# Patient Record
Sex: Female | Born: 1995 | Race: Black or African American | Hispanic: No | Smoking: Current some day smoker
Health system: Southern US, Community
[De-identification: ages and names within clinical notes are randomized; demographics above are authoritative.]

## PROBLEM LIST (undated history)

## (undated) DIAGNOSIS — R569 Unspecified convulsions: Secondary | ICD-10-CM

## (undated) DIAGNOSIS — H547 Unspecified visual loss: Secondary | ICD-10-CM

## (undated) DIAGNOSIS — H409 Unspecified glaucoma: Secondary | ICD-10-CM

## (undated) DIAGNOSIS — Q150 Congenital glaucoma: Secondary | ICD-10-CM

## (undated) DIAGNOSIS — E876 Hypokalemia: Secondary | ICD-10-CM

## (undated) HISTORY — DX: Unspecified glaucoma: H40.9

## (undated) HISTORY — PX: EYE SURGERY: SHX253

---

## 2019-09-17 ENCOUNTER — Emergency Department (HOSPITAL_COMMUNITY)
Admission: EM | Admit: 2019-09-17 | Discharge: 2019-09-17 | Disposition: A | Discharge status: Home / Self Care | Payer: Medicaid - Out of State | Attending: Emergency Medicine | Admitting: Emergency Medicine

## 2019-09-17 ENCOUNTER — Encounter (HOSPITAL_COMMUNITY): Payer: Self-pay

## 2019-09-17 ENCOUNTER — Other Ambulatory Visit: Payer: Self-pay

## 2019-09-17 DIAGNOSIS — Z0279 Encounter for issue of other medical certificate: Secondary | ICD-10-CM | POA: Insufficient documentation

## 2019-09-17 DIAGNOSIS — R4182 Altered mental status, unspecified: Secondary | ICD-10-CM | POA: Diagnosis present

## 2019-09-17 DIAGNOSIS — F10920 Alcohol use, unspecified with intoxication, uncomplicated: Secondary | ICD-10-CM | POA: Insufficient documentation

## 2019-09-17 HISTORY — DX: Unspecified visual loss: H54.7

## 2019-09-17 LAB — RAPID URINE DRUG SCREEN, HOSP PERFORMED
Amphetamines: NOT DETECTED
Barbiturates: NOT DETECTED
Benzodiazepines: POSITIVE — AB
Cocaine: NOT DETECTED
Opiates: NOT DETECTED
Tetrahydrocannabinol: NOT DETECTED

## 2019-09-17 LAB — CBC
HCT: 32.1 % — ABNORMAL LOW (ref 36.0–46.0)
Hemoglobin: 9.4 g/dL — ABNORMAL LOW (ref 12.0–15.0)
MCH: 21.4 pg — ABNORMAL LOW (ref 26.0–34.0)
MCHC: 29.3 g/dL — ABNORMAL LOW (ref 30.0–36.0)
MCV: 73.1 fL — ABNORMAL LOW (ref 80.0–100.0)
Platelets: 441 10*3/uL — ABNORMAL HIGH (ref 150–400)
RBC: 4.39 MIL/uL (ref 3.87–5.11)
RDW: 19.3 % — ABNORMAL HIGH (ref 11.5–15.5)
WBC: 6.5 10*3/uL (ref 4.0–10.5)
nRBC: 0 % (ref 0.0–0.2)

## 2019-09-17 LAB — COMPREHENSIVE METABOLIC PANEL
ALT: 14 U/L (ref 0–44)
AST: 22 U/L (ref 15–41)
Albumin: 4.1 g/dL (ref 3.5–5.0)
Alkaline Phosphatase: 69 U/L (ref 38–126)
Anion gap: 10 (ref 5–15)
BUN: 11 mg/dL (ref 6–20)
CO2: 24 mmol/L (ref 22–32)
Calcium: 8.9 mg/dL (ref 8.9–10.3)
Chloride: 107 mmol/L (ref 98–111)
Creatinine, Ser: 0.52 mg/dL (ref 0.44–1.00)
GFR calc Af Amer: >60 mL/min (ref >60)
GFR calc non Af Amer: >60 mL/min (ref >60)
Glucose, Bld: 99 mg/dL (ref 70–99)
Potassium: 3.1 mmol/L — ABNORMAL LOW (ref 3.5–5.1)
Sodium: 141 mmol/L (ref 135–145)
Total Bilirubin: 0.2 mg/dL — ABNORMAL LOW (ref 0.3–1.2)
Total Protein: 7.7 g/dL (ref 6.5–8.1)

## 2019-09-17 LAB — I-STAT BETA HCG BLOOD, ED (MC, WL, AP ONLY): I-stat hCG, quantitative: <5 m[IU]/mL (ref <5)

## 2019-09-17 LAB — CBG MONITORING, ED: Glucose-Capillary: 104 mg/dL — ABNORMAL HIGH (ref 70–99)

## 2019-09-17 LAB — ACETAMINOPHEN LEVEL: Acetaminophen (Tylenol), Serum: <10 ug/mL — ABNORMAL LOW (ref 10–30)

## 2019-09-17 LAB — ETHANOL: Alcohol, Ethyl (B): 204 mg/dL — ABNORMAL HIGH (ref <10)

## 2019-09-17 LAB — SALICYLATE LEVEL: Salicylate Lvl: <7 mg/dL — ABNORMAL LOW (ref 7.0–30.0)

## 2019-09-17 MED ORDER — SODIUM CHLORIDE 0.9 % IV BOLUS (SEPSIS)
1000.0000 mL | Freq: Once | INTRAVENOUS | Status: AC
  Administered 2019-09-17: 1000 mL via INTRAVENOUS

## 2019-09-17 MED ORDER — SODIUM CHLORIDE 0.9 % IV SOLN
1000.0000 mL | INTRAVENOUS | Status: DC
  Administered 2019-09-17: 1000 mL via INTRAVENOUS

## 2019-09-17 NOTE — ED Provider Notes (Signed)
7:30 AM-checkout from Dr. Eudelia Bunch, to evaluate patient for sobriety following a period of observation.  Patient reported drinking alcohol, EMS summoned, she was sedated for abnormal behavior which was apparently violent.  Clinical Course as of Sep 16 1057  Sun Sep 17, 2019  1057 Normal  I-Stat beta hCG blood, ED [EW]  1057 Normal except potassium low  Comprehensive metabolic panel(!) [EW]  1057 Normal  Salicylate level(!) [EW]  1057 Normal except hemoglobin low, MCV low, RBC indices abnormal, and platelets mildly elevated  cbc(!) [EW]  1057 Normal  Acetaminophen level(!) [EW]  1057 Abnormal, elevated  Ethanol(!) [EW]  1058 Normal except presence of benzodiazepines.  Rapid urine drug screen (hospital performed)(!) [EW]    Clinical Course User Index [EW] Mancel Bale, MD   11:20 AM-at this time she is sitting up, alert and conversant with normal vital signs.  She states that her "girlfriend," is here and wants to take her home.  The girlfriend is here, and appears sober and states that she will take care of the patient.  Patient will be discharged at this time.   Mancel Bale, MD 09/17/19 1121
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
## 2019-09-17 NOTE — Discharge Instructions (Addendum)
Avoid drinking large amounts of alcohol

## 2019-09-17 NOTE — ED Notes (Signed)
Patient has been calm and cooperative since being here. MD notified of patient's behavior. MD stated that the restraints placed on patient by EMS may be removed.

## 2019-12-23 ENCOUNTER — Other Ambulatory Visit: Payer: Self-pay

## 2019-12-23 ENCOUNTER — Emergency Department (HOSPITAL_COMMUNITY)
Admission: EM | Admit: 2019-12-23 | Discharge: 2019-12-23 | Disposition: A | Discharge status: Home / Self Care | Payer: Medicaid - Out of State | Attending: Emergency Medicine | Admitting: Emergency Medicine

## 2019-12-23 ENCOUNTER — Encounter (HOSPITAL_COMMUNITY): Payer: Self-pay

## 2019-12-23 DIAGNOSIS — Z20822 Contact with and (suspected) exposure to covid-19: Secondary | ICD-10-CM | POA: Diagnosis not present

## 2019-12-23 DIAGNOSIS — F10929 Alcohol use, unspecified with intoxication, unspecified: Secondary | ICD-10-CM | POA: Diagnosis not present

## 2019-12-23 DIAGNOSIS — R4689 Other symptoms and signs involving appearance and behavior: Secondary | ICD-10-CM

## 2019-12-23 DIAGNOSIS — F1020 Alcohol dependence, uncomplicated: Secondary | ICD-10-CM

## 2019-12-23 DIAGNOSIS — F13929 Sedative, hypnotic or anxiolytic use, unspecified with intoxication, unspecified: Secondary | ICD-10-CM | POA: Diagnosis not present

## 2019-12-23 DIAGNOSIS — E876 Hypokalemia: Secondary | ICD-10-CM

## 2019-12-23 DIAGNOSIS — F10920 Alcohol use, unspecified with intoxication, uncomplicated: Secondary | ICD-10-CM

## 2019-12-23 LAB — COMPREHENSIVE METABOLIC PANEL
ALT: 13 U/L (ref 0–44)
AST: 19 U/L (ref 15–41)
Albumin: 4 g/dL (ref 3.5–5.0)
Alkaline Phosphatase: 65 U/L (ref 38–126)
Anion gap: 15 (ref 5–15)
BUN: 8 mg/dL (ref 6–20)
CO2: 21 mmol/L — ABNORMAL LOW (ref 22–32)
Calcium: 9 mg/dL (ref 8.9–10.3)
Chloride: 106 mmol/L (ref 98–111)
Creatinine, Ser: 0.87 mg/dL (ref 0.44–1.00)
GFR, Estimated: >60 mL/min (ref >60)
Glucose, Bld: 129 mg/dL — ABNORMAL HIGH (ref 70–99)
Potassium: 2.6 mmol/L — CL (ref 3.5–5.1)
Sodium: 142 mmol/L (ref 135–145)
Total Bilirubin: 0.5 mg/dL (ref 0.3–1.2)
Total Protein: 7.2 g/dL (ref 6.5–8.1)

## 2019-12-23 LAB — RESPIRATORY PANEL BY RT PCR (FLU A&B, COVID)
Influenza A by PCR: NEGATIVE
Influenza B by PCR: NEGATIVE
SARS Coronavirus 2 by RT PCR: NEGATIVE

## 2019-12-23 LAB — CBC
HCT: 36.8 % (ref 36.0–46.0)
Hemoglobin: 11.6 g/dL — ABNORMAL LOW (ref 12.0–15.0)
MCH: 26 pg (ref 26.0–34.0)
MCHC: 31.5 g/dL (ref 30.0–36.0)
MCV: 82.3 fL (ref 80.0–100.0)
Platelets: 264 10*3/uL (ref 150–400)
RBC: 4.47 MIL/uL (ref 3.87–5.11)
RDW: 19.9 % — ABNORMAL HIGH (ref 11.5–15.5)
WBC: 6.6 10*3/uL (ref 4.0–10.5)
nRBC: 0 % (ref 0.0–0.2)

## 2019-12-23 LAB — RAPID URINE DRUG SCREEN, HOSP PERFORMED
Amphetamines: NOT DETECTED
Barbiturates: NOT DETECTED
Benzodiazepines: POSITIVE — AB
Cocaine: NOT DETECTED
Opiates: NOT DETECTED
Tetrahydrocannabinol: POSITIVE — AB

## 2019-12-23 LAB — I-STAT BETA HCG BLOOD, ED (MC, WL, AP ONLY): I-stat hCG, quantitative: <5 m[IU]/mL (ref <5)

## 2019-12-23 LAB — ETHANOL: Alcohol, Ethyl (B): 327 mg/dL (ref <10)

## 2019-12-23 LAB — SALICYLATE LEVEL: Salicylate Lvl: <7 mg/dL — ABNORMAL LOW (ref 7.0–30.0)

## 2019-12-23 LAB — ACETAMINOPHEN LEVEL: Acetaminophen (Tylenol), Serum: <10 ug/mL — ABNORMAL LOW (ref 10–30)

## 2019-12-23 MED ORDER — POTASSIUM CHLORIDE CRYS ER 20 MEQ PO TBCR: 20.0000 meq | EXTENDED_RELEASE_TABLET | Freq: Two times a day (BID) | ORAL | 0 refills | Status: DC

## 2019-12-23 MED ORDER — POTASSIUM CHLORIDE 10 MEQ/100ML IV SOLN
10.0000 meq | Freq: Once | INTRAVENOUS | Status: AC
  Administered 2019-12-23: 10 meq via INTRAVENOUS
  Filled 2019-12-23: qty 100

## 2019-12-23 MED ORDER — POTASSIUM CHLORIDE CRYS ER 20 MEQ PO TBCR
40.0000 meq | EXTENDED_RELEASE_TABLET | ORAL | Status: DC
  Administered 2019-12-23: 40 meq via ORAL
  Filled 2019-12-23: qty 2

## 2019-12-23 NOTE — ED Notes (Signed)
Pt awake in bed. Asking to go home. Dr made aware. Respirations even and unlabored. Denies any needs at this time. Will continue to monitor.

## 2019-12-23 NOTE — ED Notes (Signed)
Date and time results received: 12/23/19 2:38 AM  Test: ETOH Critical Value: 327  Test: Potassium  Critical Value: 2.6  Name of Provider Notified: Delo, EDP

## 2019-12-23 NOTE — ED Triage Notes (Signed)
Patient BIB [facility]. History of psych issues.  EMS states whenever she drinks she starts talking out of her head and does not make sense. Patient is blind in both eyes. She got combative with EMS, biting, grabbing and they gave her 5mg  Haldol and 5mg  Versed and put her in 4 point restraints. Patient says she takes Xanax and Clonopin.   EMS Vitals 128/68 HR 105 98% Room Air

## 2019-12-23 NOTE — ED Provider Notes (Signed)
10:30 AM-patient asked to be reevaluated.  At this time she is sitting up, alert and conversant.  She stated she did not understand why she was so intoxicated and we discussed how the intake is the likely reason.  She agrees that she drinks too much.  She is willing to take potassium if I give her a prescription.  She states a friend is coming to pick her up.  She otherwise has no concerns and is cooperative and interactive at this time.   Mancel Bale, MD 12/23/19 1036

## 2019-12-23 NOTE — ED Notes (Signed)
Pt was released from ankle restraints at this time. Wrist restraints remain in place. Toileting offered. Pt back asleep, calm and cooperative at this time.

## 2019-12-23 NOTE — ED Provider Notes (Signed)
[facility]-EMERGENCY DEPT Provider Note   CSN: [id number] Arrival date & time: 12/23/19  0109     History No chief complaint on file.   Beth Barnes is a 24 y.o. female.  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 She is brought by EMS from home for evaluation of aggressive behavior. She was apparently drinking alcohol and taking benzodiazepines this evening when EMS was contacted. She was combative with EMS and required chemical sedation with Haldol and Versed followed by four-point restraints. Patient arrives here somnolent and apparently intoxicated. No additional history from the patient secondary to intoxication/mental status.  The history is provided by the patient.       Past Medical History:  Diagnosis Date  . Blind   . Glaucoma     There are no problems to display for this patient.   History reviewed. No pertinent surgical history.   OB History   No obstetric history on file.     History reviewed. No pertinent family history.  Social History   Tobacco Use  . Smoking status: Never Smoker  . Smokeless tobacco: Never Used  Vaping Use  . Vaping Use: Never used  Substance Use Topics  . Alcohol use: Yes    Alcohol/week: 3.0 standard drinks    Types: 3 Shots of liquor per week    Comment: Every other day  . Drug use: Not Currently    Home Medications Prior to Admission medications   Medication Sig Start Date End Date Taking? Authorizing Provider  clonazePAM (KLONOPIN) 0.5 MG tablet Take 0.5 mg by mouth daily as needed. 11/22/19   [provider]  propranolol (INDERAL) 40 MG tablet Take by mouth at bedtime. 11/16/19   [provider]    Allergies    Cetirizine & related  Review of Systems   Review of Systems  All other systems reviewed and are negative.   Physical Exam Updated Vital Signs BP 116/67   Pulse (!) 103   Temp (!) 97 F (36.1 C) (Axillary)   Resp 18    Ht 5\' 2"  (1.575 m)   Wt 68 kg   SpO2 98%   BMI 27.42 kg/m   Physical Exam Vitals and nursing note reviewed.  Constitutional:      General: She is not in acute distress.    Appearance: She is well-developed. She is not diaphoretic.     Comments: Patient is somnolent. She does not arouse to voice, but will localize to noxious stimuli.  HENT:     Head: Normocephalic and atraumatic.  Cardiovascular:     Rate and Rhythm: Normal rate and regular rhythm.     Heart sounds: No murmur heard.  No friction rub. No gallop.   Pulmonary:     Effort: Pulmonary effort is normal. No respiratory distress.     Breath sounds: Normal breath sounds. No wheezing.  Abdominal:     General: Bowel sounds are normal. There is no distension.     Palpations: Abdomen is soft.     Tenderness: There is no abdominal tenderness.  Musculoskeletal:        General: Normal range of motion.     Cervical back: Normal range of motion and neck supple.  Skin:    General: Skin is warm and dry.  Neurological:     Mental Status: She is alert.     ED Results / Procedures / Treatments   Labs (all labs ordered are listed, but only  abnormal results are displayed) Labs Reviewed  COMPREHENSIVE METABOLIC PANEL - Abnormal; Notable for the following components:      Result Value   Potassium 2.6 (*)    CO2 21 (*)    Glucose, Bld 129 (*)    All other components within normal limits  ETHANOL - Abnormal; Notable for the following components:   Alcohol, Ethyl (B) 327 (*)    All other components within normal limits  SALICYLATE LEVEL - Abnormal; Notable for the following components:   Salicylate Lvl <7.0 (*)    All other components within normal limits  ACETAMINOPHEN LEVEL - Abnormal; Notable for the following components:   Acetaminophen (Tylenol), Serum <10 (*)    All other components within normal limits  CBC - Abnormal; Notable for the following components:   Hemoglobin 11.6 (*)    RDW 19.9 (*)    All other components  within normal limits  RESPIRATORY PANEL BY RT PCR (FLU A&B, COVID)  RAPID URINE DRUG SCREEN, HOSP PERFORMED  I-STAT BETA HCG BLOOD, ED ([facility], [facility], [facility] ONLY)    EKG None  Radiology No results found.  Procedures Procedures (including critical care time)  Medications Ordered in ED Medications - No data to display  ED Course  I have reviewed the triage vital signs and the nursing notes.  Pertinent labs & imaging results that were available during my care of the patient were reviewed by me and considered in my medical decision making (see chart for details).    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  She was apparently aggressive toward family members.  She was combative with EMS and required physical and chemical restraint.  Patient has been somnolent throughout her emergency department course.  She is heavily intoxicated with blood alcohol of 327.  Urinalysis is also positive for benzodiazepines and marijuana.  Laboratory studies are otherwise unremarkable.  Patient has remained hemodynamically stable throughout her emergency department course.  I have reassessed her on several occasions, however remained somnolent.  I feel as though she will require more time to metabolize, but anticipate discharge.  She did have a low potassium and a potassium infusion was ordered.  Final Clinical Impression(s) / ED Diagnoses Final diagnoses:  None    Rx / DC Orders ED Discharge Orders    None       Geoffery Lyons, MD 12/23/19 [phone]

## 2019-12-23 NOTE — Discharge Instructions (Signed)
It is important to avoid drinking heavy amounts of alcohol because you are an alcoholic, and were very intoxicated tonight.  Our testing indicated that you have low potassium and this will need to be treated with a prescription.  Take this medicine as directed.  We sent one to your pharmacy.  You can also try to eat foods which contain more potassium, and we have supplied some information about that.  Follow-up with the doctor of your choice for a recheck of your blood potassium in 2 or 3 weeks.

## 2019-12-23 NOTE — ED Notes (Signed)
Pt refusing IV for potassium

## 2019-12-23 NOTE — ED Notes (Signed)
Pt refused IV. Primary RN notified

## 2020-02-01 ENCOUNTER — Emergency Department (HOSPITAL_COMMUNITY): Payer: Medicaid - Out of State

## 2020-02-01 ENCOUNTER — Emergency Department (HOSPITAL_COMMUNITY)
Admission: EM | Admit: 2020-02-01 | Discharge: 2020-02-01 | Disposition: A | Discharge status: Home / Self Care | Payer: Medicaid - Out of State | Attending: Emergency Medicine | Admitting: Emergency Medicine

## 2020-02-01 ENCOUNTER — Other Ambulatory Visit: Payer: Self-pay

## 2020-02-01 ENCOUNTER — Encounter (HOSPITAL_COMMUNITY): Payer: Self-pay

## 2020-02-01 DIAGNOSIS — R109 Unspecified abdominal pain: Secondary | ICD-10-CM

## 2020-02-01 DIAGNOSIS — R112 Nausea with vomiting, unspecified: Secondary | ICD-10-CM | POA: Diagnosis not present

## 2020-02-01 DIAGNOSIS — R1032 Left lower quadrant pain: Secondary | ICD-10-CM | POA: Insufficient documentation

## 2020-02-01 HISTORY — DX: Hypokalemia: E87.6

## 2020-02-01 LAB — COMPREHENSIVE METABOLIC PANEL
ALT: 19 U/L (ref 0–44)
AST: 33 U/L (ref 15–41)
Albumin: 4.3 g/dL (ref 3.5–5.0)
Alkaline Phosphatase: 64 U/L (ref 38–126)
Anion gap: 15 (ref 5–15)
BUN: 13 mg/dL (ref 6–20)
CO2: 21 mmol/L — ABNORMAL LOW (ref 22–32)
Calcium: 8.8 mg/dL — ABNORMAL LOW (ref 8.9–10.3)
Chloride: 105 mmol/L (ref 98–111)
Creatinine, Ser: 0.55 mg/dL (ref 0.44–1.00)
GFR, Estimated: >60 mL/min (ref >60)
Glucose, Bld: 72 mg/dL (ref 70–99)
Potassium: 3.8 mmol/L (ref 3.5–5.1)
Sodium: 141 mmol/L (ref 135–145)
Total Bilirubin: 0.6 mg/dL (ref 0.3–1.2)
Total Protein: 8.1 g/dL (ref 6.5–8.1)

## 2020-02-01 LAB — CBC
HCT: 43.1 % (ref 36.0–46.0)
Hemoglobin: 13.6 g/dL (ref 12.0–15.0)
MCH: 26.9 pg (ref 26.0–34.0)
MCHC: 31.6 g/dL (ref 30.0–36.0)
MCV: 85.3 fL (ref 80.0–100.0)
Platelets: 570 10*3/uL — ABNORMAL HIGH (ref 150–400)
RBC: 5.05 MIL/uL (ref 3.87–5.11)
RDW: 19.7 % — ABNORMAL HIGH (ref 11.5–15.5)
WBC: 7.7 10*3/uL (ref 4.0–10.5)
nRBC: 0 % (ref 0.0–0.2)

## 2020-02-01 LAB — URINALYSIS, ROUTINE W REFLEX MICROSCOPIC
Bilirubin Urine: NEGATIVE
Glucose, UA: NEGATIVE mg/dL
Hgb urine dipstick: NEGATIVE
Ketones, ur: 20 mg/dL — AB
Leukocytes,Ua: NEGATIVE
Nitrite: NEGATIVE
Protein, ur: 30 mg/dL — AB
Specific Gravity, Urine: 1.026 (ref 1.005–1.030)
pH: 7 (ref 5.0–8.0)

## 2020-02-01 LAB — I-STAT BETA HCG BLOOD, ED (MC, WL, AP ONLY): I-stat hCG, quantitative: <5 m[IU]/mL (ref <5)

## 2020-02-01 LAB — LIPASE, BLOOD: Lipase: 17 U/L (ref 11–51)

## 2020-02-01 MED ORDER — ONDANSETRON HCL 4 MG/2ML IJ SOLN
4.0000 mg | Freq: Once | INTRAMUSCULAR | Status: AC
  Administered 2020-02-01: 15:00:00 4 mg via INTRAVENOUS
  Filled 2020-02-01: qty 2

## 2020-02-01 MED ORDER — ONDANSETRON 4 MG PO TBDP: 4.0000 mg | ORAL_TABLET | Freq: Three times a day (TID) | ORAL | 0 refills | Status: DC | PRN

## 2020-02-01 MED ORDER — ACETAMINOPHEN 325 MG PO TABS
650.0000 mg | ORAL_TABLET | Freq: Once | ORAL | Status: DC
  Filled 2020-02-01: qty 2

## 2020-02-01 MED ORDER — PANTOPRAZOLE SODIUM 40 MG PO TBEC: 40.0000 mg | DELAYED_RELEASE_TABLET | Freq: Every day | ORAL | 0 refills | Status: DC

## 2020-02-01 MED ORDER — FAMOTIDINE 20 MG PO TABS: 20.0000 mg | ORAL_TABLET | Freq: Two times a day (BID) | ORAL | 0 refills | Status: DC

## 2020-02-01 MED ORDER — KETOROLAC TROMETHAMINE 15 MG/ML IJ SOLN
15.0000 mg | Freq: Once | INTRAMUSCULAR | Status: AC
  Administered 2020-02-01: 15:00:00 15 mg via INTRAVENOUS
  Filled 2020-02-01: qty 1

## 2020-02-01 NOTE — ED Provider Notes (Signed)
[facility]-EMERGENCY DEPT Provider Note   CSN: [id number] Arrival date & time: 02/01/20  1045     History Chief Complaint  Patient presents with  . Nausea  . Abdominal Pain    Beth Barnes is a 24 y.o. female.  Patient presents with nausea vomiting and left flank/left lower quadrant pain.  Sharp and aching nonradiating.  Ongoing since yesterday.  No fevers no cough no vomiting or diarrhea.  No prior history of kidney stones.        Past Medical History:  Diagnosis Date  . Blind   . Glaucoma   . Hypokalemia     There are no problems to display for this patient.   Past Surgical History:  Procedure Laterality Date  . EYE SURGERY       OB History   No obstetric history on file.     Family History  Problem Relation Age of Onset  . Healthy Mother   . Healthy Father     Social History   Tobacco Use  . Smoking status: Never Smoker  . Smokeless tobacco: Never Used  Vaping Use  . Vaping Use: Never used  Substance Use Topics  . Alcohol use: Yes    Alcohol/week: 3.0 standard drinks    Types: 3 Shots of liquor per week    Comment: Every other day  . Drug use: Not Currently    Home Medications Prior to Admission medications   Medication Sig Start Date End Date Taking? Authorizing Provider  clonazePAM (KLONOPIN) 0.5 MG tablet Take 0.5 mg by mouth daily as needed. 11/22/19   [provider]  potassium chloride SA (KLOR-CON) 20 MEQ tablet Take 1 tablet (20 mEq total) by mouth 2 (two) times daily. 12/23/19   Mancel Bale, MD  propranolol (INDERAL) 40 MG tablet Take by mouth at bedtime. 11/16/19   [provider]    Allergies    Cetirizine & related  Review of Systems   Review of Systems  Constitutional: Negative for fever.  HENT: Negative for ear pain.   Eyes: Negative for pain.  Respiratory: Negative for cough.   Cardiovascular: Negative for chest pain.  Gastrointestinal: Negative for abdominal pain.   Genitourinary: Positive for flank pain.  Musculoskeletal: Negative for back pain.  Skin: Negative for rash.  Neurological: Negative for headaches.    Physical Exam Updated Vital Signs BP (!) 122/92 (BP Location: Left Arm)   Pulse 84   Temp 98.1 F (36.7 C) (Oral)   Resp 15   Ht 5\' 6"  (1.676 m)   Wt 69.9 kg   LMP 01/30/2020   SpO2 100%   BMI 24.86 kg/m   Physical Exam Constitutional:      General: She is not in acute distress.    Appearance: Normal appearance.  HENT:     Head: Normocephalic.     Nose: Nose normal.  Eyes:     Extraocular Movements: Extraocular movements intact.  Cardiovascular:     Rate and Rhythm: Normal rate.  Pulmonary:     Effort: Pulmonary effort is normal.  Abdominal:     Palpations: Abdomen is soft.     Tenderness: There is no abdominal tenderness.  Musculoskeletal:        General: Normal range of motion.     Cervical back: Normal range of motion.  Skin:    General: Skin is warm.  Neurological:     General: No focal deficit present.     Mental Status: She is alert.  ED Results / Procedures / Treatments   Labs (all labs ordered are listed, but only abnormal results are displayed) Labs Reviewed  COMPREHENSIVE METABOLIC PANEL - Abnormal; Notable for the following components:      Result Value   CO2 21 (*)    Calcium 8.8 (*)    All other components within normal limits  CBC - Abnormal; Notable for the following components:   RDW 19.7 (*)    Platelets 570 (*)    All other components within normal limits  LIPASE, BLOOD  URINALYSIS, ROUTINE W REFLEX MICROSCOPIC  I-STAT BETA HCG BLOOD, ED ([facility], [facility], [facility] ONLY)    EKG None  Radiology CT Abdomen Pelvis Wo Contrast  Result Date: 02/01/2020 CLINICAL DATA:  Bilateral flank pain EXAM: CT ABDOMEN AND PELVIS WITHOUT CONTRAST TECHNIQUE: Multidetector CT imaging of the abdomen and pelvis was performed following the standard protocol without IV contrast. COMPARISON:  None. FINDINGS: Lower  chest: No acute abnormality. Hepatobiliary: No focal liver abnormality is seen. No gallstones, gallbladder wall thickening, or biliary dilatation. Pancreas: Unremarkable. No pancreatic ductal dilatation or surrounding inflammatory changes. Spleen: Normal in size without focal abnormality. Adrenals/Urinary Tract: Adrenal glands are within normal limits bilaterally. Kidneys demonstrate no renal calculi or urinary tract obstructive changes. Bladder is well distended. Stomach/Bowel: No obstructive or inflammatory changes of the colon are seen. The appendix is within normal limits. Small bowel and stomach appear within normal limits. Vascular/Lymphatic: No significant vascular findings are present. No enlarged abdominal or pelvic lymph nodes. Reproductive: Uterus and bilateral adnexa are unremarkable. Menstrual cup is noted in place. Other: No abdominal wall hernia or abnormality. No abdominopelvic ascites. Musculoskeletal: No acute or significant osseous findings. IMPRESSION: No evidence of renal calculi or obstructive changes. No acute abnormality noted. Electronically Signed   By: Alcide Clever M.D.   On: 02/01/2020 14:56    Procedures Procedures (including critical care time)  Medications Ordered in ED Medications  ketorolac (TORADOL) 15 MG/ML injection 15 mg (has no administration in time range)  acetaminophen (TYLENOL) tablet 650 mg (has no administration in time range)    ED Course  I have reviewed the triage vital signs and the nursing notes.  Pertinent labs & imaging results that were available during my care of the patient were reviewed by me and considered in my medical decision making (see chart for details).    MDM Rules/Calculators/A&P                          Unremarkable white count 7 hemoglobin 13 chemistry normal.  Urinalysis sent and pending.  Pregnancy test is negative otherwise.  CT imaging pursued.  No acute findings noted.  Pending patient urinalysis.     Final Clinical  Impression(s) / ED Diagnoses Final diagnoses:  Flank pain    Rx / DC Orders ED Discharge Orders    None       Cheryll Cockayne, MD 02/01/20 1505

## 2020-02-01 NOTE — ED Triage Notes (Signed)
Patient c/o nausea and body aches x 2 days.  Patent is blind

## 2020-02-01 NOTE — Discharge Instructions (Signed)
If you develop worsening, continued, or recurrent abdominal pain, uncontrolled vomiting, fever, chest or back pain, or any other new/concerning symptoms then return to the ER for evaluation.  

## 2020-02-01 NOTE — ED Provider Notes (Signed)
Patient's urinalysis is unremarkable besides some mild ketones.  CT without obvious pathology.  Still has some upper abdominal discomfort, will place on PPI, H2 blocker and give Zofran.  Discussed return precautions.   Pricilla Loveless, MD 02/01/20 [phone]

## 2020-07-28 ENCOUNTER — Emergency Department (HOSPITAL_COMMUNITY)
Admission: EM | Admit: 2020-07-28 | Discharge: 2020-07-28 | Disposition: A | Discharge status: Home / Self Care | Payer: Medicaid - Out of State | Attending: Emergency Medicine | Admitting: Emergency Medicine

## 2020-07-28 ENCOUNTER — Other Ambulatory Visit: Payer: Self-pay

## 2020-07-28 ENCOUNTER — Emergency Department (HOSPITAL_COMMUNITY): Payer: Medicaid - Out of State

## 2020-07-28 ENCOUNTER — Encounter (HOSPITAL_COMMUNITY): Payer: Self-pay | Admitting: Emergency Medicine

## 2020-07-28 DIAGNOSIS — R1010 Upper abdominal pain, unspecified: Secondary | ICD-10-CM | POA: Insufficient documentation

## 2020-07-28 DIAGNOSIS — R112 Nausea with vomiting, unspecified: Secondary | ICD-10-CM | POA: Insufficient documentation

## 2020-07-28 DIAGNOSIS — E876 Hypokalemia: Secondary | ICD-10-CM | POA: Diagnosis not present

## 2020-07-28 HISTORY — DX: Congenital glaucoma: Q15.0

## 2020-07-28 LAB — COMPREHENSIVE METABOLIC PANEL
ALT: 39 U/L (ref 0–44)
AST: 48 U/L — ABNORMAL HIGH (ref 15–41)
Albumin: 3.4 g/dL — ABNORMAL LOW (ref 3.5–5.0)
Alkaline Phosphatase: 70 U/L (ref 38–126)
Anion gap: 12 (ref 5–15)
BUN: 6 mg/dL (ref 6–20)
CO2: 26 mmol/L (ref 22–32)
Calcium: 8.4 mg/dL — ABNORMAL LOW (ref 8.9–10.3)
Chloride: 99 mmol/L (ref 98–111)
Creatinine, Ser: 0.78 mg/dL (ref 0.44–1.00)
GFR, Estimated: >60 mL/min (ref >60)
Glucose, Bld: 80 mg/dL (ref 70–99)
Potassium: 2.5 mmol/L — CL (ref 3.5–5.1)
Sodium: 137 mmol/L (ref 135–145)
Total Bilirubin: 2.2 mg/dL — ABNORMAL HIGH (ref 0.3–1.2)
Total Protein: 6.7 g/dL (ref 6.5–8.1)

## 2020-07-28 LAB — URINALYSIS, MICROSCOPIC (REFLEX): Bacteria, UA: NONE SEEN

## 2020-07-28 LAB — CBC WITH DIFFERENTIAL/PLATELET
Abs Immature Granulocytes: 0.02 10*3/uL (ref 0.00–0.07)
Basophils Absolute: 0 10*3/uL (ref 0.0–0.1)
Basophils Relative: 0 %
Eosinophils Absolute: 0 10*3/uL (ref 0.0–0.5)
Eosinophils Relative: 0 %
HCT: 39.5 % (ref 36.0–46.0)
Hemoglobin: 12.7 g/dL (ref 12.0–15.0)
Immature Granulocytes: 0 %
Lymphocytes Relative: 17 %
Lymphs Abs: 1.2 10*3/uL (ref 0.7–4.0)
MCH: 31.8 pg (ref 26.0–34.0)
MCHC: 32.2 g/dL (ref 30.0–36.0)
MCV: 99 fL (ref 80.0–100.0)
Monocytes Absolute: 0.6 10*3/uL (ref 0.1–1.0)
Monocytes Relative: 9 %
Neutro Abs: 5.3 10*3/uL (ref 1.7–7.7)
Neutrophils Relative %: 74 %
Platelets: 325 10*3/uL (ref 150–400)
RBC: 3.99 MIL/uL (ref 3.87–5.11)
RDW: 15.7 % — ABNORMAL HIGH (ref 11.5–15.5)
WBC: 7.2 10*3/uL (ref 4.0–10.5)
nRBC: 0 % (ref 0.0–0.2)

## 2020-07-28 LAB — URINALYSIS, ROUTINE W REFLEX MICROSCOPIC
Glucose, UA: 100 mg/dL — AB
Hgb urine dipstick: NEGATIVE
Ketones, ur: >80 mg/dL — AB
Leukocytes,Ua: NEGATIVE
Nitrite: POSITIVE — AB
Protein, ur: 100 mg/dL — AB
Specific Gravity, Urine: 1.025 (ref 1.005–1.030)
pH: 6.5 (ref 5.0–8.0)

## 2020-07-28 LAB — I-STAT BETA HCG BLOOD, ED (MC, WL, AP ONLY): I-stat hCG, quantitative: <5 m[IU]/mL (ref <5)

## 2020-07-28 LAB — LIPASE, BLOOD: Lipase: 36 U/L (ref 11–51)

## 2020-07-28 MED ORDER — ONDANSETRON 4 MG PO TBDP
4.0000 mg | ORAL_TABLET | Freq: Once | ORAL | Status: AC
  Administered 2020-07-28: 14:00:00 4 mg via ORAL
  Filled 2020-07-28: qty 1

## 2020-07-28 MED ORDER — SODIUM CHLORIDE 0.9 % IV SOLN
1000.0000 mL | INTRAVENOUS | Status: DC
  Administered 2020-07-28: 1000 mL via INTRAVENOUS

## 2020-07-28 MED ORDER — SODIUM CHLORIDE 0.9 % IV BOLUS (SEPSIS)
1000.0000 mL | Freq: Once | INTRAVENOUS | Status: AC
  Administered 2020-07-28: 15:00:00 1000 mL via INTRAVENOUS

## 2020-07-28 MED ORDER — ONDANSETRON HCL 4 MG/2ML IJ SOLN
4.0000 mg | Freq: Once | INTRAMUSCULAR | Status: AC
  Administered 2020-07-28: 4 mg via INTRAVENOUS
  Filled 2020-07-28: qty 2

## 2020-07-28 MED ORDER — POTASSIUM CHLORIDE CRYS ER 20 MEQ PO TBCR
40.0000 meq | EXTENDED_RELEASE_TABLET | Freq: Once | ORAL | Status: AC
  Administered 2020-07-28: 40 meq via ORAL
  Filled 2020-07-28: qty 2

## 2020-07-28 MED ORDER — POTASSIUM CHLORIDE 10 MEQ/100ML IV SOLN
10.0000 meq | INTRAVENOUS | Status: AC
  Administered 2020-07-28 (×2): 10 meq via INTRAVENOUS
  Filled 2020-07-28: qty 100

## 2020-07-28 NOTE — ED Provider Notes (Signed)
Emergency Medicine Provider Triage Evaluation Note  Beth Barnes , a 25 y.o. female  was evaluated in triage.  Pt complains of nausea, NBNB emesis, and abdominal pain. Has not had a BM however states that she has not been able to keep anything down. No fevers. LNMP sometime last month. Denies any recent sick contacts. No urinary symptoms. NO fevers or chills.   Review of Systems  Positive: + abdominal pain, nausea, vomiting, constipation Negative: - fevers, chills  Physical Exam  BP (!) 138/119 (BP Location: Left Arm)   Pulse 95   Temp 98.7 F (37.1 C)   Resp 16   Ht 5\' 2"  (1.575 m)   Wt 72.6 kg   LMP 07/09/2020 (Exact Date)   SpO2 96%   BMI 29.26 kg/m  Gen:   Awake, no distress   Resp:  Normal effort  MSK:   Moves extremities without difficulty  Other:  Diffuse upper abdominal TTP.   Medical Decision Making  Medically screening exam initiated at 1:04 PM.  Appropriate orders placed.  Beth Barnes was informed that the remainder of the evaluation will be completed by another provider, this initial triage assessment does not replace that evaluation, and the importance of remaining in the ED until their evaluation is complete.     07/11/2020, PA-C 07/28/20 1304    07/30/20, MD 07/28/20 [phone]

## 2020-07-28 NOTE — ED Provider Notes (Signed)
  Physical Exam  BP (!) 138/119 (BP Location: Left Arm)   Pulse 95   Temp 98.7 F (37.1 C)   Resp 16   Ht 1.575 m (5\' 2" )   Wt 72.6 kg   LMP 07/09/2020 (Exact Date)   SpO2 96%   BMI 29.26 kg/m   Physical Exam  ED Course/Procedures     Procedures  MDM  Hand off received from Dr. 07/11/2020. 3:43 PM Patient with abdominal pain, nausea and vomiting. Unable to keep anything down. Some ttp epigastric area. Hypokalemic Mildly elevated ast 2 runs potassium, antiemetics being given, and Lynelle Doctor pending. Plan reevaluation and will dispo as appropriate.   Patient is tolerating fluids. Ultrasound reviewed and no evidence of acute cholecystitis Plan outpatient potassium and antiemetics. Patient advised to return if she is not tolerating fluids.  She is advised to return if she has had worsening abdominal pain She is advised to have her potassium rechecked in 1 to 2 days as an outpatient.      [place], MD 07/28/20 [phone]

## 2020-07-28 NOTE — ED Provider Notes (Signed)
[facility] EMERGENCY DEPARTMENT Provider Note   CSN: [id number] Arrival date & time: 07/28/20  1247     History Chief Complaint  Patient presents with   Emesis   Abdominal Pain    Beth Barnes is a 25 y.o. female.   Emesis Associated symptoms: abdominal pain   Abdominal Pain Associated symptoms: vomiting    Patient presented to the ED for evaluation of nausea and upper abdominal pain.  Patient states the symptoms started in the last couple days.  She has not been able to keep anything down.  Whenever she tries to eat or drink she vomits.  The pain is in her upper abdomen and goes to her back.  She denies having any diarrhea.  She is not having any dysuria.  She denies any fevers or chills.  Patient also is having a sensation of numbness in her periumbilical area.  She also has complaints of numbness that goes up from the bottom of her leg towards the top on both sides.  She is not having any trouble with her gait balance or coordination  Past Medical History:  Diagnosis Date   Blind    Congenital glaucoma of both eyes    Glaucoma    Hypokalemia     There are no problems to display for this patient.   Past Surgical History:  Procedure Laterality Date   EYE SURGERY       OB History   No obstetric history on file.     Family History  Problem Relation Age of Onset   Healthy Mother    Healthy Father     Social History   Tobacco Use   Smoking status: Never   Smokeless tobacco: Never  Vaping Use   Vaping Use: Never used  Substance Use Topics   Alcohol use: Yes    Alcohol/week: 3.0 standard drinks    Types: 3 Shots of liquor per week    Comment: Every other day   Drug use: Not Currently    Home Medications Prior to Admission medications   Medication Sig Start Date End Date Taking? Authorizing Provider  clonazePAM (KLONOPIN) 0.5 MG tablet Take 0.5 mg by mouth daily as needed. 11/22/19   [provider]  famotidine (PEPCID) 20  MG tablet Take 1 tablet (20 mg total) by mouth 2 (two) times daily. 02/01/20   Pricilla Loveless, MD  ondansetron (ZOFRAN ODT) 4 MG disintegrating tablet Take 1 tablet (4 mg total) by mouth every 8 (eight) hours as needed for nausea or vomiting. 02/01/20   Pricilla Loveless, MD  pantoprazole (PROTONIX) 40 MG tablet Take 1 tablet (40 mg total) by mouth daily. 02/01/20   Pricilla Loveless, MD  potassium chloride SA (KLOR-CON) 20 MEQ tablet Take 1 tablet (20 mEq total) by mouth 2 (two) times daily. 12/23/19   Mancel Bale, MD  propranolol (INDERAL) 40 MG tablet Take by mouth at bedtime. 11/16/19   [provider]    Allergies    Cetirizine & related  Review of Systems   Review of Systems  Gastrointestinal:  Positive for abdominal pain and vomiting.  All other systems reviewed and are negative.  Physical Exam Updated Vital Signs BP (!) 138/119 (BP Location: Left Arm)   Pulse 95   Temp 98.7 F (37.1 C)   Resp 16   Ht 1.575 m (5\' 2" )   Wt 72.6 kg   LMP 07/09/2020 (Exact Date)   SpO2 96%   BMI 29.26 kg/m   Physical  Exam  ED Results / Procedures / Treatments   Labs (all labs ordered are listed, but only abnormal results are displayed) Labs Reviewed  COMPREHENSIVE METABOLIC PANEL  LIPASE, BLOOD  CBC WITH DIFFERENTIAL/PLATELET  URINALYSIS, ROUTINE W REFLEX MICROSCOPIC  I-STAT BETA HCG BLOOD, ED ([facility], [facility], [facility] ONLY)    EKG None  Radiology No results found.  Procedures Procedures   Medications Ordered in ED Medications  sodium chloride 0.9 % bolus 1,000 mL (has no administration in time range)    Followed by  0.9 %  sodium chloride infusion (has no administration in time range)  ondansetron (ZOFRAN) injection 4 mg (has no administration in time range)  ondansetron (ZOFRAN-ODT) disintegrating tablet 4 mg (4 mg Oral Given 07/28/20 1347)    ED Course  I have reviewed the triage vital signs and the nursing notes.  Pertinent labs & imaging results that were available  during my care of the patient were reviewed by me and considered in my medical decision making (see chart for details).    MDM Rules/Calculators/A&P                          Pt presented with upper abd pain, vomiting, paresthesias.  Sx concerning for possible, gastritis, biliary colic, hepatitis, pancreatitis.  Initial labs notable for hypokalemia.  IV and oral potassium, iv fluids, anitemetics ordered.  Will [place] to rule out biliary etiology.  Care turned over to Dr Rosalia Hammers Final Clinical Impression(s) / ED Diagnoses Final diagnoses:  Nausea and vomiting, intractability of vomiting not specified, unspecified vomiting type  Hypokalemia    Rx / DC Orders ED Discharge Orders     None        Linwood Dibbles, MD 07/30/20 [phone]

## 2020-07-28 NOTE — ED Triage Notes (Signed)
Pt here for vomiting and abd pain x2 days, reports feeling like her abdomen is numb. Pt is legally blind

## 2020-07-28 NOTE — Discharge Instructions (Addendum)
Please have your potassium rechecked in 1 to 2 days Return if you are having worsening abdominal pain or cannot tolerate liquids.

## 2020-11-30 ENCOUNTER — Emergency Department (HOSPITAL_COMMUNITY)
Admission: EM | Admit: 2020-11-30 | Discharge: 2020-11-30 | Disposition: A | Discharge status: Home / Self Care | Payer: Medicaid - Out of State | Attending: Emergency Medicine | Admitting: Emergency Medicine

## 2020-11-30 ENCOUNTER — Other Ambulatory Visit: Payer: Self-pay

## 2020-11-30 ENCOUNTER — Encounter (HOSPITAL_COMMUNITY): Payer: Self-pay | Admitting: Emergency Medicine

## 2020-11-30 ENCOUNTER — Emergency Department (HOSPITAL_COMMUNITY): Payer: Medicaid - Out of State

## 2020-11-30 DIAGNOSIS — R42 Dizziness and giddiness: Secondary | ICD-10-CM | POA: Diagnosis present

## 2020-11-30 DIAGNOSIS — R030 Elevated blood-pressure reading, without diagnosis of hypertension: Secondary | ICD-10-CM | POA: Insufficient documentation

## 2020-11-30 DIAGNOSIS — E876 Hypokalemia: Secondary | ICD-10-CM | POA: Diagnosis not present

## 2020-11-30 DIAGNOSIS — R000 Tachycardia, unspecified: Secondary | ICD-10-CM | POA: Insufficient documentation

## 2020-11-30 DIAGNOSIS — H81399 Other peripheral vertigo, unspecified ear: Secondary | ICD-10-CM | POA: Insufficient documentation

## 2020-11-30 DIAGNOSIS — R0602 Shortness of breath: Secondary | ICD-10-CM | POA: Diagnosis not present

## 2020-11-30 DIAGNOSIS — L2082 Flexural eczema: Secondary | ICD-10-CM | POA: Insufficient documentation

## 2020-11-30 DIAGNOSIS — R748 Abnormal levels of other serum enzymes: Secondary | ICD-10-CM

## 2020-11-30 LAB — CBC WITH DIFFERENTIAL/PLATELET
Abs Immature Granulocytes: 0.03 10*3/uL (ref 0.00–0.07)
Basophils Absolute: 0.1 10*3/uL (ref 0.0–0.1)
Basophils Relative: 1 %
Eosinophils Absolute: 0 10*3/uL (ref 0.0–0.5)
Eosinophils Relative: 1 %
HCT: 39.5 % (ref 36.0–46.0)
Hemoglobin: 12.5 g/dL (ref 12.0–15.0)
Immature Granulocytes: 0 %
Lymphocytes Relative: 16 %
Lymphs Abs: 1.2 10*3/uL (ref 0.7–4.0)
MCH: 29.7 pg (ref 26.0–34.0)
MCHC: 31.6 g/dL (ref 30.0–36.0)
MCV: 93.8 fL (ref 80.0–100.0)
Monocytes Absolute: 0.6 10*3/uL (ref 0.1–1.0)
Monocytes Relative: 8 %
Neutro Abs: 5.4 10*3/uL (ref 1.7–7.7)
Neutrophils Relative %: 74 %
Platelets: 304 10*3/uL (ref 150–400)
RBC: 4.21 MIL/uL (ref 3.87–5.11)
RDW: 19.9 % — ABNORMAL HIGH (ref 11.5–15.5)
WBC: 7.3 10*3/uL (ref 4.0–10.5)
nRBC: 0 % (ref 0.0–0.2)

## 2020-11-30 LAB — COMPREHENSIVE METABOLIC PANEL
ALT: 62 U/L — ABNORMAL HIGH (ref 0–44)
AST: 131 U/L — ABNORMAL HIGH (ref 15–41)
Albumin: 4.2 g/dL (ref 3.5–5.0)
Alkaline Phosphatase: 71 U/L (ref 38–126)
Anion gap: 17 — ABNORMAL HIGH (ref 5–15)
BUN: 17 mg/dL (ref 6–20)
CO2: 20 mmol/L — ABNORMAL LOW (ref 22–32)
Calcium: 10.3 mg/dL (ref 8.9–10.3)
Chloride: 100 mmol/L (ref 98–111)
Creatinine, Ser: 0.88 mg/dL (ref 0.44–1.00)
GFR, Estimated: >60 mL/min (ref >60)
Glucose, Bld: 140 mg/dL — ABNORMAL HIGH (ref 70–99)
Potassium: 3 mmol/L — ABNORMAL LOW (ref 3.5–5.1)
Sodium: 137 mmol/L (ref 135–145)
Total Bilirubin: 1.4 mg/dL — ABNORMAL HIGH (ref 0.3–1.2)
Total Protein: 7.5 g/dL (ref 6.5–8.1)

## 2020-11-30 LAB — I-STAT BETA HCG BLOOD, ED (MC, WL, AP ONLY): I-stat hCG, quantitative: <5 m[IU]/mL (ref <5)

## 2020-11-30 MED ORDER — TRIAMCINOLONE ACETONIDE 0.1 % EX CREA: TOPICAL_CREAM | CUTANEOUS | 0 refills | Status: DC

## 2020-11-30 MED ORDER — MECLIZINE HCL 25 MG PO TABS: 25.0000 mg | ORAL_TABLET | Freq: Three times a day (TID) | ORAL | 0 refills | Status: DC | PRN

## 2020-11-30 MED ORDER — POTASSIUM CHLORIDE CRYS ER 20 MEQ PO TBCR
40.0000 meq | EXTENDED_RELEASE_TABLET | Freq: Once | ORAL | Status: AC
  Administered 2020-11-30: 40 meq via ORAL
  Filled 2020-11-30: qty 2

## 2020-11-30 NOTE — Discharge Instructions (Addendum)
Avoid alcohol and tylenol. Follow up with a primary care doctor to have your liver enzymes retested and to follow up on your high blood pressure, eczema, and low potassium. Follow up with and ENT doctor for your dizziness. Get help right away if you: Are always dizzy or you faint. Develop severe headaches. Develop a stiff neck. Develop sensitivity to light. Have difficulty moving or speaking. Have weakness in your hands, arms, or legs. Have changes in your hearing.

## 2020-11-30 NOTE — ED Triage Notes (Signed)
Patient arrived with EMS from home reports SOB with dizziness this evening , patient took Benadryl before going to bed .

## 2020-11-30 NOTE — ED Provider Notes (Signed)
[facility] EMERGENCY DEPARTMENT Provider Note   CSN: [id number] Arrival date & time: 11/30/20  0040     History Chief Complaint  Patient presents with   SOB / Dizzy    Beth Barnes is a 25 y.o. female  Presents with dizziness and sob x 1 day. Awoke with heaviness, dizziness, and nausea.  1 episode of vomiting. Dizziness is room spinning, happens at night and during day. Sometimes brief, sometimes long periods of time.  5 previous episodes in the last week, but this is the worst one.Took benadryl. Her sxs have resolved. She had some racing heart. She had pizza before bed.  She also c/o rash that is burning on her chest and forearms.  HPI     Past Medical History:  Diagnosis Date   Blind    Congenital glaucoma of both eyes    Glaucoma    Hypokalemia     There are no problems to display for this patient.   Past Surgical History:  Procedure Laterality Date   EYE SURGERY       OB History   No obstetric history on file.     Family History  Problem Relation Age of Onset   Healthy Mother    Healthy Father     Social History   Tobacco Use   Smoking status: Never   Smokeless tobacco: Never  Vaping Use   Vaping Use: Never used  Substance Use Topics   Alcohol use: Yes    Alcohol/week: 3.0 standard drinks    Types: 3 Shots of liquor per week    Comment: Every other day   Drug use: Not Currently    Home Medications Prior to Admission medications   Medication Sig Start Date End Date Taking? Authorizing Provider  meclizine (ANTIVERT) 25 MG tablet Take 1 tablet (25 mg total) by mouth 3 (three) times daily as needed for dizziness or nausea. 11/30/20  Yes Gerado Nabers, PA-C  triamcinolone cream (KENALOG) 0.1 % Apply 2 x daily to the affected area. 11/30/20  Yes Havier Deeb, PA-C  clonazePAM (KLONOPIN) 0.5 MG tablet Take 0.5 mg by mouth daily as needed. 11/22/19   [provider]  famotidine (PEPCID) 20 MG tablet Take 1  tablet (20 mg total) by mouth 2 (two) times daily. Patient not taking: No sig reported 02/01/20   Pricilla Loveless, MD  ibuprofen (ADVIL) 200 MG tablet Take 200-400 mg by mouth every 6 (six) hours as needed for cramping or mild pain.    [provider]  ondansetron (ZOFRAN ODT) 4 MG disintegrating tablet Take 1 tablet (4 mg total) by mouth every 8 (eight) hours as needed for nausea or vomiting. Patient not taking: No sig reported 02/01/20   Pricilla Loveless, MD  pantoprazole (PROTONIX) 40 MG tablet Take 1 tablet (40 mg total) by mouth daily. Patient not taking: No sig reported 02/01/20   Pricilla Loveless, MD  potassium chloride SA (KLOR-CON) 20 MEQ tablet Take 1 tablet (20 mEq total) by mouth 2 (two) times daily. Patient not taking: No sig reported 12/23/19   Mancel Bale, MD  propranolol (INDERAL) 40 MG tablet Take by mouth at bedtime. 11/16/19   [provider]    Allergies    Cetirizine & related  Review of Systems   Review of Systems Ten systems reviewed and are negative for acute change, except as noted in the HPI.   Physical Exam Updated Vital Signs BP (!) 143/97 (BP Location: Right Arm)   Pulse 73  Temp 97.6 F (36.4 C) (Oral)   Resp 15   Ht 5\' 2"  (1.575 m)   Wt 74 kg   LMP 10/20/2020 Comment: could possibly she doesnt know  SpO2 100%   BMI 29.84 kg/m   Physical Exam Vitals and nursing note reviewed.  Constitutional:      General: She is not in acute distress.    Appearance: She is well-developed. She is not diaphoretic.  HENT:     Head: Normocephalic and atraumatic.     Right Ear: Tympanic membrane and external ear normal.     Left Ear: Tympanic membrane and external ear normal.     Nose: Nose normal.     Mouth/Throat:     Mouth: Mucous membranes are moist.  Eyes:     General: No scleral icterus.    Comments: Bilateral eye opacifications, disconjugate eye movements  Cardiovascular:     Rate and Rhythm: Normal rate and regular rhythm.      Heart sounds: Normal heart sounds. No murmur heard.   No friction rub. No gallop.  Pulmonary:     Effort: Pulmonary effort is normal. No respiratory distress.     Breath sounds: Normal breath sounds.  Abdominal:     General: Bowel sounds are normal. There is no distension.     Palpations: Abdomen is soft. There is no mass.     Tenderness: There is no abdominal tenderness. There is no guarding.  Musculoskeletal:     Cervical back: Normal range of motion.  Skin:    General: Skin is warm and dry.  Neurological:     General: No focal deficit present.     Mental Status: She is alert and oriented to person, place, and time.     Cranial Nerves: No cranial nerve deficit.     Sensory: No sensory deficit.     Motor: No weakness.     Coordination: Coordination normal.     Gait: Gait normal.     Deep Tendon Reflexes: Reflexes normal.  Psychiatric:        Behavior: Behavior normal.    ED Results / Procedures / Treatments   Labs (all labs ordered are listed, but only abnormal results are displayed) Labs Reviewed  CBC WITH DIFFERENTIAL/PLATELET - Abnormal; Notable for the following components:      Result Value   RDW 19.9 (*)    All other components within normal limits  COMPREHENSIVE METABOLIC PANEL - Abnormal; Notable for the following components:   Potassium 3.0 (*)    CO2 20 (*)    Glucose, Bld 140 (*)    AST 131 (*)    ALT 62 (*)    Total Bilirubin 1.4 (*)    Anion gap 17 (*)    All other components within normal limits  I-STAT BETA HCG BLOOD, ED ([facility], [facility], [facility] ONLY)    EKG None  Radiology DG Chest 2 View  Result Date: 11/30/2020 CLINICAL DATA:  Shortness of breath. EXAM: CHEST - 2 VIEW COMPARISON:  None. FINDINGS: The cardiomediastinal contours are normal. The lungs are clear. Pulmonary vasculature is normal. No consolidation, pleural effusion, or pneumothorax. No acute osseous abnormalities are seen. IMPRESSION: Negative radiographs of the chest. Electronically Signed   By:  12/02/2020 M.D.   On: 11/30/2020 01:37    Procedures Procedures   Medications Ordered in ED Medications  potassium chloride SA (KLOR-CON) CR tablet 40 mEq (has no administration in time range)    ED Course  I have reviewed  the triage vital signs and the nursing notes.  Pertinent labs & imaging results that were available during my care of the patient were reviewed by me and considered in my medical decision making (see chart for details).    MDM Rules/Calculators/A&P                           25 year old female presents the emergency department with chief complaint of room spinning dizziness and vomiting.  She has had several intermittent episodes of the same. The emergent differential diagnosis for acute vertigo low includes peripheral causes such as BPPV, barotrauma, ear foreign body, Mnire's disease, infectious causes such as lip bronchitis, vestibular neuritis or Ramsay Hunt syndrome.  Other emergent causes are central such as cerebellar stroke, vertebrobasilar insufficiency, neoplastic causes, vertebral artery dissection, MS, neurosyphilis or tuberculosis, epilepsy or migraine.  Other causes include anemia, hyperviscosity syndrome, alcohol or aminoglycoside use, renal failure, hypoglycemia and thyroid disease. I reviewed the patient's labs ordered in triage that showed negative pregnancy test, CBC without abnormality, CMP shows hypokalemia which the patient states she is chronic and for which she takes supplementation along with mildly elevated blood glucose at 140, elevated AST and ALT along with a mildly elevated anion gap acidosis which is likely due to dehydration from vomiting last night.  She states that she also does not drink much fluid.  I doubt any other emergent cause and patient denies ingestion of salicylates, Tylenol, alcohol. Patient most likely has peripheral vertigo.  She has no abnormal neuro findings however her exam is somewhat limited due to her blindness and  I am unable to perform finger-to-nose or track nystagmus.  Patient is not ataxic.  She will be discharged with meclizine, outpatient follow-up with PCP for hypertension, mildly elevated liver enzymes, hypokalemia.  Follow-up with ENT for vertigo.  Return precautions given.  She appears otherwise appropriate for discharge at this time. Final Clinical Impression(s) / ED Diagnoses Final diagnoses:  Peripheral vertigo, unspecified laterality  Elevated blood pressure reading  Chronic hypokalemia  Flexural eczema    Rx / DC Orders ED Discharge Orders          Ordered    triamcinolone cream (KENALOG) 0.1 %        11/30/20 0726    meclizine (ANTIVERT) 25 MG tablet  3 times daily PRN        11/30/20 0726             Arthor Captain, PA-C 11/30/20 0740    Margarita Grizzle, MD 12/01/20 [phone]

## 2020-12-26 ENCOUNTER — Emergency Department (HOSPITAL_COMMUNITY)
Admission: EM | Admit: 2020-12-26 | Discharge: 2020-12-27 | Disposition: A | Discharge status: Home / Self Care | Payer: Medicaid - Out of State | Attending: Emergency Medicine | Admitting: Emergency Medicine

## 2020-12-26 ENCOUNTER — Other Ambulatory Visit: Payer: Self-pay

## 2020-12-26 ENCOUNTER — Encounter (HOSPITAL_COMMUNITY): Payer: Self-pay | Admitting: Emergency Medicine

## 2020-12-26 DIAGNOSIS — R3912 Poor urinary stream: Secondary | ICD-10-CM | POA: Insufficient documentation

## 2020-12-26 DIAGNOSIS — Z5321 Procedure and treatment not carried out due to patient leaving prior to being seen by health care provider: Secondary | ICD-10-CM | POA: Insufficient documentation

## 2020-12-26 DIAGNOSIS — M549 Dorsalgia, unspecified: Secondary | ICD-10-CM | POA: Insufficient documentation

## 2020-12-26 DIAGNOSIS — R1030 Lower abdominal pain, unspecified: Secondary | ICD-10-CM | POA: Diagnosis present

## 2020-12-26 DIAGNOSIS — R112 Nausea with vomiting, unspecified: Secondary | ICD-10-CM | POA: Diagnosis not present

## 2020-12-26 LAB — CBC WITH DIFFERENTIAL/PLATELET
Abs Immature Granulocytes: 0.03 10*3/uL (ref 0.00–0.07)
Basophils Absolute: 0.1 10*3/uL (ref 0.0–0.1)
Basophils Relative: 1 %
Eosinophils Absolute: 0 10*3/uL (ref 0.0–0.5)
Eosinophils Relative: 0 %
HCT: 40.3 % (ref 36.0–46.0)
Hemoglobin: 13 g/dL (ref 12.0–15.0)
Immature Granulocytes: 0 %
Lymphocytes Relative: 10 %
Lymphs Abs: 0.9 10*3/uL (ref 0.7–4.0)
MCH: 29 pg (ref 26.0–34.0)
MCHC: 32.3 g/dL (ref 30.0–36.0)
MCV: 90 fL (ref 80.0–100.0)
Monocytes Absolute: 0.7 10*3/uL (ref 0.1–1.0)
Monocytes Relative: 7 %
Neutro Abs: 7.8 10*3/uL — ABNORMAL HIGH (ref 1.7–7.7)
Neutrophils Relative %: 82 %
Platelets: 282 10*3/uL (ref 150–400)
RBC: 4.48 MIL/uL (ref 3.87–5.11)
RDW: 18.7 % — ABNORMAL HIGH (ref 11.5–15.5)
WBC: 9.5 10*3/uL (ref 4.0–10.5)
nRBC: 0 % (ref 0.0–0.2)

## 2020-12-26 LAB — COMPREHENSIVE METABOLIC PANEL
ALT: 129 U/L — ABNORMAL HIGH (ref 0–44)
AST: 264 U/L — ABNORMAL HIGH (ref 15–41)
Albumin: 4.5 g/dL (ref 3.5–5.0)
Alkaline Phosphatase: 81 U/L (ref 38–126)
Anion gap: 17 — ABNORMAL HIGH (ref 5–15)
BUN: 8 mg/dL (ref 6–20)
CO2: 23 mmol/L (ref 22–32)
Calcium: 9.8 mg/dL (ref 8.9–10.3)
Chloride: 96 mmol/L — ABNORMAL LOW (ref 98–111)
Creatinine, Ser: 0.78 mg/dL (ref 0.44–1.00)
GFR, Estimated: >60 mL/min (ref >60)
Glucose, Bld: 81 mg/dL (ref 70–99)
Potassium: 3.7 mmol/L (ref 3.5–5.1)
Sodium: 136 mmol/L (ref 135–145)
Total Bilirubin: 2.9 mg/dL — ABNORMAL HIGH (ref 0.3–1.2)
Total Protein: 8.4 g/dL — ABNORMAL HIGH (ref 6.5–8.1)

## 2020-12-26 LAB — I-STAT BETA HCG BLOOD, ED (MC, WL, AP ONLY): I-stat hCG, quantitative: <5 m[IU]/mL (ref <5)

## 2020-12-26 LAB — LIPASE, BLOOD: Lipase: 29 U/L (ref 11–51)

## 2020-12-26 NOTE — ED Provider Notes (Signed)
Emergency Medicine Provider Triage Evaluation Note  Beth Barnes , a 25 y.o. female  was evaluated in triage.  Pt complains of lower abd and back pain. Reports nv and decreased urinary output.  Review of Systems  Positive: Abd pain, back pain, nv, decreased urinary output Negative: fever  Physical Exam  BP (!) 154/126 (BP Location: Right Arm)   Pulse 86   Temp 98.1 F (36.7 C)   Resp 18   Ht 5\' 2"  (1.575 m)   Wt 67.1 kg   LMP 12/09/2020 (Within Days)   SpO2 98%   BMI 27.07 kg/m  Gen:   Awake, no distress   Resp:  Normal effort  MSK:   Moves extremities without difficulty  Other:  Mild lower abd ttp  Medical Decision Making  Medically screening exam initiated at 6:27 PM.  Appropriate orders placed.  Beth Barnes was informed that the remainder of the evaluation will be completed by another provider, this initial triage assessment does not replace that evaluation, and the importance of remaining in the ED until their evaluation is complete.     12/11/2020, PA-C 12/26/20 1829    12/28/20, MD 12/29/20 1106

## 2020-12-26 NOTE — ED Triage Notes (Signed)
Pt presents to ED POV. Pt c/o emesis x2d. Pt also c/o back and generalized abd pain. Neg flu test at [facility].

## 2020-12-27 NOTE — ED Notes (Signed)
Pt left due to wait time  

## 2021-05-13 ENCOUNTER — Inpatient Hospital Stay (HOSPITAL_COMMUNITY)
Admission: AD | Admit: 2021-05-13 | Discharge: 2021-05-20 | DRG: 308 | Disposition: A | Discharge status: Rehabilitation Facility | Payer: Medicaid Other | Attending: Internal Medicine | Admitting: Internal Medicine

## 2021-05-13 ENCOUNTER — Inpatient Hospital Stay (HOSPITAL_COMMUNITY): Payer: Medicaid Other

## 2021-05-13 ENCOUNTER — Inpatient Hospital Stay (HOSPITAL_COMMUNITY): Payer: Medicaid Other | Admitting: Certified Registered Nurse Anesthetist

## 2021-05-13 ENCOUNTER — Encounter (HOSPITAL_COMMUNITY): Payer: Self-pay | Admitting: Internal Medicine

## 2021-05-13 DIAGNOSIS — E876 Hypokalemia: Secondary | ICD-10-CM | POA: Diagnosis present

## 2021-05-13 DIAGNOSIS — H547 Unspecified visual loss: Secondary | ICD-10-CM | POA: Diagnosis present

## 2021-05-13 DIAGNOSIS — R413 Other amnesia: Secondary | ICD-10-CM | POA: Diagnosis not present

## 2021-05-13 DIAGNOSIS — R5381 Other malaise: Secondary | ICD-10-CM | POA: Diagnosis not present

## 2021-05-13 DIAGNOSIS — R509 Fever, unspecified: Secondary | ICD-10-CM | POA: Diagnosis present

## 2021-05-13 DIAGNOSIS — I5021 Acute systolic (congestive) heart failure: Secondary | ICD-10-CM | POA: Diagnosis not present

## 2021-05-13 DIAGNOSIS — F10239 Alcohol dependence with withdrawal, unspecified: Secondary | ICD-10-CM | POA: Diagnosis not present

## 2021-05-13 DIAGNOSIS — E519 Thiamine deficiency, unspecified: Secondary | ICD-10-CM | POA: Diagnosis present

## 2021-05-13 DIAGNOSIS — I4901 Ventricular fibrillation: Secondary | ICD-10-CM | POA: Diagnosis present

## 2021-05-13 DIAGNOSIS — I469 Cardiac arrest, cause unspecified: Secondary | ICD-10-CM

## 2021-05-13 DIAGNOSIS — T426X5A Adverse effect of other antiepileptic and sedative-hypnotic drugs, initial encounter: Secondary | ICD-10-CM | POA: Diagnosis not present

## 2021-05-13 DIAGNOSIS — G931 Anoxic brain damage, not elsewhere classified: Secondary | ICD-10-CM | POA: Diagnosis present

## 2021-05-13 DIAGNOSIS — G4089 Other seizures: Secondary | ICD-10-CM | POA: Diagnosis present

## 2021-05-13 DIAGNOSIS — I420 Dilated cardiomyopathy: Secondary | ICD-10-CM | POA: Diagnosis present

## 2021-05-13 DIAGNOSIS — R569 Unspecified convulsions: Principal | ICD-10-CM

## 2021-05-13 DIAGNOSIS — Z79899 Other long term (current) drug therapy: Secondary | ICD-10-CM

## 2021-05-13 DIAGNOSIS — J9601 Acute respiratory failure with hypoxia: Secondary | ICD-10-CM | POA: Diagnosis present

## 2021-05-13 DIAGNOSIS — Z970 Presence of artificial eye: Secondary | ICD-10-CM

## 2021-05-13 DIAGNOSIS — R451 Restlessness and agitation: Secondary | ICD-10-CM | POA: Diagnosis not present

## 2021-05-13 DIAGNOSIS — I462 Cardiac arrest due to underlying cardiac condition: Secondary | ICD-10-CM | POA: Diagnosis present

## 2021-05-13 DIAGNOSIS — R4587 Impulsiveness: Secondary | ICD-10-CM | POA: Diagnosis not present

## 2021-05-13 DIAGNOSIS — G9340 Encephalopathy, unspecified: Secondary | ICD-10-CM | POA: Diagnosis not present

## 2021-05-13 DIAGNOSIS — I426 Alcoholic cardiomyopathy: Secondary | ICD-10-CM | POA: Diagnosis present

## 2021-05-13 DIAGNOSIS — Z888 Allergy status to other drugs, medicaments and biological substances status: Secondary | ICD-10-CM | POA: Diagnosis not present

## 2021-05-13 DIAGNOSIS — G253 Myoclonus: Secondary | ICD-10-CM | POA: Diagnosis not present

## 2021-05-13 DIAGNOSIS — R570 Cardiogenic shock: Secondary | ICD-10-CM | POA: Diagnosis present

## 2021-05-13 DIAGNOSIS — R778 Other specified abnormalities of plasma proteins: Secondary | ICD-10-CM | POA: Diagnosis present

## 2021-05-13 DIAGNOSIS — I110 Hypertensive heart disease with heart failure: Secondary | ICD-10-CM | POA: Diagnosis present

## 2021-05-13 DIAGNOSIS — R519 Headache, unspecified: Secondary | ICD-10-CM | POA: Diagnosis not present

## 2021-05-13 DIAGNOSIS — Q150 Congenital glaucoma: Secondary | ICD-10-CM | POA: Diagnosis not present

## 2021-05-13 DIAGNOSIS — H5410 Blindness, one eye, low vision other eye, unspecified eyes: Secondary | ICD-10-CM

## 2021-05-13 DIAGNOSIS — F1010 Alcohol abuse, uncomplicated: Secondary | ICD-10-CM | POA: Diagnosis not present

## 2021-05-13 DIAGNOSIS — I509 Heart failure, unspecified: Secondary | ICD-10-CM | POA: Diagnosis not present

## 2021-05-13 DIAGNOSIS — Z781 Physical restraint status: Secondary | ICD-10-CM

## 2021-05-13 LAB — URINALYSIS, ROUTINE W REFLEX MICROSCOPIC
Bacteria, UA: NONE SEEN
Bilirubin Urine: NEGATIVE
Glucose, UA: 150 mg/dL — AB
Ketones, ur: 80 mg/dL — AB
Leukocytes,Ua: NEGATIVE
Nitrite: NEGATIVE
Protein, ur: 100 mg/dL — AB
Specific Gravity, Urine: 1.016 (ref 1.005–1.030)
pH: 6 (ref 5.0–8.0)

## 2021-05-13 LAB — BLOOD GAS, ARTERIAL
Acid-base deficit: 3.3 mmol/L — ABNORMAL HIGH (ref 0.0–2.0)
Bicarbonate: 21.3 mmol/L (ref 20.0–28.0)
Drawn by: 560031
FIO2: 100 %
MECHVT: 470 mL
O2 Saturation: 98.6 %
PEEP: 5 cmH2O
Patient temperature: 37
RATE: 18 resp/min
pCO2 arterial: 36 mmHg (ref 32–48)
pH, Arterial: 7.38 (ref 7.35–7.45)
pO2, Arterial: 93 mmHg (ref 83–108)

## 2021-05-13 LAB — RAPID URINE DRUG SCREEN, HOSP PERFORMED
Amphetamines: NOT DETECTED
Barbiturates: NOT DETECTED
Benzodiazepines: POSITIVE — AB
Cocaine: NOT DETECTED
Opiates: NOT DETECTED
Tetrahydrocannabinol: NOT DETECTED

## 2021-05-13 LAB — CBC
HCT: 42.3 % (ref 36.0–46.0)
Hemoglobin: 13.1 g/dL (ref 12.0–15.0)
MCH: 29.8 pg (ref 26.0–34.0)
MCHC: 31 g/dL (ref 30.0–36.0)
MCV: 96.1 fL (ref 80.0–100.0)
Platelets: 210 10*3/uL (ref 150–400)
RBC: 4.4 MIL/uL (ref 3.87–5.11)
RDW: 20.7 % — ABNORMAL HIGH (ref 11.5–15.5)
WBC: 15.8 10*3/uL — ABNORMAL HIGH (ref 4.0–10.5)
nRBC: 0.3 % — ABNORMAL HIGH (ref 0.0–0.2)

## 2021-05-13 LAB — TROPONIN I (HIGH SENSITIVITY)
Troponin I (High Sensitivity): 280 ng/L (ref <18)
Troponin I (High Sensitivity): 1223 ng/L (ref <18)

## 2021-05-13 LAB — GLUCOSE, CAPILLARY
Glucose-Capillary: 254 mg/dL — ABNORMAL HIGH (ref 70–99)
Glucose-Capillary: 108 mg/dL — ABNORMAL HIGH (ref 70–99)

## 2021-05-13 LAB — BASIC METABOLIC PANEL
Anion gap: 22 — ABNORMAL HIGH (ref 5–15)
BUN: 11 mg/dL (ref 6–20)
CO2: 18 mmol/L — ABNORMAL LOW (ref 22–32)
Calcium: 8.1 mg/dL — ABNORMAL LOW (ref 8.9–10.3)
Chloride: 97 mmol/L — ABNORMAL LOW (ref 98–111)
Creatinine, Ser: 0.86 mg/dL (ref 0.44–1.00)
GFR, Estimated: >60 mL/min (ref >60)
Glucose, Bld: 245 mg/dL — ABNORMAL HIGH (ref 70–99)
Potassium: 3.2 mmol/L — ABNORMAL LOW (ref 3.5–5.1)
Sodium: 137 mmol/L (ref 135–145)

## 2021-05-13 LAB — TYPE AND SCREEN
ABO/RH(D): O POS
Antibody Screen: NEGATIVE

## 2021-05-13 LAB — LACTIC ACID, PLASMA
Lactic Acid, Venous: >9 mmol/L (ref 0.5–1.9)
Lactic Acid, Venous: 8.4 mmol/L (ref 0.5–1.9)

## 2021-05-13 LAB — HIV ANTIBODY (ROUTINE TESTING W REFLEX): HIV Screen 4th Generation wRfx: NONREACTIVE

## 2021-05-13 LAB — MAGNESIUM: Magnesium: 1.6 mg/dL — ABNORMAL LOW (ref 1.7–2.4)

## 2021-05-13 LAB — ABO/RH: ABO/RH(D): O POS

## 2021-05-13 LAB — MRSA NEXT GEN BY PCR, NASAL: MRSA by PCR Next Gen: NOT DETECTED

## 2021-05-13 LAB — PHOSPHORUS: Phosphorus: 5.1 mg/dL — ABNORMAL HIGH (ref 2.5–4.6)

## 2021-05-13 MED ORDER — ROCURONIUM BROMIDE 10 MG/ML (PF) SYRINGE
PREFILLED_SYRINGE | INTRAVENOUS | Status: AC
  Administered 2021-05-13: 50 mg
  Filled 2021-05-13: qty 10

## 2021-05-13 MED ORDER — ACETAMINOPHEN 160 MG/5ML PO SOLN: 650.0000 mg | ORAL | Status: DC | PRN

## 2021-05-13 MED ORDER — NOREPINEPHRINE 4 MG/250ML-% IV SOLN
0.0000 ug/min | INTRAVENOUS | Status: DC
  Administered 2021-05-13: 16 ug/min via INTRAVENOUS
  Administered 2021-05-13: 4 ug/min via INTRAVENOUS
  Administered 2021-05-14: 15 ug/min via INTRAVENOUS
  Administered 2021-05-14: 6 ug/min via INTRAVENOUS
  Administered 2021-05-14: 15 ug/min via INTRAVENOUS
  Administered 2021-05-14: 13 ug/min via INTRAVENOUS
  Administered 2021-05-14: 20 ug/min via INTRAVENOUS
  Filled 2021-05-13 (×7): qty 250

## 2021-05-13 MED ORDER — BUSPIRONE HCL 10 MG PO TABS: 30.0000 mg | ORAL_TABLET | Freq: Three times a day (TID) | ORAL | Status: AC | PRN

## 2021-05-13 MED ORDER — PANTOPRAZOLE SODIUM 40 MG IV SOLR
40.0000 mg | Freq: Every day | INTRAVENOUS | Status: DC
  Administered 2021-05-13 – 2021-05-14 (×2): 40 mg via INTRAVENOUS
  Filled 2021-05-13 (×2): qty 10

## 2021-05-13 MED ORDER — ONDANSETRON HCL 4 MG/2ML IJ SOLN: 4.0000 mg | Freq: Four times a day (QID) | INTRAMUSCULAR | Status: DC | PRN

## 2021-05-13 MED ORDER — AMIODARONE HCL IN DEXTROSE 360-4.14 MG/200ML-% IV SOLN
30.0000 mg/h | INTRAVENOUS | Status: DC
  Administered 2021-05-14: 30 mg/h via INTRAVENOUS
  Filled 2021-05-13 (×2): qty 200

## 2021-05-13 MED ORDER — FOLIC ACID 1 MG PO TABS
1.0000 mg | ORAL_TABLET | Freq: Every day | ORAL | Status: DC
  Administered 2021-05-13 – 2021-05-20 (×8): 1 mg
  Filled 2021-05-13 (×8): qty 1

## 2021-05-13 MED ORDER — PROPOFOL 1000 MG/100ML IV EMUL
0.0000 ug/kg/min | INTRAVENOUS | Status: DC
  Administered 2021-05-13: 70 ug/kg/min via INTRAVENOUS
  Administered 2021-05-14: 35 ug/kg/min via INTRAVENOUS
  Administered 2021-05-14 (×4): 60 ug/kg/min via INTRAVENOUS
  Administered 2021-05-15 (×2): 40 ug/kg/min via INTRAVENOUS
  Filled 2021-05-13 (×8): qty 100

## 2021-05-13 MED ORDER — POLYETHYLENE GLYCOL 3350 17 G PO PACK
17.0000 g | PACK | Freq: Every day | ORAL | Status: DC
  Administered 2021-05-13 – 2021-05-15 (×3): 17 g
  Filled 2021-05-13 (×3): qty 1

## 2021-05-13 MED ORDER — CHLORHEXIDINE GLUCONATE 0.12% ORAL RINSE (MEDLINE KIT)
15.0000 mL | Freq: Two times a day (BID) | OROMUCOSAL | Status: DC
  Administered 2021-05-13 – 2021-05-18 (×9): 15 mL via OROMUCOSAL

## 2021-05-13 MED ORDER — ETOMIDATE 2 MG/ML IV SOLN
INTRAVENOUS | Status: AC
  Administered 2021-05-13: 20 mg
  Filled 2021-05-13: qty 10

## 2021-05-13 MED ORDER — PROPOFOL 1000 MG/100ML IV EMUL
0.0000 ug/kg/min | INTRAVENOUS | Status: DC
  Administered 2021-05-13: 15 ug/kg/min via INTRAVENOUS

## 2021-05-13 MED ORDER — INSULIN ASPART 100 UNIT/ML IJ SOLN
0.0000 [IU] | INTRAMUSCULAR | Status: DC
  Administered 2021-05-13: 5 [IU] via SUBCUTANEOUS
  Administered 2021-05-14 (×2): 1 [IU] via SUBCUTANEOUS
  Administered 2021-05-14: 3 [IU] via SUBCUTANEOUS
  Administered 2021-05-14: 2 [IU] via SUBCUTANEOUS

## 2021-05-13 MED ORDER — LORAZEPAM 2 MG/ML IJ SOLN
INTRAMUSCULAR | Status: AC
  Administered 2021-05-13: 4 mg via INTRAVENOUS
  Filled 2021-05-13: qty 1

## 2021-05-13 MED ORDER — DOCUSATE SODIUM 50 MG/5ML PO LIQD
100.0000 mg | Freq: Two times a day (BID) | ORAL | Status: DC | PRN
  Filled 2021-05-13: qty 10

## 2021-05-13 MED ORDER — LORAZEPAM 2 MG/ML IJ SOLN
2.0000 mg | INTRAMUSCULAR | Status: DC | PRN
  Administered 2021-05-15 – 2021-05-16 (×2): 2 mg via INTRAVENOUS
  Filled 2021-05-13 (×3): qty 1

## 2021-05-13 MED ORDER — SODIUM CHLORIDE 0.9 % IV SOLN
250.0000 mL | INTRAVENOUS | Status: DC
  Administered 2021-05-13: 250 mL via INTRAVENOUS

## 2021-05-13 MED ORDER — THIAMINE HCL 100 MG PO TABS
100.0000 mg | ORAL_TABLET | Freq: Every day | ORAL | Status: DC
  Administered 2021-05-13 – 2021-05-14 (×2): 100 mg
  Filled 2021-05-13 (×2): qty 1

## 2021-05-13 MED ORDER — DOCUSATE SODIUM 50 MG/5ML PO LIQD
100.0000 mg | Freq: Two times a day (BID) | ORAL | Status: DC
  Administered 2021-05-14 – 2021-05-15 (×3): 100 mg
  Filled 2021-05-13 (×3): qty 10

## 2021-05-13 MED ORDER — SODIUM CHLORIDE 0.9 % IV SOLN
2.0000 g | INTRAVENOUS | Status: DC
  Administered 2021-05-13 – 2021-05-14 (×2): 2 g via INTRAVENOUS
  Filled 2021-05-13 (×3): qty 20

## 2021-05-13 MED ORDER — ORAL CARE MOUTH RINSE
15.0000 mL | OROMUCOSAL | Status: DC
  Administered 2021-05-13 – 2021-05-15 (×17): 15 mL via OROMUCOSAL

## 2021-05-13 MED ORDER — POLYETHYLENE GLYCOL 3350 17 G PO PACK: 17.0000 g | PACK | Freq: Every day | ORAL | Status: DC | PRN

## 2021-05-13 MED ORDER — PROPOFOL 1000 MG/100ML IV EMUL
0.0000 ug/kg/min | INTRAVENOUS | Status: DC
  Administered 2021-05-13: 50 ug/kg/min via INTRAVENOUS

## 2021-05-13 MED ORDER — MAGNESIUM SULFATE 2 GM/50ML IV SOLN
2.0000 g | Freq: Once | INTRAVENOUS | Status: AC | PRN
  Administered 2021-05-13: 2 g via INTRAVENOUS

## 2021-05-13 MED ORDER — CHLORHEXIDINE GLUCONATE CLOTH 2 % EX PADS
6.0000 | MEDICATED_PAD | Freq: Every day | CUTANEOUS | Status: DC
  Administered 2021-05-13 – 2021-05-18 (×5): 6 via TOPICAL

## 2021-05-13 MED ORDER — FENTANYL 2500MCG IN NS 250ML (10MCG/ML) PREMIX INFUSION
50.0000 ug/h | INTRAVENOUS | Status: DC
  Administered 2021-05-13: 50 ug/h via INTRAVENOUS
  Administered 2021-05-14: 75 ug/h via INTRAVENOUS
  Filled 2021-05-13 (×2): qty 250

## 2021-05-13 MED ORDER — MIDAZOLAM-SODIUM CHLORIDE 100-0.9 MG/100ML-% IV SOLN
0.0000 mg/h | INTRAVENOUS | Status: DC
  Administered 2021-05-13: 0.5 mg/h via INTRAVENOUS
  Filled 2021-05-13: qty 100

## 2021-05-13 MED ORDER — ACETAMINOPHEN 160 MG/5ML PO SOLN
650.0000 mg | ORAL | Status: AC
  Administered 2021-05-13 – 2021-05-15 (×9): 650 mg
  Filled 2021-05-13 (×5): qty 20.3

## 2021-05-13 MED ORDER — ACETAMINOPHEN 160 MG/5ML PO SOLN
650.0000 mg | ORAL | Status: AC
  Administered 2021-05-15: 650 mg via ORAL
  Filled 2021-05-13 (×4): qty 20.3

## 2021-05-13 MED ORDER — FENTANYL BOLUS VIA INFUSION
50.0000 ug | INTRAVENOUS | Status: DC | PRN
  Administered 2021-05-13: 100 ug via INTRAVENOUS
  Administered 2021-05-13 – 2021-05-15 (×2): 50 ug via INTRAVENOUS
  Filled 2021-05-13: qty 100

## 2021-05-13 MED ORDER — LEVETIRACETAM IN NACL 1500 MG/100ML IV SOLN
1500.0000 mg | Freq: Once | INTRAVENOUS | Status: AC
  Administered 2021-05-13: 1500 mg via INTRAVENOUS
  Filled 2021-05-13: qty 100

## 2021-05-13 MED ORDER — ACETAMINOPHEN 650 MG RE SUPP: 650.0000 mg | RECTAL | Status: AC

## 2021-05-13 MED ORDER — LEVETIRACETAM IN NACL 1500 MG/100ML IV SOLN
1500.0000 mg | Freq: Two times a day (BID) | INTRAVENOUS | Status: DC
Start: 2021-05-14 — End: 2021-05-17
  Administered 2021-05-14 – 2021-05-16 (×5): 1500 mg via INTRAVENOUS
  Filled 2021-05-13 (×7): qty 100

## 2021-05-13 MED ORDER — AMIODARONE HCL IN DEXTROSE 360-4.14 MG/200ML-% IV SOLN
INTRAVENOUS | Status: AC
  Administered 2021-05-13: 30 mg/h via INTRAVENOUS
  Filled 2021-05-13: qty 200

## 2021-05-13 MED ORDER — ACETAMINOPHEN 650 MG RE SUPP: 650.0000 mg | RECTAL | Status: DC | PRN

## 2021-05-13 MED ORDER — AMIODARONE HCL IN DEXTROSE 360-4.14 MG/200ML-% IV SOLN
60.0000 mg/h | INTRAVENOUS | Status: AC
  Administered 2021-05-13 – 2021-05-14 (×2): 60 mg/h via INTRAVENOUS

## 2021-05-13 MED ORDER — FENTANYL CITRATE PF 50 MCG/ML IJ SOSY
50.0000 ug | PREFILLED_SYRINGE | Freq: Once | INTRAMUSCULAR | Status: AC
  Administered 2021-05-13: 50 ug via INTRAVENOUS

## 2021-05-13 MED FILL — Medication: Qty: 1 | Status: AC

## 2021-05-13 NOTE — Progress Notes (Addendum)
Bedside echo: no peridcardial effusion, grossly normal biventricular function ? 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  CT still pending at this time. ? ?Sister in room, updated.  Mother en route from [place]. ? ?Myrla Halsted MD PCCM ?

## 2021-05-13 NOTE — Progress Notes (Addendum)
? ?  1640 Patient arrived to [facility] 1226 post code with ROSC achieved. Dr. Katrinka Blazing at bedside. Seizure noted Stat Ativan 2mg  x 2.  Dr. is aware Ceribell placed. Central line and Art Line place.Pending CT head. ?To transfer to [facility] for continuous EEG.  ?

## 2021-05-13 NOTE — Procedures (Signed)
Arterial Catheter Insertion Procedure Note ? ?Laiya Brunelli  ?[id number]  ?[birth year] ? ?Date:05/13/21  ?Time:5:46 PM  ? ? ?Provider Performing: Lorin Glass  ? ? ?Procedure: Insertion of Arterial Line (10175) with US guidance (10258)  ? ?Indication(s) ?Blood pressure monitoring and/or need for frequent ABGs ? ?Consent ?Unable to obtain consent due to emergent nature of procedure. ? ?Anesthesia ?None ? ? ?Time Out ?Verified patient identification, verified procedure, site/side was marked, verified correct patient position, special equipment/implants available, medications/allergies/relevant history reviewed, required imaging and test results available. ? ? ?Sterile Technique ?Maximal sterile technique including full sterile barrier drape, hand hygiene, sterile gown, sterile gloves, mask, hair covering, sterile ultrasound probe cover (if used). ? ? ?Procedure Description ?Area of catheter insertion was cleaned with chlorhexidine and draped in sterile fashion. With real-time ultrasound guidance an arterial catheter was placed into the right femoral artery.  Appropriate arterial tracings confirmed on monitor.   ? ? ?Complications/Tolerance ?None; patient tolerated the procedure well. ? ? ?EBL ?Minimal ? ? ?Specimen(s) ?None ? ?

## 2021-05-13 NOTE — Procedures (Signed)
Central Venous Catheter Insertion Procedure Note ? ?Beth Barnes  ?[id number]  ?Mar 20, 1995 ? ?Date:05/13/21  ?Time:5:45 PM  ? ?Provider Performing:Jibri Schriefer C Katrinka Blazing  ? ?Procedure: Insertion of Non-tunneled Central Venous Catheter(36556) with US guidance (68341)  ? ?Indication(s) ?Difficult access ? ?Consent ?Unable to obtain consent due to emergent nature of procedure. ? ?Anesthesia ?Topical only with 1% lidocaine  ? ?Timeout ?Verified patient identification, verified procedure, site/side was marked, verified correct patient position, special equipment/implants available, medications/allergies/relevant history reviewed, required imaging and test results available. ? ?Sterile Technique ?Maximal sterile technique including full sterile barrier drape, hand hygiene, sterile gown, sterile gloves, mask, hair covering, sterile ultrasound probe cover (if used). ? ?Procedure Description ?Area of catheter insertion was cleaned with chlorhexidine and draped in sterile fashion.  With real-time ultrasound guidance a central venous catheter was placed into the right femoral vein. Nonpulsatile blood flow and easy flushing noted in all ports.  The catheter was sutured in place and sterile dressing applied. ? ?Complications/Tolerance ?None; patient tolerated the procedure well. ?Chest X-ray is ordered to verify placement for internal jugular or subclavian cannulation.   Chest x-ray is not ordered for femoral cannulation. ? ?EBL ?Minimal ? ?Specimen(s) ?None ? ?

## 2021-05-13 NOTE — Consult Note (Signed)
Neurology Consultation ? ?Reason for Consult: Postcardiac arrest myoclonus ?Referring Physician: Dr. Ina Homes, critical care ? ?CC: Postcardiac arrest myoclonus ? ?History is obtained from: Chart ? ?HPI: Beth Barnes is a 26 y.o. 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 ?Presumably visiting boyfriend in the hospital and found down unresponsive with an unknown downtime.  Upon discovering her, CPR started immediately, with about 10 minutes of chest compressions but unknown downtime prior to chest compressions. ?Started exhibiting whole body myoclonic jerking requiring high doses of propofol and Versed and.  Stat rapid EEG negative for seizures but continued to have clinical myoclonic jerking and is up to 70 of propofol and small dose of Versed at this time. ?Given a load of Keppra. ?Transfer orders to [facility] initiated-pending bed availability. ?Urinary toxicology screen obtained after having her on the sedation positive only for benzodiazepines-she is on Versed drip. ? ?Recently spiked a fever as well. ? ?ROS: Unable to obtain due to altered mental status.  ? ?Past Medical History:  ?Diagnosis Date  ? Blind   ? Congenital glaucoma of both eyes   ? Hypokalemia   ? ?Family History  ?Problem Relation Age of Onset  ? Healthy Mother   ? Healthy Father   ? ? ?Social History:  ? reports that she has never smoked. She has never used smokeless tobacco. She reports current alcohol use of about 3.0 standard drinks per week. She reports that she does not currently use drugs. ? ?Medications ? ?Current Facility-Administered Medications:  ?  0.9 %  sodium chloride infusion, 250 mL, Intravenous, Continuous, Candee Furbish, MD ?  acetaminophen (TYLENOL) 160 MG/5ML solution 650 mg, 650 mg, Oral, Q4H **OR** acetaminophen  (TYLENOL) 160 MG/5ML solution 650 mg, 650 mg, Per Tube, Q4H, 650 mg at 05/13/21 1855 **OR** acetaminophen (TYLENOL) suppository 650 mg, 650 mg, Rectal, Q4H, Candee Furbish, MD ?  Derrill Memo ON 05/15/2021] acetaminophen (TYLENOL) 160 MG/5ML solution 650 mg, 650 mg, Per Tube, Q4H PRN **OR** [START ON 05/15/2021] acetaminophen (TYLENOL) 160 MG/5ML solution 650 mg, 650 mg, Per Tube, Q4H PRN **OR** [START ON 05/15/2021] acetaminophen (TYLENOL) suppository 650 mg, 650 mg, Rectal, Q4H PRN, Candee Furbish, MD ?  amiodarone (NEXTERONE PREMIX) 360-4.14 MG/200ML-% (1.8 mg/mL) IV infusion, 60 mg/hr, Intravenous, Continuous, Anders Simmonds, MD, Last Rate: 33.3 mL/hr at 05/13/21 2100, 60 mg/hr at 05/13/21 2100 ?  [START ON 05/14/2021] amiodarone (NEXTERONE PREMIX) 360-4.14 MG/200ML-% (1.8 mg/mL) IV infusion, 30 mg/hr, Intravenous, Continuous, Anders Simmonds, MD, Last Rate: 16.67 mL/hr at 05/13/21 2020, 30 mg/hr at 05/13/21 2020 ?  busPIRone (BUSPAR) tablet 30 mg, 30 mg, Per Tube, Q8H PRN **OR** busPIRone (BUSPAR) tablet 30 mg, 30 mg, Per Tube, Q8H PRN, Candee Furbish, MD ?  cefTRIAXone (ROCEPHIN) 2 g in sodium chloride 0.9 % 100 mL IVPB, 2 g, Intravenous, Q24H, Candee Furbish, MD, Stopped at 05/13/21 2030 ?  chlorhexidine gluconate (MEDLINE KIT) (PERIDEX) 0.12 % solution 15 mL, 15 mL, Mouth Rinse, BID, Candee Furbish, MD, 15 mL at 05/13/21 2114 ?  Chlorhexidine Gluconate Cloth 2 % PADS 6 each, 6 each, Topical, Daily, Candee Furbish, MD, 6 each at 05/13/21 2054 ?  docusate (COLACE) 50 MG/5ML liquid 100 mg, 100 mg, Per Tube, BID PRN, Candee Furbish, MD ?  docusate (COLACE) 50 MG/5ML liquid 100  mg, 100 mg, Per Tube, BID, Candee Furbish, MD ?  fentaNYL (SUBLIMAZE) bolus via infusion 50-100 mcg, 50-100 mcg, Intravenous, Q15 min PRN, Candee Furbish, MD, 100 mcg at 05/13/21 1826 ?  fentaNYL 2566mg in NS 2510m(1038mml) infusion-PREMIX, 50-200 mcg/hr, Intravenous, Continuous, SmiCandee FurbishD, Last Rate: 10 mL/hr at 05/13/21 2100,  100 mcg/hr at 05/13/21 2100 ?  folic acid (FOLVITE) tablet 1 mg, 1 mg, Per Tube, Daily, SmiCandee FurbishD, 1 mg at 05/13/21 1855 ?  insulin aspart (novoLOG) injection 0-9 Units, 0-9 Units, Subcutaneous, Q4H, SmiCandee FurbishD, 5 Units at 05/13/21 1836 ?  LORazepam (ATIVAN) injection 2 mg, 2 mg, Intravenous, Q4H PRN, SmiCandee FurbishD, 4 mg at 05/13/21 1630 ?  magnesium sulfate IVPB 2 g 50 mL, 2 g, Intravenous, Once PRN, SmiCandee FurbishD, Stopped at 05/13/21 1855 ?  MEDLINE mouth rinse, 15 mL, Mouth Rinse, 10 times per day, SmiCandee FurbishD ?  midazolam (VERSED) 100 mg/100 mL (1 mg/mL) premix infusion, 0-20 mg/hr, Intravenous, Continuous, SmiCandee FurbishD, Last Rate: 0.5 mL/hr at 05/13/21 2100, 0.5 mg/hr at 05/13/21 2100 ?  norepinephrine (LEVOPHED) 4mg42m 250mL24m016 mg/mL) premix infusion, 0-40 mcg/min, Intravenous, Titrated, SmithCandee Furbish Last Rate: 45 mL/hr at 05/13/21 2100, 12 mcg/min at 05/13/21 2100 ?  ondansetron (ZOFRAN) injection 4 mg, 4 mg, Intravenous, Q6H PRN, SmithCandee Furbish?  pantoprazole (PROTONIX) injection 40 mg, 40 mg, Intravenous, QHS, SmithCandee Furbish 40 mg at 05/13/21 2114 ?  polyethylene glycol (MIRALAX / GLYCOLAX) packet 17 g, 17 g, Per Tube, Daily PRN, SmithCandee Furbish?  polyethylene glycol (MIRALAX / GLYCOLAX) packet 17 g, 17 g, Per Tube, Daily, SmithCandee Furbish 17 g at 05/13/21 1855 ?  propofol (DIPRIVAN) 1000 MG/100ML infusion, 0-80 mcg/kg/min, Intravenous, Continuous, SmithCandee Furbish Last Rate: 27.3 mL/hr at 05/13/21 2100, 70 mcg/kg/min at 05/13/21 2100 ?  thiamine tablet 100 mg, 100 mg, Per Tube, Daily, SmithCandee Furbish 100 mg at 05/13/21 1855 [id number]?Exam: ?Current vital signs: ?BP 117/81   Pulse (!) 111   Temp (!) 102.2 ?F (39 ?C)   Resp 18   Ht 5' 6" (1.676 m)   Wt 65 kg   SpO2 100%   BMI 23.13 kg/m?  ?Vital signs in last 24 hours: ?Temp:  [99.1 ?F (37.3 ?C)-102.2 ?F (39 ?C)] 102.2 ?F (39 ?C) (04/04 2100) ?Pulse Rate:  [85-116] 111  (04/04 2100) ?Resp:  [18-37] 18 (04/04 2100) ?BP: (117)/(81) 117/81 (04/04 1700) ?SpO2:  [90 %-100 %] 100 % (04/04 2100) ?Arterial Line BP: (73-210)/(43-146) 95/60 (04/04 2100) ?FiO2 (%):  [100 %] 100 % (04/04 1632) ?Weight:  [65 kg] 65 kg (04/04 2023) ?General: Sedated intubated ?HEENT: Normocephalic atraumatic ?Lungs: Vented ?Cardiovascular: Regular rate rhythm ?Abdomen nondistended nontender ?Extremities warm well perfused without edema ?Neurological exam ?Sedated intubated on propofol and Versed ?Propofol being down titrated. ?Cranial nerves: Complete corneal opacities bilaterally, unable to assess gaze, face appears symmetric with the caveat that the endotracheal tube makes exam difficult. ?Motor examination: No spontaneous movements at the time of my examination.  No movement to noxious simulation ?Sensory exam: As above ? ? ?Labs ?I have reviewed labs in epic and the results pertinent to this consultation are: ? ?CBC ?   ?Component Value Date/Time  ? WBC 15.8 (H) 05/13/2021 1740  ? RBC 4.40 05/13/2021 1740  ? HGB 13.1 05/13/2021 1740  ? HCT  42.3 05/13/2021 1740  ? PLT 210 05/13/2021 1740  ? MCV 96.1 05/13/2021 1740  ? MCH 29.8 05/13/2021 1740  ? MCHC 31.0 05/13/2021 1740  ? RDW 20.7 (H) 05/13/2021 1740  ? LYMPHSABS 0.9 12/26/2020 1839  ? MONOABS 0.7 12/26/2020 1839  ? EOSABS 0.0 12/26/2020 1839  ? BASOSABS 0.1 12/26/2020 1839  ? ? ?CMP  ?   ?Component Value Date/Time  ? NA 137 05/13/2021 1740  ? K 3.2 (L) 05/13/2021 1740  ? CL 97 (L) 05/13/2021 1740  ? CO2 18 (L) 05/13/2021 1740  ? GLUCOSE 245 (H) 05/13/2021 1740  ? BUN 11 05/13/2021 1740  ? CREATININE 0.86 05/13/2021 1740  ? CALCIUM 8.1 (L) 05/13/2021 1740  ? PROT 8.4 (H) 12/26/2020 1839  ? ALBUMIN 4.5 12/26/2020 1839  ? AST 264 (H) 12/26/2020 1839  ? ALT 129 (H) 12/26/2020 1839  ? ALKPHOS 81 12/26/2020 1839  ? BILITOT 2.9 (H) 12/26/2020 1839  ? GFRNONAA >60 05/13/2021 1740  ? GFRAA >60 09/17/2019 0537  ? ?Imaging ?I have reviewed the images  obtained: ? ?CT-head-pending ?Chest x-ray with endotracheal tube tip 2.9 cm above the carina with bilateral upper lobe airspace disease left greater than right-infection versus hemorrhage. ?Abdominal x-ray-distal tip of NG tub

## 2021-05-13 NOTE — Progress Notes (Signed)
eLink Physician-Brief Progress Note ?Patient Name: Beth Barnes ?DOB: 18-Sep-1995 ?MRN: [id number] ? ? ?Date of Service ? 05/13/2021  ?HPI/Events of Note ? Fever to 102.0 F in spite of Tylenol and ice packs. Nursing request for cooling blanket.   ?eICU Interventions ? Plan: ?Cooling blanket PRN.  ? ? ? ?Intervention Category ?Major Interventions: Other: ? ?Yosef Krogh Dennard Nip ?05/13/2021, 9:48 PM ?

## 2021-05-13 NOTE — H&P (Signed)
? ?NAME:  Beth Barnes, MRN:  [id number], DOB:  02/07/1996, LOS: 0 ?ADMISSION DATE:  05/13/2021, CONSULTATION DATE:  05/13/21 ?REFERRING MD:  N/A, CHIEF COMPLAINT:  Cardiac arrest  ? ?History of Present Illness:  ?26 year old woman with hx of blindness, polysubstance abuse who was visiting boyfriend in [place] and found unresponsive, unknown downtime.  CPR begun immediately (see separate note).  CRNA intubated.  Initial rhythm vfib.  ROSC obtained and patient direct admitted to ICU.  History obtained from boyfriend. She left briefly and then came back.  History of cocaine/etoh/MJ abuse.  He denies she has any IVDA or opiate use.  All her family is in [place], he will try to find contact info. ? ?Pertinent  Medical History  ? ?Past Medical History:  ?Diagnosis Date  ? Blind   ? Congenital glaucoma of both eyes   ? Glaucoma   ? Hypokalemia   ? ? ?Significant Hospital Events: ?Including procedures, antibiotic start and stop dates in addition to other pertinent events   ?4/4 cardiac arrest ~10 min downtime ? ?Interim History / Subjective:  ?Consulted ? ?Objective   ?Height 5\' 6"  (1.676 m). ?   ?Vent Mode: PRVC ?FiO2 (%):  [100 %] 100 % ?Set Rate:  [18 bmp] 18 bmp ?Vt Set:  [470 mL] 470 mL ?PEEP:  [5 cmH20] 5 cmH20  ?No intake or output data in the 24 hours ending 05/13/21 1701 ?There were no vitals filed for this visit. ? ?Examination: ?General: young woman unresponsive in bed ?HENT: glaucoma both eyes ?Lungs: Rhonci bilaterally, triggering vent ?Cardiovascular: Regular with PVCs on monitor, ext warm ?Abdomen: Soft, +BS ?Extremities: no edema ?Neuro: GCS3 at present ?Skin: no rashes or track marks ? ?No labs ? ?Resolved Hospital Problem list   ?N/A ? ?Assessment & Plan:  ?Cardiac arrest- initial rhythm vfib vs. Torsades shocked x 3, ~10 min downtime. ?Hx HTN ?Hx cocaine and marijuana abuse ?Post arrest encephalopathy ? ?- Stat labs, CT head, EKG (see orders) ?- Amio no bolus, mag x 4g empirically ?- Will do bedside echo ?- Dr.  07/13/21 to see ?- TTM2 if no large bleed on CT ?- Work on finding family ?- Vent support ?- CTX empirically for aspiration, CXR/KUB pending ?- Check urine pregnancy ? ?Addendum: called Mom: [phone], 10[birth year]12-2000- other than glaucoma only issue has been alcohol abuse.  Will add thiamine/folate. ? ?Best Practice (right click and "Reselect all SmartList Selections" daily)  ? ?Diet/type: NPO ?DVT prophylaxis: SCD ?GI prophylaxis: PPI ?Lines: Central line ?Foley:  Yes, and it is still needed ?Code Status:  full code ?Last date of multidisciplinary goals of care discussion [pending] ? ?Labs   ?CBC: ?No results for input(s): WBC, NEUTROABS, HGB, HCT, MCV, PLT in the last 168 hours. ? ?Basic Metabolic Panel: ?No results for input(s): NA, K, CL, CO2, GLUCOSE, BUN, CREATININE, CALCIUM, MG, PHOS in the last 168 hours. ?GFR: ?CrCl cannot be calculated (Patient's most recent lab result is older than the maximum 21 days allowed.). ?No results for input(s): PROCALCITON, WBC, LATICACIDVEN in the last 168 hours. ? ?Liver Function Tests: ?No results for input(s): AST, ALT, ALKPHOS, BILITOT, PROT, ALBUMIN in the last 168 hours. ?No results for input(s): LIPASE, AMYLASE in the last 168 hours. ?No results for input(s): AMMONIA in the last 168 hours. ? ?ABG ?No results found for: PHART, PCO2ART, PO2ART, HCO3, TCO2, ACIDBASEDEF, O2SAT  ? ?Coagulation Profile: ?No results for input(s): INR, PROTIME in the last 168 hours. ? ?Cardiac Enzymes: ?No results for input(s):  CKTOTAL, CKMB, CKMBINDEX, TROPONINI in the last 168 hours. ? ?HbA1C: ?No results found for: HGBA1C ? ?CBG: ?No results for input(s): GLUCAP in the last 168 hours. ? ?Review of Systems:   ?comatose ? ?Past Medical History:  ?She,  has a past medical history of Blind, Congenital glaucoma of both eyes, Glaucoma, and Hypokalemia.  ? ?Surgical History:  ? ?Past Surgical History:  ?Procedure Laterality Date  ? EYE SURGERY    ?  ? ?Social History:  ? reports that she has never smoked.  She has never used smokeless tobacco. She reports current alcohol use of about 3.0 standard drinks per week. She reports that she does not currently use drugs.  ? ?Family History:  ?Her family history includes Healthy in her father and mother.  ? ?Allergies ?Allergies  ?Allergen Reactions  ? Cetirizine & Related Hives  ?  ? ?Home Medications  ?Prior to Admission medications   ?Medication Sig Start Date End Date Taking? Authorizing Provider  ?clonazePAM (KLONOPIN) 0.5 MG tablet Take 0.5 mg by mouth daily as needed. 11/22/19   [provider]  ?famotidine (PEPCID) 20 MG tablet Take 1 tablet (20 mg total) by mouth 2 (two) times daily. ?Patient not taking: No sig reported 02/01/20   Pricilla Loveless, MD  ?ibuprofen (ADVIL) 200 MG tablet Take 200-400 mg by mouth every 6 (six) hours as needed for cramping or mild pain.    [provider]  ?meclizine (ANTIVERT) 25 MG tablet Take 1 tablet (25 mg total) by mouth 3 (three) times daily as needed for dizziness or nausea. 11/30/20   Arthor Captain, PA-C  ?ondansetron (ZOFRAN ODT) 4 MG disintegrating tablet Take 1 tablet (4 mg total) by mouth every 8 (eight) hours as needed for nausea or vomiting. ?Patient not taking: No sig reported 02/01/20   Pricilla Loveless, MD  ?pantoprazole (PROTONIX) 40 MG tablet Take 1 tablet (40 mg total) by mouth daily. ?Patient not taking: No sig reported 02/01/20   Pricilla Loveless, MD  ?potassium chloride SA (KLOR-CON) 20 MEQ tablet Take 1 tablet (20 mEq total) by mouth 2 (two) times daily. ?Patient not taking: No sig reported 12/23/19   Mancel Bale, MD  ?propranolol (INDERAL) 40 MG tablet Take by mouth at bedtime. 11/16/19   [provider]  ?triamcinolone cream (KENALOG) 0.1 % Apply 2 x daily to the affected area. 11/30/20   Arthor Captain, PA-C  ?  ? ?Critical care time: 120 minutes not including any separately billable procedures ?  ? ? ? ? ? ?

## 2021-05-13 NOTE — Progress Notes (Signed)
Patient transported on ventilator to CT and back with no complications. Vitals stable.  ?

## 2021-05-13 NOTE — Procedures (Addendum)
Patient Name: Beth Barnes  ?MRN: [id number]  ?Epilepsy Attending: Charlsie Quest  ?Referring Physician/Provider: Lorin Glass, MD ?Duration: 05/13/2021 1711 to 2302 ? ?Patient history: 26 year old woman with hx of blindness, polysubstance abuse who was visiting boyfriend in [place] and found unresponsive, unknown downtime. Now with myoclonus. EEG to evaluate for seizure ? ?Level of alertness:  comatose ? ?AEDs during EEG study: None ? ?Technical aspects: This EEG was obtained using a 10 lead EEG system positioned circumferentially without any parasagittal coverage (rapid EEG). Computer selected EEG is reviewed as  well as background features and all clinically significant events. ? ?Description: EEG showed continuous generalized low amplitude 3 to 6 Hz theta-delta slowing admixed with 15-18hz  generalized beta activity. Hyperventilation and photic stimulation were not performed.    ? ?ABNORMALITY ?- Continuous slow, generalized ? ?IMPRESSION: ?This limited ceribell EEG is suggestive of severe diffuse encephalopathy, nonspecific etiology. No seizures or epileptiform discharges were seen throughout the recording. ? ?If suspicion for ictal-interictal activity remains a concern, a conventional EEG can be considered.  ? ?Dr Katrinka Blazing was notified.  ? ?Charlsie Quest  ? ?

## 2021-05-13 NOTE — Consult Note (Signed)
? ?Cardiology Consultation:  ? ?Patient ID: Beth Barnes; MC:[id number]; 08-15-1995  ? ?Admit date: 05/13/2021 ?Date of Consult: 05/13/2021 ? ?Primary Care Provider: Patient, No Pcp Per (Inactive) ?Primary Cardiologist: None  ?Primary Electrophysiologist:  None ? ? ?Patient Profile:  ? ?Beth Barnes is a 26 y.o. female without prior cardiac history who is being seen for evaluation of who is being seen today for the evaluation of V-fib arrest at the request of Dr. Tamala Julian. ? ?History of Present Illness:  ? ?Beth Barnes has no past cardiac history per conversation [place].  She has EtOH use.  She was visiting a friend in the hospital when she had a witnessed arrest.  Talking to the nurses they attended to this immediately.  She was found to be in V-fib.  She did require epinephrine, cardioversion x2, sodium bicarb.  She was treated with Narcan.  She eventually returned sinus tachycardia.  She is now moved to the ICU where she is intubated and unresponsive.  Is having intermittent seizures with sinus tachycardia and hypotensive response normalizes when seizure activity is normalized.  She is being managed for this. ? ?Past Medical History:  ?Diagnosis Date  ? Blind   ? Congenital glaucoma of both eyes   ? Glaucoma   ? Hypokalemia   ? ? ?Past Surgical History:  ?Procedure Laterality Date  ? EYE SURGERY    ?  ? ?Home Medications:  ?Prior to Admission medications   ?Medication Sig Start Date End Date Taking? Authorizing Provider  ?clonazePAM (KLONOPIN) 0.5 MG tablet Take 0.5 mg by mouth daily as needed. 11/22/19   [provider]  ?famotidine (PEPCID) 20 MG tablet Take 1 tablet (20 mg total) by mouth 2 (two) times daily. ?Patient not taking: No sig reported 02/01/20   Sherwood Gambler, MD  ?ibuprofen (ADVIL) 200 MG tablet Take 200-400 mg by mouth every 6 (six) hours as needed for cramping or mild pain.    [provider]  ?meclizine (ANTIVERT) 25 MG tablet Take 1 tablet (25 mg total) by mouth 3  (three) times daily as needed for dizziness or nausea. 11/30/20   Margarita Mail, PA-C  ?ondansetron (ZOFRAN ODT) 4 MG disintegrating tablet Take 1 tablet (4 mg total) by mouth every 8 (eight) hours as needed for nausea or vomiting. ?Patient not taking: No sig reported 02/01/20   Sherwood Gambler, MD  ?pantoprazole (PROTONIX) 40 MG tablet Take 1 tablet (40 mg total) by mouth daily. ?Patient not taking: No sig reported 02/01/20   Sherwood Gambler, MD  ?potassium chloride SA (KLOR-CON) 20 MEQ tablet Take 1 tablet (20 mEq total) by mouth 2 (two) times daily. ?Patient not taking: No sig reported 12/23/19   Daleen Bo, MD  ?propranolol (INDERAL) 40 MG tablet Take by mouth at bedtime. 11/16/19   [provider]  ?triamcinolone cream (KENALOG) 0.1 % Apply 2 x daily to the affected area. 11/30/20   Margarita Mail, PA-C  ? ? ?Inpatient Medications: ?Scheduled Meds: ? acetaminophen  650 mg Oral Q4H  ? Or  ? acetaminophen (TYLENOL) oral liquid 160 mg/5 mL  650 mg Per Tube Q4H  ? Or  ? acetaminophen  650 mg Rectal Q4H  ? docusate  100 mg Per Tube BID  ? etomidate      ? fentaNYL (SUBLIMAZE) injection  50 mcg Intravenous Once  ? folic acid  1 mg Per Tube Daily  ? insulin aspart  0-9 Units Subcutaneous Q4H  ? LORazepam      ?  pantoprazole (PROTONIX) IV  40 mg Intravenous QHS  ? polyethylene glycol  17 g Per Tube Daily  ? rocuronium bromide      ? thiamine  100 mg Per Tube Daily  ? ?Continuous Infusions: ? sodium chloride    ? cefTRIAXone (ROCEPHIN)  IV    ? fentaNYL infusion INTRAVENOUS    ? magnesium sulfate    ? propofol (DIPRIVAN) infusion    ? ?PRN Meds: ?[START ON 05/15/2021] acetaminophen **OR** [START ON 05/15/2021] acetaminophen (TYLENOL) oral liquid 160 mg/5 mL **OR** [START ON 05/15/2021] acetaminophen, busPIRone **OR** busPIRone, docusate, fentaNYL, LORazepam, magnesium sulfate, ondansetron (ZOFRAN) IV, polyethylene glycol ? ?Allergies:    ?Allergies  ?Allergen Reactions  ? Cetirizine & Related Hives  ? ? ?Social  History:   ?Social History  ? ?Socioeconomic History  ? Marital status: Single  ?  Spouse name: Not on file  ? Number of children: Not on file  ? Years of education: Not on file  ? Highest education level: Not on file  ?Occupational History  ? Not on file  ?Tobacco Use  ? Smoking status: Never  ? Smokeless tobacco: Never  ?Vaping Use  ? Vaping Use: Never used  ?Substance and Sexual Activity  ? Alcohol use: Yes  ?  Alcohol/week: 3.0 standard drinks  ?  Types: 3 Shots of liquor per week  ?  Comment: Every other day  ? Drug use: Not Currently  ? Sexual activity: Not on file  ?Other Topics Concern  ? Not on file  ?Social History Narrative  ? Not on file  ? ?Social Determinants of Health  ? ?Financial Resource Strain: Not on file  ?Food Insecurity: Not on file  ?Transportation Needs: Not on file  ?Physical Activity: Not on file  ?Stress: Not on file  ?Social Connections: Not on file  ?Intimate Partner Violence: Not on file  ?  ?Family History:   ? ?Family History  ?Problem Relation Age of Onset  ? Healthy Mother   ? Healthy Father   ?  ? ?ROS:  ?Please see the history of present illness.  ?Unable to obtain ? ?Physical Exam/Data:  ? ?Vitals:  ? 05/13/21 1632  ?Height: 5\' 6"  (1.676 m)  ? ?No intake or output data in the 24 hours ending 05/13/21 1728 ?There were no vitals filed for this visit. ?Body mass index is 23.89 kg/m?.  ?GENERAL: Critically ill appearing ?HEENT:  Pupils equal round and reactive, fundi not visualized, oral mucosa unremarkable ?NECK:  No jugular venous distention, waveform within normal limits, carotid upstroke brisk and symmetric, no bruits, no thyromegaly ?LYMPHATICS:  No cervical, inguinal adenopathy ?LUNGS:  Clear to auscultation bilaterally ?BACK:  No CVA tenderness ?CHEST:  Unremarkable ?HEART:  PMI not displaced or sustained,S1 and S2 within normal limits, no S3, no S4, no clicks, no rubs, no murmurs ?ABD:  Flat, positive bowel sounds normal in frequency in pitch, no bruits, no rebound, no  guarding, no midline pulsatile mass, no hepatomegaly, no splenomegaly ?EXT:  2 plus pulses throughout, no edema, no cyanosis no clubbing ?SKIN:  No rashes no nodules ?NEURO:  Cranial nerves II through XII grossly intact, motor grossly intact throughout ?PSYCH:  Cognitively intact, oriented to person place and time ? ? ? ?EKG:  The EKG was personally reviewed and demonstrates: Sinus tachycardia, rate 108, QT prolonged. ?Telemetry:  Telemetry was personally reviewed and demonstrates: See above ? ?Relevant CV Studies: ? ?Bedside echo per Dr. Tamala Julian with preserved left ventricular function and no effusion. ? ?  Laboratory Data: ? ?ChemistryNo results for input(s): NA, K, CL, CO2, GLUCOSE, BUN, CREATININE, CALCIUM, GFRNONAA, GFRAA, ANIONGAP in the last 168 hours.  ?No results for input(s): PROT, ALBUMIN, AST, ALT, ALKPHOS, BILITOT in the last 168 hours. ?HematologyNo results for input(s): WBC, RBC, HGB, HCT, MCV, MCH, MCHC, RDW, PLT in the last 168 hours. ?Cardiac EnzymesNo results for input(s): TROPONINI in the last 168 hours. No results for input(s): TROPIPOC in the last 168 hours.  ?BNPNo results for input(s): BNP, PROBNP in the last 168 hours.  ?DDimer No results for input(s): DDIMER in the last 168 hours. ? ?Radiology/Studies:  ?No results found. ? ?Assessment and Plan:  ? ?V-fib arrest: Labs outstanding.  There is a prolonged QT which might be secondary.  Currently receiving supportive care.  Agree with IV amiodarone.  Avoiding further QT prolonging drugs.  We will complete a formal echocardiogram.  We will follow along with you.  No suspicion of acute coronary syndrome based on initial EKG.  Of note there was a prolonged QT noted on previous EKG.  ? ? ? ?For questions or updates, please contact [facility] ?Please consult [email or web address] for contact info under Cardiology/STEMI. ?  ?Signed, ?Minus Breeding, MD  ?05/13/2021 5:28 PM ? ? ?

## 2021-05-13 NOTE — Procedures (Signed)
Cardiopulmonary Resuscitation Note ? ?Beth Barnes  ?[id number]  ?Sep 12, 1995 ? ?Date:05/13/21  ?Time:5:14 PM  ? ?Provider Performing:Mery Guadalupe C Katrinka Blazing  ? ?Procedure: Cardiopulmonary Resuscitation [phone]) ? ?Indication(s) ?Loss of Pulse ? ?Consent ?N/A ? ?Anesthesia ?N/A ? ? ?Time Out ?N/A ? ? ?Sterile Technique ?Hand hygiene, gloves ? ? ?Procedure Description ?Called to patient's room for CODE BLUE. Initial rhythm was Vfib/Vtach. Patient received high quality chest compressions for 10 minutes with defibrillation or cardioversion when appropriate. Epinephrine was administered every 3 minutes as directed by time Biomedical engineer. Additional pharmacologic interventions included sodium bicarbonate. Additional procedural interventions include intra-osseus line.  Return of spontaneous circulation was achieved. ? ?Family called and notified. ? ? ?Complications/Tolerance ?N/A ? ? ?EBL ?N/A ? ? ?Specimen(s) ?N/A ? ?Estimated time to ROSC: 10 minutes ?Est downtime prior to compressions unkown ? ?

## 2021-05-13 NOTE — Anesthesia Procedure Notes (Addendum)
Procedure Name: Intubation ?Date/Time: 05/13/2021 3:50 PM ?Performed by: Ezekiel Ina, CRNA ?Pre-anesthesia Checklist: Patient identified, Emergency Drugs available, Suction available and Patient being monitored ?Patient Re-evaluated:Patient Re-evaluated prior to induction ?Oxygen Delivery Method: Ambu bag ?Preoxygenation: Pre-oxygenation with 100% oxygen ?Laryngoscope Size: Glidescope and 3 ?Grade View: Grade I ?Tube type: Oral ?Tube size: 7.5 mm ?Number of attempts: 1 ?Airway Equipment and Method: Stylet ?Placement Confirmation: ETT inserted through vocal cords under direct vision, positive ETCO2 and breath sounds checked- equal and bilateral ?Secured at: 24 cm ?Tube secured with: Tape ?Dental Injury: Teeth and Oropharynx as per pre-operative assessment  ?Comments: Code [employer]. Patient on couch. Glidescope used.  ? ? ? ? ?

## 2021-05-13 NOTE — Progress Notes (Signed)
Chaplain called to ICU for attending to family that is present for patient.  Patient unable to communicate.  Sister [place] spoke at length regarding her sisters' collapse and her shock - [place] is blind and needed attending to as well.  Spoke with staff and with Sister at bedside.  Accompanied her to rest room and confirmed her ability to have refreshments and water and food as needed.  Sister said "I think she had medical problems and I feel like Im in shock, My Mom is coming up from [place] tonight. I had to go the the rest room to cry cause I don't want anyone to see me cry."  Chaplain provided spiritual and emotional support for her as she shared about her sister Alexus.     ?Conferred with staff and assured [place] of continued support from [place].   ? ?Ended visit with a departing blessing.   ? ? ? 05/13/21 2000  ?Clinical Encounter Type  ?Visited With Patient and family together  ?Visit Type Initial;Code  ?Referral From Nurse  ?Consult/Referral To Chaplain  ?Spiritual Encounters  ?Spiritual Needs Prayer;Emotional  ?Stress Factors  ?Patient Stress Factors None identified  ?Family Stress Factors Family relationships;Health changes;Lack of caregivers;Lack of knowledge;Loss;Loss of control;Major life changes  ? ? ?

## 2021-05-13 NOTE — Progress Notes (Addendum)
eLink Physician-Brief Progress Note ?Patient Name: Anayansi Pingley ?DOB: [birth year] ?MRN: [id number] ? ? ?Date of Service ? 05/13/2021  ?HPI/Events of Note ? Multiple issues: 1. Agitation - Nursing request for bilateral soft wrist restraints. 2. Lactic Acid > 9 and 3. Troponin #1 = 280. Both elevated Lactic Acid and Troponin in setting of post cardiac arrest and CPR. 4. Amiodarone IV infusion running, however, no order for Amiodarone IV infusion.   ?eICU Interventions ? Plan: ?Bilateral soft wrist restraints X 13 hours.  ?Continue to trend Lactic Acid and Troponin.  ?Amiodarone IV infusion protocol orders entered.   ? ? ? ?Intervention Category ?Major Interventions: Delirium, psychosis, severe agitation - evaluation and management;Acid-Base disturbance - evaluation and management;Other: ? ?Chaska Hagger Cornelia Copa ?05/13/2021, 8:08 PM ?

## 2021-05-14 ENCOUNTER — Inpatient Hospital Stay (HOSPITAL_COMMUNITY): Payer: Medicaid Other

## 2021-05-14 ENCOUNTER — Encounter (HOSPITAL_COMMUNITY): Payer: Self-pay | Admitting: Internal Medicine

## 2021-05-14 DIAGNOSIS — G931 Anoxic brain damage, not elsewhere classified: Secondary | ICD-10-CM | POA: Diagnosis not present

## 2021-05-14 DIAGNOSIS — I469 Cardiac arrest, cause unspecified: Secondary | ICD-10-CM | POA: Diagnosis not present

## 2021-05-14 DIAGNOSIS — R570 Cardiogenic shock: Secondary | ICD-10-CM

## 2021-05-14 DIAGNOSIS — I5021 Acute systolic (congestive) heart failure: Secondary | ICD-10-CM

## 2021-05-14 DIAGNOSIS — G253 Myoclonus: Secondary | ICD-10-CM

## 2021-05-14 LAB — CBC
HCT: 38.1 % (ref 36.0–46.0)
HCT: 38.1 % (ref 36.0–46.0)
Hemoglobin: 12.8 g/dL (ref 12.0–15.0)
Hemoglobin: 12.8 g/dL (ref 12.0–15.0)
MCH: 30 pg (ref 26.0–34.0)
MCH: 30 pg (ref 26.0–34.0)
MCHC: 33.6 g/dL (ref 30.0–36.0)
MCHC: 33.6 g/dL (ref 30.0–36.0)
MCV: 89.2 fL (ref 80.0–100.0)
MCV: 89.2 fL (ref 80.0–100.0)
Platelets: 250 10*3/uL (ref 150–400)
Platelets: 250 10*3/uL (ref 150–400)
RBC: 4.27 MIL/uL (ref 3.87–5.11)
RBC: 4.27 MIL/uL (ref 3.87–5.11)
RDW: 20.5 % — ABNORMAL HIGH (ref 11.5–15.5)
RDW: 20.5 % — ABNORMAL HIGH (ref 11.5–15.5)
WBC: 19 10*3/uL — ABNORMAL HIGH (ref 4.0–10.5)
WBC: 19 10*3/uL — ABNORMAL HIGH (ref 4.0–10.5)
nRBC: 1.1 % — ABNORMAL HIGH (ref 0.0–0.2)
nRBC: 1.1 % — ABNORMAL HIGH (ref 0.0–0.2)

## 2021-05-14 LAB — COMPREHENSIVE METABOLIC PANEL
ALT: 110 U/L — ABNORMAL HIGH (ref 0–44)
AST: 211 U/L — ABNORMAL HIGH (ref 15–41)
Albumin: 2.7 g/dL — ABNORMAL LOW (ref 3.5–5.0)
Alkaline Phosphatase: 56 U/L (ref 38–126)
Anion gap: 10 (ref 5–15)
BUN: 10 mg/dL (ref 6–20)
CO2: 25 mmol/L (ref 22–32)
Calcium: 8 mg/dL — ABNORMAL LOW (ref 8.9–10.3)
Chloride: 97 mmol/L — ABNORMAL LOW (ref 98–111)
Creatinine, Ser: 1.08 mg/dL — ABNORMAL HIGH (ref 0.44–1.00)
GFR, Estimated: >60 mL/min (ref >60)
Glucose, Bld: 197 mg/dL — ABNORMAL HIGH (ref 70–99)
Potassium: 3.5 mmol/L (ref 3.5–5.1)
Sodium: 132 mmol/L — ABNORMAL LOW (ref 135–145)
Total Bilirubin: 0.6 mg/dL (ref 0.3–1.2)
Total Protein: 5.6 g/dL — ABNORMAL LOW (ref 6.5–8.1)

## 2021-05-14 LAB — ECHOCARDIOGRAM COMPLETE
AR max vel: 1.29 cm2
AV Area VTI: 1.43 cm2
AV Area mean vel: 1.19 cm2
AV Mean grad: 1 mmHg
AV Peak grad: 2 mmHg
Ao pk vel: 0.71 m/s
Area-P 1/2: 3.05 cm2
Height: 66 in
S' Lateral: 3.3 cm
Weight: 2292.78 oz

## 2021-05-14 LAB — GLUCOSE, CAPILLARY
Glucose-Capillary: 212 mg/dL — ABNORMAL HIGH (ref 70–99)
Glucose-Capillary: 118 mg/dL — ABNORMAL HIGH (ref 70–99)
Glucose-Capillary: 113 mg/dL — ABNORMAL HIGH (ref 70–99)
Glucose-Capillary: 139 mg/dL — ABNORMAL HIGH (ref 70–99)
Glucose-Capillary: 152 mg/dL — ABNORMAL HIGH (ref 70–99)
Glucose-Capillary: 122 mg/dL — ABNORMAL HIGH (ref 70–99)

## 2021-05-14 LAB — POCT I-STAT 7, (LYTES, BLD GAS, ICA,H+H)
Acid-Base Excess: 1 mmol/L (ref 0.0–2.0)
Acid-Base Excess: 3 mmol/L — ABNORMAL HIGH (ref 0.0–2.0)
Bicarbonate: 23.2 mmol/L (ref 20.0–28.0)
Bicarbonate: 26 mmol/L (ref 20.0–28.0)
Calcium, Ion: 1.16 mmol/L (ref 1.15–1.40)
Calcium, Ion: 1.09 mmol/L — ABNORMAL LOW (ref 1.15–1.40)
HCT: 41 % (ref 36.0–46.0)
HCT: 40 % (ref 36.0–46.0)
Hemoglobin: 13.9 g/dL (ref 12.0–15.0)
Hemoglobin: 13.6 g/dL (ref 12.0–15.0)
O2 Saturation: 98 %
O2 Saturation: 100 %
Patient temperature: 35.6
Patient temperature: 36.3
Potassium: 2.8 mmol/L — ABNORMAL LOW (ref 3.5–5.1)
Potassium: 3.8 mmol/L (ref 3.5–5.1)
Sodium: 131 mmol/L — ABNORMAL LOW (ref 135–145)
Sodium: 131 mmol/L — ABNORMAL LOW (ref 135–145)
TCO2: 24 mmol/L (ref 22–32)
TCO2: 27 mmol/L (ref 22–32)
pCO2 arterial: 28.6 mmHg — ABNORMAL LOW (ref 32–48)
pCO2 arterial: 34.4 mmHg (ref 32–48)
pH, Arterial: 7.511 — ABNORMAL HIGH (ref 7.35–7.45)
pH, Arterial: 7.484 — ABNORMAL HIGH (ref 7.35–7.45)
pO2, Arterial: 94 mmHg (ref 83–108)
pO2, Arterial: 219 mmHg — ABNORMAL HIGH (ref 83–108)

## 2021-05-14 LAB — BASIC METABOLIC PANEL
Anion gap: 11 (ref 5–15)
Anion gap: 11 (ref 5–15)
BUN: 11 mg/dL (ref 6–20)
BUN: 11 mg/dL (ref 6–20)
CO2: 24 mmol/L (ref 22–32)
CO2: 24 mmol/L (ref 22–32)
Calcium: 8.5 mg/dL — ABNORMAL LOW (ref 8.9–10.3)
Calcium: 8.5 mg/dL — ABNORMAL LOW (ref 8.9–10.3)
Chloride: 97 mmol/L — ABNORMAL LOW (ref 98–111)
Chloride: 97 mmol/L — ABNORMAL LOW (ref 98–111)
Creatinine, Ser: 0.77 mg/dL (ref 0.44–1.00)
Creatinine, Ser: 0.77 mg/dL (ref 0.44–1.00)
GFR, Estimated: >60 mL/min (ref >60)
GFR, Estimated: >60 mL/min (ref >60)
Glucose, Bld: 148 mg/dL — ABNORMAL HIGH (ref 70–99)
Glucose, Bld: 148 mg/dL — ABNORMAL HIGH (ref 70–99)
Potassium: 2.4 mmol/L — CL (ref 3.5–5.1)
Potassium: 2.4 mmol/L — CL (ref 3.5–5.1)
Sodium: 132 mmol/L — ABNORMAL LOW (ref 135–145)
Sodium: 132 mmol/L — ABNORMAL LOW (ref 135–145)

## 2021-05-14 LAB — TYPE AND SCREEN
ABO/RH(D): O POS
Antibody Screen: NEGATIVE

## 2021-05-14 LAB — TROPONIN I (HIGH SENSITIVITY): Troponin I (High Sensitivity): 3101 ng/L (ref <18)

## 2021-05-14 LAB — HEMOGLOBIN A1C
Hgb A1c MFr Bld: 4.7 % — ABNORMAL LOW (ref 4.8–5.6)
Mean Plasma Glucose: 88 mg/dL

## 2021-05-14 LAB — TRIGLYCERIDES
Triglycerides: 114 mg/dL (ref <150)
Triglycerides: 114 mg/dL (ref <150)

## 2021-05-14 LAB — MAGNESIUM: Magnesium: 2.9 mg/dL — ABNORMAL HIGH (ref 1.7–2.4)

## 2021-05-14 MED ORDER — PNEUMOCOCCAL 20-VAL CONJ VACC 0.5 ML IM SUSY
0.5000 mL | PREFILLED_SYRINGE | INTRAMUSCULAR | Status: DC
  Filled 2021-05-14: qty 0.5

## 2021-05-14 MED ORDER — POTASSIUM CHLORIDE 20 MEQ PO PACK
40.0000 meq | PACK | ORAL | Status: DC
  Administered 2021-05-14: 40 meq
  Filled 2021-05-14: qty 2

## 2021-05-14 MED ORDER — POTASSIUM CHLORIDE 20 MEQ PO PACK
40.0000 meq | PACK | Freq: Once | ORAL | Status: AC
  Administered 2021-05-14: 40 meq
  Filled 2021-05-14: qty 2

## 2021-05-14 MED ORDER — PERFLUTREN LIPID MICROSPHERE
1.0000 mL | INTRAVENOUS | Status: AC | PRN
  Administered 2021-05-14: 3 mL via INTRAVENOUS
  Filled 2021-05-14: qty 10

## 2021-05-14 MED ORDER — MAGNESIUM SULFATE 2 GM/50ML IV SOLN: 2.0000 g | Freq: Once | INTRAVENOUS | Status: DC

## 2021-05-14 MED ORDER — CALCIUM GLUCONATE-NACL 1-0.675 GM/50ML-% IV SOLN
1.0000 g | Freq: Once | INTRAVENOUS | Status: AC
  Administered 2021-05-14: 1000 mg via INTRAVENOUS
  Filled 2021-05-14: qty 50

## 2021-05-14 MED ORDER — POTASSIUM CHLORIDE 10 MEQ/50ML IV SOLN
10.0000 meq | INTRAVENOUS | Status: AC
  Administered 2021-05-14 (×4): 10 meq via INTRAVENOUS
  Filled 2021-05-14 (×3): qty 50

## 2021-05-14 NOTE — Progress Notes (Signed)
LTM EEG hooked up and running - no initial skin breakdown - push button tested - neuro notified. [facility] monitoring.  

## 2021-05-14 NOTE — Progress Notes (Addendum)
eLink Physician-Brief Progress Note ?Patient Name: Beth Barnes ?DOB: 06-Sep-1995 ?MRN: [id number] ? ? ?Date of Service ? 05/14/2021  ?HPI/Events of Note ? Hypokalemia  Hypomagnesemia - K+ = 3.2, Mg++ = 1.6, Ca++ = 8.1 and Creatinine = 0.86. Mg++ already replaced.   ?eICU Interventions ? Plan: ?Will replace K+ and Ca++. ?Mg++ level STAT. ?ABG at 5 AM.  ? ? ? ?Intervention Category ?Major Interventions: Electrolyte abnormality - evaluation and management ? ?Beth Barnes ?05/14/2021, 12:27 AM ?

## 2021-05-14 NOTE — Progress Notes (Signed)
eLink Physician-Brief Progress Note ?Patient Name: Terrilyn Larrivee ?DOB: 1995/09/12 ?MRN: [id number] ? ? ?Date of Service ? 05/14/2021  ?HPI/Events of Note ? Troponin = 280 --> 1223 --> 3101 is setting of post cardiac arrest and CPR and cardioversion. No suspicion of acute coronary syndrome based on initial EKG. Cardiology already following the patient and cardiac echo has already been ordered.   ?eICU Interventions ? Continue current management. Await cardiac echo and further input from cardiology.   ? ? ? ?Intervention Category ?Major Interventions: Other: ? ?Charlcie Prisco Cornelia Copa ?05/14/2021, 2:16 AM ?

## 2021-05-14 NOTE — Progress Notes (Signed)
? ?Progress Note ? ?Patient Name: Beth Barnes ?Date of Encounter: 05/14/2021 ? ?Primary Cardiologist:   None ? ? ?Subjective  ? ?Intubated and sedated ? ?Inpatient Medications  ?  ?Scheduled Meds: ? acetaminophen  650 mg Oral Q4H  ? Or  ? acetaminophen (TYLENOL) oral liquid 160 mg/5 mL  650 mg Per Tube Q4H  ? Or  ? acetaminophen  650 mg Rectal Q4H  ? chlorhexidine gluconate (MEDLINE KIT)  15 mL Mouth Rinse BID  ? Chlorhexidine Gluconate Cloth  6 each Topical Daily  ? docusate  100 mg Per Tube BID  ? folic acid  1 mg Per Tube Daily  ? insulin aspart  0-9 Units Subcutaneous Q4H  ? mouth rinse  15 mL Mouth Rinse 10 times per day  ? pantoprazole (PROTONIX) IV  40 mg Intravenous QHS  ? polyethylene glycol  17 g Per Tube Daily  ? potassium chloride  40 mEq Per Tube Q4H  ? thiamine  100 mg Per Tube Daily  ? ?Continuous Infusions: ? sodium chloride Stopped (05/13/21 2310)  ? amiodarone 30 mg/hr (05/14/21 0700)  ? cefTRIAXone (ROCEPHIN)  IV Stopped (05/13/21 2030)  ? fentaNYL infusion INTRAVENOUS 75 mcg/hr (05/14/21 0700)  ? levETIRAcetam 400 mL/hr at 05/14/21 0700  ? magnesium sulfate Stopped (05/13/21 1855)  ? midazolam 0.5 mg/hr (05/14/21 0700)  ? norepinephrine (LEVOPHED) Adult infusion 15 mcg/min (05/14/21 0700)  ? propofol (DIPRIVAN) infusion 60 mcg/kg/min (05/14/21 0700)  ? ?PRN Meds: ?[START ON 05/15/2021] acetaminophen **OR** [START ON 05/15/2021] acetaminophen (TYLENOL) oral liquid 160 mg/5 mL **OR** [START ON 05/15/2021] acetaminophen, busPIRone **OR** busPIRone, docusate, fentaNYL, LORazepam, magnesium sulfate, ondansetron (ZOFRAN) IV, perflutren lipid microspheres (DEFINITY) IV suspension, polyethylene glycol  ? ?Vital Signs  ?  ?Vitals:  ? 05/14/21 0600 05/14/21 0700 05/14/21 0800 05/14/21 0807  ?BP: 107/79 95/70  109/61  ?Pulse:   77 77  ?Resp: $Remov'14 14  14  'lqYeJl$ ?Temp: (!) 97 ?F (36.1 ?C) 98.2 ?F (36.8 ?C) 98.4 ?F (36.9 ?C)   ?TempSrc: Bladder Bladder Bladder   ?SpO2:   100% 100%  ?Weight:      ?Height:       ? ? ?Intake/Output Summary (Last 24 hours) at 05/14/2021 0912 ?Last data filed at 05/14/2021 0700 ?Gross per 24 hour  ?Intake 1521.37 ml  ?Output 970 ml  ?Net 551.37 ml  ? ?Filed Weights  ? 05/13/21 2023 05/14/21 0038 05/14/21 0500  ?Weight: 65 kg 65 kg 65 kg  ? ? ?Telemetry  ?  ?NSR, prolonged QT - Personally Reviewed ? ?ECG  ?  ?NA - Personally Reviewed ? ?Physical Exam  ? ?GEN: No acute distress.   ?Neck: No  JVD ?Cardiac: RRR, no murmurs, rubs, or gallops.  ?Respiratory: Clear  to auscultation bilaterally. ?GI: Soft, nontender, non-distended  ?MS: No  edema; No deformity. ?Neuro:  Intubated, sedated ?Psych: Normal affect  ? ?Labs  ?  ?Chemistry ?Recent Labs  ?Lab 05/13/21 ?1740 05/14/21 ?3567 05/14/21 ?0443 05/14/21 ?0818  ?NA 137 131* 132*  132* 131*  ?K 3.2* 2.8* 2.4*  2.4* 3.8  ?CL 97*  --  97*  97*  --   ?CO2 18*  --  24  24  --   ?GLUCOSE 245*  --  148*  148*  --   ?BUN 11  --  11  11  --   ?CREATININE 0.86  --  0.77  0.77  --   ?CALCIUM 8.1*  --  8.5*  8.5*  --   ?GFRNONAA >60  --  >  60  >60  --   ?ANIONGAP 22*  --  11  11  --   ?  ? ?Hematology ?Recent Labs  ?Lab 05/13/21 ?1740 05/14/21 ?0340 05/14/21 ?0443 05/14/21 ?0818  ?WBC 15.8*  --  19.0*  19.0*  --   ?RBC 4.40  --  4.27  4.27  --   ?HGB 13.1 13.9 12.8  12.8 13.6  ?HCT 42.3 41.0 38.1  38.1 40.0  ?MCV 96.1  --  89.2  89.2  --   ?MCH 29.8  --  30.0  30.0  --   ?MCHC 31.0  --  33.6  33.6  --   ?RDW 20.7*  --  20.5*  20.5*  --   ?PLT 210  --  250  250  --   ? ? ?Cardiac EnzymesNo results for input(s): TROPONINI in the last 168 hours. No results for input(s): TROPIPOC in the last 168 hours.  ? ?BNPNo results for input(s): BNP, PROBNP in the last 168 hours.  ? ?DDimer No results for input(s): DDIMER in the last 168 hours.  ? ?Radiology  ?  ?DG Abd 1 View ? ?Result Date: 05/13/2021 ?CLINICAL DATA:  Nasogastric tube placement EXAM: ABDOMEN - 1 VIEW COMPARISON:  None. FINDINGS: Distal tip of nasogastric tube is seen in distal stomach.  IMPRESSION: Distal tip of nasogastric tube seen in distal stomach. Electronically Signed   By: Marijo Conception M.D.   On: 05/13/2021 18:12  ? ?CT HEAD WO CONTRAST (5MM) ? ?Result Date: 05/13/2021 ?CLINICAL DATA:  Acute neurological deficit.  Stroke suspected. EXAM: CT HEAD WITHOUT CONTRAST TECHNIQUE: Contiguous axial images were obtained from the base of the skull through the vertex without intravenous contrast. RADIATION DOSE REDUCTION: This exam was performed according to the departmental dose-optimization program which includes automated exposure control, adjustment of the mA and/or kV according to patient size and/or use of iterative reconstruction technique. COMPARISON:  None. FINDINGS: Brain: No evidence of acute infarction, hemorrhage, hydrocephalus, extra-axial collection or mass lesion/mass effect. Vascular: No hyperdense vessel or unexpected calcification. Skull: Normal. Negative for fracture or focal lesion. Sinuses/Orbits: Paranasal sinuses are clear. Right ocular prosthesis is suggested. Other: None. IMPRESSION: No acute intracranial abnormalities. Electronically Signed   By: Lucienne Capers M.D.   On: 05/13/2021 21:53  ? ?DG CHEST PORT 1 VIEW ? ?Result Date: 05/14/2021 ?CLINICAL DATA:  Cardiac arrest EXAM: PORTABLE CHEST 1 VIEW COMPARISON:  05/13/2021 FINDINGS: Endotracheal tube, nasogastric tube, are unchanged. Pulmonary insufflation is stable. Extensive bilateral upper lung zone airspace infiltrate appears stable. There is progressive infiltrate within the left lower lung zone. No pneumothorax or pleural effusion. Cardiac size within normal limits. No acute bone abnormality. IMPRESSION: Stable support tubes. Mild pulmonary hypoinflation, stable. Progressive extensive asymmetric airspace infiltrate in keeping with asymmetric pulmonary edema or infection. Electronically Signed   By: Fidela Salisbury M.D.   On: 05/14/2021 01:50  ? ?DG Chest Port 1 View ? ?Result Date: 05/13/2021 ?CLINICAL DATA:  Endotracheal  tube. EXAM: PORTABLE CHEST 1 VIEW COMPARISON:  Chest x-ray 11/30/2020. FINDINGS: Endotracheal tube tip is approximately 2.9 cm above the carina. Enteric tube extends below the diaphragm. Lines and tubes overlie the chest. There is dense airspace disease in the bilateral upper lobes, left greater than right. Costophrenic angles are clear. No pneumothorax or pleural effusion identified. Cardiomediastinal silhouette is within normal limits. Osseous structures are within normal limits. IMPRESSION: 1. Endotracheal tube tip 2.9 cm above the carina. 2. Bilateral upper lobe airspace disease, left greater than right.  Findings may be related to infection or hemorrhage. Correlate clinically. Electronically Signed   By: Ronney Asters M.D.   On: 05/13/2021 18:08  ? ?ECHOCARDIOGRAM COMPLETE ? ?Result Date: 05/14/2021 ?   ECHOCARDIOGRAM REPORT   Patient Name:   JOLICIA DELIRA Date of Exam: 05/14/2021 Medical Rec #:  [id number]     Height:       66.0 in Accession #:    [id number]    Weight:       143.3 lb Date of Birth:  May 15, 1995     BSA:          1.736 m? Patient Age:    25 years      BP:           95/70 mmHg Patient Gender: F             HR:           79 bpm. Exam Location:  Inpatient Procedure: 2D Echo, Cardiac Doppler, Color Doppler and Intracardiac            Opacification Agent                       STAT ECHO Reported to: Dr Dani Gobble Croitoru on 05/14/2021 8:25:00 AM. Indications:    Cardiac arrest  History:        Patient has no prior history of Echocardiogram examinations.                 Polysubstance abuse.  Sonographer:    Clayton Lefort RDCS (AE) Referring Phys: [id number] Candee Furbish  Sonographer Comments: Echo performed with patient supine and on artificial respirator. IMPRESSIONS  1. Left ventricular ejection fraction, by estimation, is <20%. The left ventricle has severely decreased function. The left ventricle demonstrates global hypokinesis. Left ventricular diastolic parameters are consistent with Grade II diastolic  dysfunction (pseudonormalization).  2. Right ventricular systolic function is moderately reduced. The right ventricular size is normal.  3. The mitral valve is normal in structure. No evidence of mitral valve regurgitation.  4. The aortic valve is tricus

## 2021-05-14 NOTE — Progress Notes (Signed)
Informed Elink for patient's temperature=38.9 C and critical lab Troponin=1.223 and Lactic acid=8.4. Applied ice pack on patient and MD ordered additional Tylenol via tube. MD also ordered artic sun pads. RN will apply apply upon return to CT stat. ?

## 2021-05-14 NOTE — Procedures (Signed)
Patient Name: Beth Barnes  ?MRN: [id number]  ?Epilepsy Attending: Lora Havens  ?Referring Physician/Provider: Candee Furbish, MD ?Date: 05/14/2021 ?Duration: 23.21 mins ? ?Patient history: 26 year old woman with hx of blindness, polysubstance abuse who was visiting boyfriend in [place] and found unresponsive, unknown downtime. Now with myoclonus. EEG to evaluate for seizure ?  ?Level of alertness:  comatose ?  ?AEDs during EEG study: propofol, versed, LEV ? ?Technical aspects: This EEG study was done with scalp electrodes positioned according to the 10-20 International system of electrode placement. Electrical activity was acquired at a sampling rate of 500Hz  and reviewed with a high frequency filter of 70Hz  and a low frequency filter of 1Hz . EEG data were recorded continuously and digitally stored.  ? ?Description: EEG showed continuous generalized polymorphic mixed frequencies with predominantly 3 to 7 Hz theta-delta slowing admixed with intermittent generalized 15 to 18 Hz beta activity.  Hyperventilation and photic stimulation were not performed.    ? ?ABNORMALITY ?- Continuous slow, generalized ? ?IMPRESSION: ?This study is suggestive of severe diffuse encephalopathy, nonspecific etiology.  No seizures or epileptiform discharges were seen throughout the recording. ? ?Lora Havens  ? ?

## 2021-05-14 NOTE — Progress Notes (Signed)
EEG called and left message telling them that pt has arrived to 2H23 and is ready for EEG monitoring whenever they are available ? ?Awaiting callback ?

## 2021-05-14 NOTE — Progress Notes (Signed)
EEG completed, results pending. 

## 2021-05-14 NOTE — Progress Notes (Signed)
?  Echocardiogram ?2D Echocardiogram has been performed. ? ?Gerda Diss ?05/14/2021, 8:30 AM ?

## 2021-05-14 NOTE — Progress Notes (Signed)
Pt arrived to 2H23 with EMS, team in room to accept patient and get patient settled ?

## 2021-05-14 NOTE — Progress Notes (Signed)
Pt on continous eeg monitoring and normothermia, family in the room have been updated by MDs, pt on her back for accurate reading. ?

## 2021-05-14 NOTE — Progress Notes (Signed)
Patient left the unit via [facility] and will be transported to [facility]. All patient's belongings were given to family- Mother & Sister who were at bedside.  ?

## 2021-05-14 NOTE — Progress Notes (Signed)
Patient visibly stop multiple seizure activities at around 19:20, highest HR at 180's and BP 190/120. Stabilized her vitals and continue to monitor the patient.  ?

## 2021-05-14 NOTE — Progress Notes (Signed)
MD notified pt's decreased urine output and increasing creatinine, no new orders ?

## 2021-05-14 NOTE — Progress Notes (Signed)
eLink Physician-Brief Progress Note ?Patient Name: Beth Barnes ?DOB: [birth year] ?MRN: [id number] ? ? ?Date of Service ? 05/14/2021  ?HPI/Events of Note ? Hypokalemia - K+ = 2.4 and Creatinine = 0.77.  ?eICU Interventions ? Plan: ?Will replace K+. ?Repeat BMP at 3 PM.   ? ? ? ?Intervention Category ?Major Interventions: Electrolyte abnormality - evaluation and management ? ?[name] ?05/14/2021, 6:36 AM ?

## 2021-05-14 NOTE — Progress Notes (Signed)
eLink Physician-Brief Progress Note ?Patient Name: Beth Barnes ?DOB: Jun 11, 1995 ?MRN: [id number] ? ? ?Date of Service ? 05/14/2021  ?HPI/Events of Note ? ABG on 100%/PRVC 18/TV 470/P 5 = 7.51/28.6/94/23.2.  ?eICU Interventions ? Plan: ?Decrease PRVC rate to 14. ?Increase PEEP to 8. ?Repeat ABG at 7:30 AM.  ? ? ? ?Intervention Category ?Major Interventions: Acid-Base disturbance - evaluation and management;Respiratory failure - evaluation and management ? ?Kaytie Ratcliffe Dennard Nip ?05/14/2021, 3:56 AM ?

## 2021-05-14 NOTE — Progress Notes (Signed)
Subjective: ?Sedated on propofol at a rate of 60, fentanyl at a rate of 75 and Versed at a rate of 0.5 ? ?Objective: ?Current vital signs: ?BP 95/70 (BP Location: Left Arm)   Pulse 74   Temp 98.2 ?F (36.8 ?C) (Bladder)   Resp 14   Ht 5\' 6"  (1.676 m)   Wt 65 kg   SpO2 100%   BMI 23.13 kg/m?  ?Vital signs in last 24 hours: ?Temp:  [94.5 ?F (34.7 ?C)-102.2 ?F (39 ?C)] 98.2 ?F (36.8 ?C) (04/05 0700) ?Pulse Rate:  [74-116] 74 (04/05 0245) ?Resp:  [14-39] 14 (04/05 0700) ?BP: (91-117)/(65-83) 95/70 (04/05 0700) ?SpO2:  [82 %-100 %] 100 % (04/05 0500) ?Arterial Line BP: (73-210)/(43-146) 110/65 (04/05 0700) ?FiO2 (%):  [80 %-100 %] 100 % (04/05 0324) ?Weight:  [65 kg] 65 kg (04/05 0500) ? ?Intake/Output from previous day: ?04/04 0701 - 04/05 0700 ?In: 1521.4 [I.V.:1237.6; NG/GT:50; IV Piggyback:233.7] ?Out: 970 [Urine:485; Emesis/NG output:485] ?Intake/Output this shift: ?No intake/output data recorded. ?Nutritional status:  ?Diet Order   ? ?       ?  Diet NPO time specified  Diet effective now       ?  ? ?  ?  ? ?  ? ?HEENT: [place]/AT. Right eye prosthesis noted ?Lungs: Intubated ?Ext: No edema ? ?Neurologic Exam: ?Ment: Sedated on propofol at a rate of 60, fentanyl at a rate of 75 and Versed at a rate of 0.5. No responses to any external stimuli except for slight left wrist flexion to sternal rub. Eyes are closed.  ?CN: Right eye prosthesis. Left eye with dense corneal opacity. Corneal reflex on the left is present but weak (sedated). Face flaccidly symmetric.  ?Motor/Sensory: Flaccid tone x 4. No movement to pinch x 4. Slight movement of left wrist to sternal rub.  ?Reflexes: Hypoactive in the context of sedation. Toes are mute ?Cerebellar/Gait: Unable to assess ? ?Lab Results: ?Results for orders placed or performed during the hospital encounter of 05/13/21 (from the past 48 hour(s))  ?Culture, blood (routine x 2)     Status: None (Preliminary result)  ? Collection Time: 05/13/21  5:28 PM  ? Specimen: BLOOD  ?Result  Value Ref Range  ? Specimen Description    ?  BLOOD A-LINE ?Performed at [facility], [place] [address], [place], [place] [address] ?  ? Special Requests IN PEDIATRIC BOTTLE Blood Culture adequate volume   ? Culture    ?  NO GROWTH < 12 HOURS ?Performed at [facility], [place] [address], [place], [place] [address] ?  ? Report Status PENDING   ?Culture, blood (routine x 2)     Status: None (Preliminary result)  ? Collection Time: 05/13/21  5:28 PM  ? Specimen: BLOOD  ?Result Value Ref Range  ? Specimen Description    ?  BLOOD A-LINE ?Performed at [facility], [place] [address], [place], [place] [address] ?  ? Special Requests IN PEDIATRIC BOTTLE Blood Culture adequate volume   ? Culture    ?  NO GROWTH < 12 HOURS ?Performed at [facility], [place] [address] [place], [place] [address] ?  ? Report Status PENDING   ?Type and screen [place]     Status: None  ? Collection Time: 05/13/21  5:37 PM  ?Result Value Ref Range  ? ABO/RH(D) O POS   ? Antibody Screen NEG   ? Sample Expiration    ?  05/16/2021,2359 ?Performed  at [facility], [place] [address], [place], [place] [address] ?  ?HIV Antibody (routine testing w rflx)     Status: None  ? Collection Time: 05/13/21  5:40 PM  ?Result Value Ref Range  ? HIV Screen 4th Generation wRfx Non Reactive Non Reactive  ?  Comment: Performed at [facility], [place] [address] [place], [place] [address]  ?CBC     Status: Abnormal  ? Collection Time: 05/13/21  5:40 PM  ?Result Value Ref Range  ? WBC 15.8 (H) 4.0 - 10.5 K/uL  ? RBC 4.40 3.87 - 5.11 MIL/uL  ? Hemoglobin 13.1 12.0 - 15.0 g/dL  ? HCT 42.3 36.0 - 46.0 %  ? MCV 96.1 80.0 - 100.0 fL  ? MCH 29.8 26.0 - 34.0 pg  ? MCHC 31.0 30.0 - 36.0 g/dL  ? RDW 20.7 (H) 11.5 - 15.5 %  ? Platelets 210 150 - 400 K/uL  ? nRBC 0.3 (H) 0.0 - 0.2 %  ?  Comment: Performed at [facility], [place] [address] [place], [place] [address]  ?Basic  metabolic panel     Status: Abnormal  ? Collection Time: 05/13/21  5:40 PM  ?Result Value Ref Range  ? Sodium 137 135 - 145 mmol/L  ? Potassium 3.2 (L) 3.5 - 5.1 mmol/L  ? Chloride 97 (L) 98 - 111 mmol/L  ? CO2 18 (L) 22 - 32 mmol/L  ? Glucose, Bld 245 (H) 70 - 99 mg/dL  ?  Comment: Glucose reference range applies only to samples taken after fasting for at least 8 hours.  ? BUN 11 6 - 20 mg/dL  ? Creatinine, Ser 0.86 0.44 - 1.00 mg/dL  ? Calcium 8.1 (L) 8.9 - 10.3 mg/dL  ? GFR, Estimated >60 >60 mL/min  ?  Comment: (NOTE) ?Calculated using the CKD-EPI Creatinine Equation (2021) ?  ? Anion gap 22 (H) 5 - 15  ?  Comment: Electrolytes repeated to confirm. ?Performed at [facility], [place] [address] [place], [place] [address] ?  ?Magnesium     Status: Abnormal  ? Collection Time: 05/13/21  5:40 PM  ?Result Value Ref Range  ? Magnesium 1.6 (L) 1.7 - 2.4 mg/dL  ?  Comment: Performed at [facility], [place] [address], [place], [place] [address]  ?Phosphorus     Status: Abnormal  ? Collection Time: 05/13/21  5:40 PM  ?Result Value Ref Range  ? Phosphorus 5.1 (H) 2.5 - 4.6 mg/dL  ?  Comment: Performed at [facility], [place] [address] [place], [place] [address]  ?Lactic acid, plasma     Status: Abnormal  ? Collection Time: 05/13/21  5:40 PM  ?Result Value Ref Range  ? Lactic Acid, Venous >9.0 (HH) 0.5 - 1.9 mmol/L  ?  Comment: CRITICAL RESULT CALLED TO, READ BACK BY AND VERIFIED WITH: ?A.WATSON, RN AT 1905 ON 04.04.23 BY N.THOMPSON ?Performed at [facility], [place] [address] [place], [place] [address] ?  ?Troponin I (High Sensitivity)     Status: Abnormal  ? Collection Time: 05/13/21  5:40 PM  ?Result Value Ref Range  ? Troponin I (High Sensitivity) 280 (HH) <18 ng/L  ?  Comment: CRITICAL RESULT CALLED TO, READ BACK BY AND VERIFIED WITH: ?A.WATSON, RN AT 1905 ON 04.04.23 BY N.THOMPSON ?(NOTE) ?Elevated high sensitivity troponin I (hsTnI) values and  significant  ?changes across serial measurements may suggest ACS but many other  ?chronic and acute conditions  are known to elevate hsTnI results.  ?Refer to the Links section for chest pain algorithms and additional  ?guidance. ?Performed at [facility], [place] [address] ?[place], [place] [address] ?  ?Hemoglobin A1c     Status: Abnormal  ? Collection Time: 05/13/21  5:40 PM  ?Result Value Ref Range  ? Hgb A1c MFr Bld 4.7 (L) 4.8 - 5.6 %  ?  Comment: (NOTE) ?        Prediabetes: 5.7 - 6.4 ?        Diabetes: >6.4 ?        Glycemic control for adults with diabetes: <7.0 ?  ? Mean Plasma Glucose 88 mg/dL  ?  Comment: (NOTE) ?Performed At: [facility] ?[address] [place], [place] [id number] ?Rush Farmer MD [id number] ?  ?Arterial Blood Gas     Status: Abnormal  ? Collection Time: 05/13/21  5:44 PM  ?Result Value Ref Range  ? FIO2 100.00 %  ? Delivery systems VENTILATOR   ? Mode PRESSURE REGULATED VOLUME CONTROL   ? MECHVT 470 mL  ? RATE 18.0 resp/min  ? PEEP 5.0 cm H20  ? pH, Arterial 7.38 7.35 - 7.45  ? pCO2 arterial 36 32 - 48 mmHg  ? pO2, Arterial 93 83 - 108 mmHg  ? Bicarbonate 21.3 20.0 - 28.0 mmol/L  ? Acid-base deficit 3.3 (H) 0.0 - 2.0 mmol/L  ? O2 Saturation 98.6 %  ? Patient temperature 37.0   ? Collection site A-LINE DRAW   ? Drawn by [id number]   ? [place] test (pass/fail) PASS PASS  ?  Comment: Performed at [facility], [place] [address], [place], [place] [address]  ?Glucose, capillary     Status: Abnormal  ? Collection Time: 05/13/21  6:33 PM  ?Result Value Ref Range  ? Glucose-Capillary 254 (H) 70 - 99 mg/dL  ?  Comment: Glucose reference range applies only to samples taken after fasting for at least 8 hours.  ?Troponin I (High Sensitivity)     Status: Abnormal  ? Collection Time: 05/13/21  6:49 PM  ?Result Value Ref Range  ? Troponin I (High Sensitivity) 1,223 (HH) <18 ng/L  ?  Comment: CRITICAL VALUE NOTED.  VALUE IS CONSISTENT WITH PREVIOUSLY REPORTED  AND CALLED VALUE. ?(NOTE) ?Elevated high sensitivity troponin I (hsTnI) values and significant  ?changes across serial measurements may suggest ACS but many other  ?chronic and acute conditions are known to elevate hs

## 2021-05-14 NOTE — Progress Notes (Signed)
? ?NAME:  Beth Barnes, MRN:  [id number], DOB:  1995-11-15, LOS: 1 ?ADMISSION DATE:  05/13/2021, CONSULTATION DATE:  05/13/21 ?REFERRING MD:  N/A, CHIEF COMPLAINT:  Cardiac arrest  ? ?History of Present Illness:  ?26 year old woman with hx of glaucoma and blindness, polysubstance abuse who was visiting boyfriend in [place] and found unresponsive, unknown downtime.  CPR begun immediately (see separate note).  CRNA intubated.  Initial rhythm vfib.  ROSC obtained and patient direct admitted to ICU.  History obtained from boyfriend. She left briefly and then came back.  History of cocaine/etoh/MJ abuse.  He denies she has any IVDA or opiate use.  All her family is in [place]. ? ? ?Family arrived and state that patient has a history of heavy binge-drinking, had recently been trying to cut back recently, along with recent ER visits for generally not feeling well and had been told her potassium was low ? ?Pertinent  Medical History  ? ?Past Medical History:  ?Diagnosis Date  ? Blind   ? Congenital glaucoma of both eyes   ? Hypokalemia   ? ? ?Significant Hospital Events: ?Including procedures, antibiotic start and stop dates in addition to other pertinent events   ?4/4 cardiac arrest ~10 min downtime ?4/5 Transferred to [facility] for LTM EEG, intubated on Levophed ? ? ?Studies: ? ? ?4/5 EEG>This study is suggestive of severe diffuse encephalopathy, nonspecific etiology.  No seizures or epileptiform discharges were seen throughout the recording. ? ? ?Echo 4/5> ? 1. Left ventricular ejection fraction, by estimation, is <20%. The left  ?ventricle has severely decreased function. The left ventricle demonstrates  ?global hypokinesis. Left ventricular diastolic parameters are consistent  ?with Grade II diastolic  ?dysfunction (pseudonormalization).  ? 2. Right ventricular systolic function is moderately reduced. The right  ?ventricular size is normal.  ? 3. The mitral valve is normal in structure. No evidence of mitral valve  ?regurgitation.  ? 4.  The aortic valve is tricuspid. Aortic valve regurgitation is not  ?visualized. No aortic stenosis is present.  ? ? ?4/4 CTH>no acute findings ? ? ? ?Interim History / Subjective:  ?Transferred from  [facility], has severe cardiomyopathy on echo ?Getting EEG ?Remains intubated and sedated ? ?Objective   ?Blood pressure 109/61, pulse 77, temperature 98.4 ?F (36.9 ?C), temperature source Bladder, resp. rate 14, height 5\' 6"  (1.676 m), weight 65 kg, SpO2 100 %. ?   ?Vent Mode: PRVC ?FiO2 (%):  [70 %-100 %] 70 % ?Set Rate:  [14 bmp-18 bmp] 14 bmp ?Vt Set:  [470 mL] 470 mL ?PEEP:  [5 cmH20-8 cmH20] 8 cmH20 ?Plateau Pressure:  [18 cmH20-20 cmH20] 19 cmH20  ? ?Intake/Output Summary (Last 24 hours) at 05/14/2021 0906 ?Last data filed at 05/14/2021 0700 ?Gross per 24 hour  ?Intake 1521.37 ml  ?Output 970 ml  ?Net 551.37 ml  ? ?Filed Weights  ? 05/13/21 2023 05/14/21 0038 05/14/21 0500  ?Weight: 65 kg 65 kg 65 kg  ? ? ?General:  critically ill-appearing F intubated and sedated ?HEENT: MM pink/moist, R eye prosthesis and L cornea opacified ?Neuro: examined on propofol and fentanyl, RASS -4  ?CV: s1s2 rrr, no m/r/g ?PULM:  mechanical breath sounds bilaterally ?GI: soft, bsx4 active  ?Extremities: warm/dry, no edema  ?Skin: no rashes or lesions ? ? ? ? ?Resolved Hospital Problem list   ?N/A ? ?Assessment & Plan:  ? ?Witnessed in Hospital Vfib cardiac arrest likely secondary to severe cardiomyopathy, prolonged Qtc and hypokalemia  ?Subsequent acute hypoxic respiratory failure and  cardiogenic shock  ?Immediate CPR, approximately 10 minutes down time ?-Echo with EF <20% ?-continue supportive care with normothermia, electrolyte repletion, ventilator support and pressors  ?-appreciate cardiology recs, if K normalized today then consider stopping amiodarone in the setting of prolonged Qtc ?--Maintain full vent support with SAT/SBT as tolerated ?-titrate Vent setting to maintain SpO2 greater than or equal to 90%. ?-HOB elevated 30  degrees. ?-Plateau pressures less than 30 cm H20.  ?-Follow chest x-ray, ABG prn.   ?-Bronchial hygiene and RT/bronchodilator protocol. ?-repeat EKG, avoid Qtc prolonging medications ? ? ? ? ?Hx of ETOH abuse ?Reported cocaine and marijuana abuse ?Post arrest encephalopathy ?CT head unremarkable ?-initial EEG without seizure ?-seen by Neurology, recommend MRI as soon as possible, continue Versed as patient at risk for withdrawal  ?-Keppra 1500bid ?-thiamine and folate  ? ? ?Best Practice (right click and "Reselect all SmartList Selections" daily)  ? ?Diet/type: NPO ?DVT prophylaxis: SCD ?GI prophylaxis: PPI ?Lines: Central line ?Foley:  Yes, and it is still needed ?Code Status:  full code ?Last date of multidisciplinary goals of care discussion [Pt's mother updated at the bedside with guarded prognosis, continue full code 4/5] ? ?Labs   ?CBC: ?Recent Labs  ?Lab 05/13/21 ?1740 05/14/21 ?4656 05/14/21 ?0443 05/14/21 ?0818  ?WBC 15.8*  --  19.0*  19.0*  --   ?HGB 13.1 13.9 12.8  12.8 13.6  ?HCT 42.3 41.0 38.1  38.1 40.0  ?MCV 96.1  --  89.2  89.2  --   ?PLT 210  --  250  250  --   ? ? ?Basic Metabolic Panel: ?Recent Labs  ?Lab 05/13/21 ?1740 05/14/21 ?0106 05/14/21 ?8127 05/14/21 ?0443 05/14/21 ?0818  ?NA 137  --  131* 132*  132* 131*  ?K 3.2*  --  2.8* 2.4*  2.4* 3.8  ?CL 97*  --   --  97*  97*  --   ?CO2 18*  --   --  24  24  --   ?GLUCOSE 245*  --   --  148*  148*  --   ?BUN 11  --   --  11  11  --   ?CREATININE 0.86  --   --  0.77  0.77  --   ?CALCIUM 8.1*  --   --  8.5*  8.5*  --   ?MG 1.6* 2.9*  --   --   --   ?PHOS 5.1*  --   --   --   --   ? ?GFR: ?Estimated Creatinine Clearance: 101.5 mL/min (by C-G formula based on SCr of 0.77 mg/dL). ?Recent Labs  ?Lab 05/13/21 ?1740 05/13/21 ?1910 05/14/21 ?0443  ?WBC 15.8*  --  19.0*  19.0*  ?LATICACIDVEN >9.0* 8.4*  --   ? ? ?Liver Function Tests: ?No results for input(s): AST, ALT, ALKPHOS, BILITOT, PROT, ALBUMIN in the last 168 hours. ?No results for  input(s): LIPASE, AMYLASE in the last 168 hours. ?No results for input(s): AMMONIA in the last 168 hours. ? ?ABG ?   ?Component Value Date/Time  ? PHART 7.484 (H) 05/14/2021 0818  ? PCO2ART 34.4 05/14/2021 0818  ? PO2ART 219 (H) 05/14/2021 0818  ? HCO3 26.0 05/14/2021 0818  ? TCO2 27 05/14/2021 0818  ? ACIDBASEDEF 3.3 (H) 05/13/2021 1744  ? O2SAT 100 05/14/2021 0818  ?  ? ?Coagulation Profile: ?No results for input(s): INR, PROTIME in the last 168 hours. ? ?Cardiac Enzymes: ?No results for input(s): CKTOTAL, CKMB, CKMBINDEX, TROPONINI in the last 168  hours. ? ?HbA1C: ?Hgb A1c MFr Bld  ?Date/Time Value Ref Range Status  ?05/13/2021 05:40 PM 4.7 (L) 4.8 - 5.6 % Final  ?  Comment:  ?  (NOTE) ?        Prediabetes: 5.7 - 6.4 ?        Diabetes: >6.4 ?        Glycemic control for adults with diabetes: <7.0 ?  ? ? ?CBG: ?Recent Labs  ?Lab 05/13/21 ?1657 05/13/21 ?2214 05/14/21 ?0219 05/14/21 ?0409 05/14/21 ?9038  ?GLUCAP 254* 108* 212* 118* 113*  ? ? ?Review of Systems:   ?comatose ? ?Past Medical History:  ?She,  has a past medical history of Blind, Congenital glaucoma of both eyes, and Hypokalemia.  ? ?Surgical History:  ? ?Past Surgical History:  ?Procedure Laterality Date  ? EYE SURGERY    ?  ? ?Social History:  ? reports that she has never smoked. She has never used smokeless tobacco. She reports current alcohol use of about 3.0 standard drinks per week. She reports that she does not currently use drugs.  ? ?Family History:  ?Her family history includes Healthy in her father and mother.  ? ?Allergies ?Allergies  ?Allergen Reactions  ? Cetirizine & Related Hives  ?  ? ?Home Medications  ?Prior to Admission medications   ?Medication Sig Start Date End Date Taking? Authorizing Provider  ?clonazePAM (KLONOPIN) 0.5 MG tablet Take 0.5 mg by mouth daily as needed. 11/22/19   [provider]  ?famotidine (PEPCID) 20 MG tablet Take 1 tablet (20 mg total) by mouth 2 (two) times daily. ?Patient not taking: No sig  reported 02/01/20   Pricilla Loveless, MD  ?ibuprofen (ADVIL) 200 MG tablet Take 200-400 mg by mouth every 6 (six) hours as needed for cramping or mild pain.    [provider]  ?meclizine (ANTIVERT) 25 MG tablet Take 1 tablet (25 mg tota

## 2021-05-15 ENCOUNTER — Other Ambulatory Visit: Payer: Self-pay

## 2021-05-15 DIAGNOSIS — I469 Cardiac arrest, cause unspecified: Secondary | ICD-10-CM | POA: Diagnosis not present

## 2021-05-15 DIAGNOSIS — G931 Anoxic brain damage, not elsewhere classified: Secondary | ICD-10-CM | POA: Diagnosis not present

## 2021-05-15 LAB — CBC
HCT: 37.8 % (ref 36.0–46.0)
Hemoglobin: 12.7 g/dL (ref 12.0–15.0)
MCH: 30.1 pg (ref 26.0–34.0)
MCHC: 33.6 g/dL (ref 30.0–36.0)
MCV: 89.6 fL (ref 80.0–100.0)
Platelets: 187 10*3/uL (ref 150–400)
RBC: 4.22 MIL/uL (ref 3.87–5.11)
RDW: 20.4 % — ABNORMAL HIGH (ref 11.5–15.5)
WBC: 10.4 10*3/uL (ref 4.0–10.5)
nRBC: 0.4 % — ABNORMAL HIGH (ref 0.0–0.2)

## 2021-05-15 LAB — GLUCOSE, CAPILLARY
Glucose-Capillary: 103 mg/dL — ABNORMAL HIGH (ref 70–99)
Glucose-Capillary: 110 mg/dL — ABNORMAL HIGH (ref 70–99)
Glucose-Capillary: 131 mg/dL — ABNORMAL HIGH (ref 70–99)
Glucose-Capillary: 143 mg/dL — ABNORMAL HIGH (ref 70–99)
Glucose-Capillary: 54 mg/dL — ABNORMAL LOW (ref 70–99)
Glucose-Capillary: 55 mg/dL — ABNORMAL LOW (ref 70–99)
Glucose-Capillary: 64 mg/dL — ABNORMAL LOW (ref 70–99)
Glucose-Capillary: 79 mg/dL (ref 70–99)
Glucose-Capillary: 81 mg/dL (ref 70–99)
Glucose-Capillary: 83 mg/dL (ref 70–99)

## 2021-05-15 LAB — BASIC METABOLIC PANEL
Anion gap: 8 (ref 5–15)
BUN: 6 mg/dL (ref 6–20)
CO2: 21 mmol/L — ABNORMAL LOW (ref 22–32)
Calcium: 7.8 mg/dL — ABNORMAL LOW (ref 8.9–10.3)
Chloride: 106 mmol/L (ref 98–111)
Creatinine, Ser: 0.68 mg/dL (ref 0.44–1.00)
GFR, Estimated: >60 mL/min (ref >60)
Glucose, Bld: 86 mg/dL (ref 70–99)
Potassium: 2.8 mmol/L — ABNORMAL LOW (ref 3.5–5.1)
Sodium: 135 mmol/L (ref 135–145)

## 2021-05-15 LAB — PHOSPHORUS
Phosphorus: 3 mg/dL (ref 2.5–4.6)
Phosphorus: 2.3 mg/dL — ABNORMAL LOW (ref 2.5–4.6)
Phosphorus: 3 mg/dL (ref 2.5–4.6)

## 2021-05-15 LAB — MAGNESIUM
Magnesium: 2.2 mg/dL (ref 1.7–2.4)
Magnesium: 2.1 mg/dL (ref 1.7–2.4)
Magnesium: 2.1 mg/dL (ref 1.7–2.4)

## 2021-05-15 MED ORDER — DEXTROSE 50 % IV SOLN
INTRAVENOUS | Status: AC
  Administered 2021-05-15: 12.5 g via INTRAVENOUS
  Filled 2021-05-15: qty 50

## 2021-05-15 MED ORDER — POTASSIUM CHLORIDE 10 MEQ/50ML IV SOLN
10.0000 meq | INTRAVENOUS | Status: AC
  Administered 2021-05-15 (×4): 10 meq via INTRAVENOUS
  Filled 2021-05-15 (×4): qty 50

## 2021-05-15 MED ORDER — PROSOURCE TF PO LIQD
45.0000 mL | Freq: Two times a day (BID) | ORAL | Status: DC
  Administered 2021-05-15: 45 mL
  Filled 2021-05-15: qty 45

## 2021-05-15 MED ORDER — POTASSIUM CHLORIDE 20 MEQ PO PACK
40.0000 meq | PACK | Freq: Once | ORAL | Status: AC
  Administered 2021-05-15: 40 meq
  Filled 2021-05-15: qty 2

## 2021-05-15 MED ORDER — DEXTROSE 50 % IV SOLN: 12.5000 g | Freq: Once | INTRAVENOUS | Status: AC

## 2021-05-15 MED ORDER — THIAMINE HCL 100 MG PO TABS
500.0000 mg | ORAL_TABLET | Freq: Every day | ORAL | Status: DC
  Administered 2021-05-15 – 2021-05-18 (×4): 500 mg
  Filled 2021-05-15 (×4): qty 5

## 2021-05-15 MED ORDER — VITAL HIGH PROTEIN PO LIQD
1000.0000 mL | ORAL | Status: DC
  Administered 2021-05-15: 1000 mL

## 2021-05-15 MED ORDER — DEXTROSE 50 % IV SOLN
12.5000 g | INTRAVENOUS | Status: AC
  Administered 2021-05-15: 12.5 g via INTRAVENOUS
  Filled 2021-05-15: qty 50

## 2021-05-15 MED ORDER — DEXMEDETOMIDINE HCL IN NACL 400 MCG/100ML IV SOLN
0.4000 ug/kg/h | INTRAVENOUS | Status: DC
  Administered 2021-05-15: 0.4 ug/kg/h via INTRAVENOUS
  Administered 2021-05-15: 0.5 ug/kg/h via INTRAVENOUS
  Filled 2021-05-15 (×2): qty 100

## 2021-05-15 NOTE — Progress Notes (Signed)
Hypoglycemic Event ? ?CBG: 64 ? ?Treatment: D50 25 mL (12.5 gm) ? ?Symptoms: None ? ?Follow-up CBG: Time:2047 CBG Result:131 ? ? ?Possible Reasons for Event: Inadequate meal intake and Other: NPO without nutrition source ?Comments/MD notified:.. ? ? ? ?Junious Silk ? ? ?

## 2021-05-15 NOTE — Progress Notes (Signed)
[facility] ADULT ICU REPLACEMENT PROTOCOL ? ? ?The patient does apply for the [facility] Adult ICU Electrolyte Replacment Protocol based on the criteria listed below:  ? ?1.Exclusion criteria: TCTS patients, ECMO patients, and Dialysis patients ?2. Is GFR >/= 30 ml/min? Yes.    ?Patient's GFR today is >60 ?3. Is SCr </= 2? Yes.   ?Patient's SCr is 0.68 mg/dL ?4. Did SCr increase >/= 0.5 in 24 hours? No. ?5.Pt's weight >40kg  Yes.   ?6. Abnormal electrolyte(s):   K 2.8  ?7. Electrolytes replaced per protocol ?8.  Call MD STAT for K+ </= 2.5, Phos </= 1, or Mag </= 1 ?Physician:  S. Sommer ? ?Beth Barnes 05/15/2021 4:56 AM ? ?

## 2021-05-15 NOTE — Progress Notes (Signed)
Inpatient Diabetes Program Recommendations ? ?AACE/ADA: New Consensus Statement on Inpatient Glycemic Control (2015) ? ?Target Ranges:  Prepandial:   less than 140 mg/dL ?     Peak postprandial:   less than 180 mg/dL (1-2 hours) ?     Critically ill patients:  140 - 180 mg/dL  ? ?Lab Results  ?Component Value Date  ? GLUCAP 143 (H) 05/15/2021  ? HGBA1C 4.7 (L) 05/13/2021  ? ? ?Review of Glycemic Control ? ? Latest Reference Range & Units 05/15/21 00:01 05/15/21 05:59 05/15/21 07:51 05/15/21 07:53 05/15/21 08:30  ?Glucose-Capillary 70 - 99 mg/dL 976 (H) 81 55 (L) 54 (L) 143 (H)  ?(H): Data is abnormally high ?(L): Data is abnormally low ? ?Inpatient Diabetes Program Recommendations:   ?Please consider: ?-Decrease in Novolog correction to 0-6 units q 4 hrs. ? ?Thank you, ?Billy Fischer Gabby Rackers, RN, MSN, CDE  ?Diabetes Coordinator ?Inpatient Glycemic Control Team ?Team Pager [phone] (8am-5pm) ?05/15/2021 11:00 AM ? ? ? ? ?

## 2021-05-15 NOTE — Progress Notes (Signed)
? ?NAME:  Beth Barnes, MRN:  [id number], DOB:  Jul 20, 1995, LOS: 2 ?ADMISSION DATE:  05/13/2021, CONSULTATION DATE:  05/13/21 ?REFERRING MD:  N/A, CHIEF COMPLAINT:  Cardiac arrest  ? ?History of Present Illness:  ?26 year old woman with hx of glaucoma and blindness, polysubstance abuse who was visiting boyfriend in [place] and found unresponsive, unknown downtime.  CPR begun immediately (see separate note).  CRNA intubated.  Initial rhythm vfib.  ROSC obtained and patient direct admitted to ICU.  History obtained from boyfriend. She left briefly and then came back.  History of cocaine/etoh/MJ abuse.  He denies she has any IVDA or opiate use.  All her family is in [place]. ? ? ?Family arrived and state that patient has a history of heavy binge-drinking, had recently been trying to cut back recently, along with recent ER visits for generally not feeling well and had been told her potassium was low ? ?Pertinent  Medical History  ? ?Past Medical History:  ?Diagnosis Date  ? Blind   ? Congenital glaucoma of both eyes   ? Hypokalemia   ? ? ?Significant Hospital Events: ?Including procedures, antibiotic start and stop dates in addition to other pertinent events   ?4/4 cardiac arrest ~10 min downtime ?4/5 Transferred to [facility] for LTM EEG, intubated on Levophed ?4/5 EEG>This study is suggestive of severe diffuse encephalopathy, nonspecific etiology.  No seizures or epileptiform discharges were seen throughout the recording. ?Echo 4/5>1. Left ventricular ejection fraction, by estimation, is <20%. The left  ?ventricle has severely decreased function. The left ventricle demonstrates  ?global hypokinesis. Left ventricular diastolic parameters are consistent  ?with Grade II diastolic  ?dysfunction (pseudonormalization).  ? 2. Right ventricular systolic function is moderately reduced. The right  ?ventricular size is normal.  ?4/4 CTH>no acute findings ?4/6 more awake today -  ? ? ?Interim History / Subjective:  ?No further myoclonus. Moving  spontaneously. On minimal NE support.  ? ?Objective   ?Blood pressure 123/77, pulse 94, temperature 98.4 ?F (36.9 ?C), resp. rate 14, height 5\' 6"  (1.676 m), weight 65 kg, SpO2 100 %. ?   ?Vent Mode: PRVC ?FiO2 (%):  [40 %-50 %] 40 % ?Set Rate:  [14 bmp] 14 bmp ?Vt Set:  [470 mL] 470 mL ?PEEP:  [8 cmH20] 8 cmH20 ?Plateau Pressure:  [18 cmH20-19 cmH20] 18 cmH20  ? ?Intake/Output Summary (Last 24 hours) at 05/15/2021 0846 ?Last data filed at 05/15/2021 0800 ?Gross per 24 hour  ?Intake 2330.51 ml  ?Output 1480 ml  ?Net 850.51 ml  ? ? ?Filed Weights  ? 05/13/21 2023 05/14/21 0038 05/14/21 0500  ?Weight: 65 kg 65 kg 65 kg  ? ? ?General:  critically ill-appearing F intubated and sedated ?HEENT: MM pink/moist, R eye prosthesis and L cornea opacified ?Neuro: examined on propofol and fentanyl, moves spontaneously and semi-purposefully. Mouthed words to pain. No following commands. ?CV: s1s2 rrr, no m/r/g ?PULM:  chest clear bilaterally.  ?GI: soft, bsx4 active  ?Extremities: warm/dry, no edema  ?Skin: no rashes or lesions ? ?POC echo shows mild improvement in EF  ? ?Ancillary tests personally reviewed:  ?CT head 4/4 normal. ?Hypokalemia.  ? ?Assessment & Plan:  ? ?Witnessed in Hospital Vfib cardiac arrest likely secondary to severe cardiomyopathy, prolonged Qtc and hypokalemia  ?Subsequent acute hypoxic respiratory failure and cardiogenic shock  ?Hx of ETOH abuse ?Reported cocaine and marijuana abuse ?Post arrest encephalopathy ? ?Plan:  ? ?- young age and relatively brief arrest are in her favor. She is already making  progress.   ?- Wean sedation to off and monitor for recurrent myoclonus.  ?- Continue to wean NE to off and initiate GDHFT, starting with betablocker.  ?- High dose thiamine as B1 deficiency might be contributing factor.  ?- Spoke to family and updated them of plan and possibility of slow recovery  ? ?Best Practice (right click and "Reselect all SmartList Selections" daily)  ? ?Diet/type: NPO - start tube  feeds ?DVT prophylaxis: SCD ?GI prophylaxis: PPI ?Lines: Central line ?Foley:  Yes, and it is still needed ?Code Status:  full code ?Last date of multidisciplinary goals of care discussion [Pt's mother updated at the bedside with guarded prognosis, continue full code 4/5] ? ?CRITICAL CARE ?Performed by: Lynnell Catalan ? ? ?Total critical care time: 50 minutes ? ?Critical care time was exclusive of separately billable procedures and treating other patients. ? ?Critical care was necessary to treat or prevent imminent or life-threatening deterioration. ? ?Critical care was time spent personally by me on the following activities: development of treatment plan with patient and/or surrogate as well as nursing, discussions with consultants, evaluation of patient's response to treatment, examination of patient, obtaining history from patient or surrogate, ordering and performing treatments and interventions, ordering and review of laboratory studies, ordering and review of radiographic studies, pulse oximetry, re-evaluation of patient's condition and participation in multidisciplinary rounds. ? ?Lynnell Catalan, MD FRCPC ?ICU Physician ?[facility]  ?Pager: [phone] ?Mobile: [phone] ?After hours: [phone].  ? ? ?

## 2021-05-15 NOTE — Procedures (Addendum)
Patient Name: Beth Barnes  ?MRN: [id number]  ?Epilepsy Attending: Charlsie Quest  ?Referring Physician/Provider: Lorin Glass, MD ?Duration: 05/14/2021 0930 to 05/15/2021 0930 ?  ?Patient history: 27 year old woman with hx of blindness, polysubstance abuse who was visiting boyfriend in [place] and found unresponsive, unknown downtime. Now with myoclonus. EEG to evaluate for seizure ?  ?Level of alertness:  comatose ?  ?AEDs during EEG study: propofol, versed, LEV ?  ?Technical aspects: This EEG study was done with scalp electrodes positioned according to the 10-20 International system of electrode placement. Electrical activity was acquired at a sampling rate of 500Hz  and reviewed with a high frequency filter of 70Hz  and a low frequency filter of 1Hz . EEG data were recorded continuously and digitally stored.  ?  ?Description: At the beginning of study, EEG showed continuous generalized polymorphic mixed frequencies with predominantly 3 to 7 Hz theta-delta slowing admixed with intermittent generalized 15 to 18 Hz beta activity. Gradually, EEG improved and showed predominantly 8-9hz  generalized alpha activity. Hyperventilation and photic stimulation were not performed.    ?  ?ABNORMALITY ?- Continuous slow, generalized ?  ?IMPRESSION: ?This study was initially suggestive of severe diffuse encephalopathy, nonspecific etiology. Gradually, EEG improved and was suggestive of mild to moderate diffuse encephalopathy, likely due to sedation.  No seizures or epileptiform discharges were seen throughout the recording. ?  ?  ?

## 2021-05-15 NOTE — Procedures (Signed)
Patient Name: Beth Barnes  ?MRN: [id number]  ?Epilepsy Attending: Charlsie Quest  ?Referring Physician/Provider: Lorin Glass, MD ?Duration: 05/15/2021 0930 to 05/15/2021 1320 ?  ?Patient history: 25 year old woman with hx of blindness, polysubstance abuse who was visiting boyfriend in [place] and found unresponsive, unknown downtime. Now with myoclonus. EEG to evaluate for seizure ?  ?Level of alertness:  awake, asleep ?  ?AEDs during EEG study: LEV ?  ?Technical aspects: This EEG study was done with scalp electrodes positioned according to the 10-20 International system of electrode placement. Electrical activity was acquired at a sampling rate of 500Hz  and reviewed with a high frequency filter of 70Hz  and a low frequency filter of 1Hz . EEG data were recorded continuously and digitally stored.  ?  ?Description: The posterior dominant rhythm consists of 8-9 Hz activity of moderate voltage (25-35 uV) seen predominantly in posterior head regions, symmetric and reactive to eye opening and eye closing. Sleep was characterized by vertex waves, sleep spindles (12 to 14 Hz), maximal frontocentral region. Hyperventilation and photic stimulation were not performed.    ?  ?IMPRESSION: ?This study is within normal limits.  No seizures or epileptiform discharges were seen throughout the recording. ?  ? ? ?

## 2021-05-15 NOTE — Progress Notes (Signed)
Discontinued cEEG study.  Notified [facility] monitoring.  No skin breakdown observed. 

## 2021-05-15 NOTE — Procedures (Signed)
Extubation Procedure Note ? ?Patient Details:   ?Name: Beth Barnes ?DOB: [birth year] ?MRN: [id number] ?  ?Airway Documentation:  ?  ?Vent end date: 05/15/21 Vent end time: 1100  ? ?Evaluation ? O2 sats: stable throughout ?Complications: No apparent complications ?Patient did tolerate procedure well. ?Bilateral Breath Sounds: Diminished, Rhonchi ?  ?Yes ? ?Pr extubated per MD order. Placed on 3L [place]. Positive cuff leak noted, no stridor heard. Vitals stable throughout. RT will continue to monitor.  ? ?Tobi Bastos ?05/15/2021, 11:03 AM ? ?

## 2021-05-15 NOTE — Progress Notes (Addendum)
Subjective: ?She has recovered spontaneous semipurposeful movement while still sedated.  ? ?Objective: ?Current vital signs: ?BP 95/75 (BP Location: Left Arm)   Pulse 90   Temp 98.8 ?F (37.1 ?C) (Bladder)   Resp 14   Ht 5\' 6"  (1.676 m)   Wt 65 kg   SpO2 100%   BMI 23.13 kg/m?  ?Vital signs in last 24 hours: ?Temp:  [97.2 ?F (36.2 ?C)-99.1 ?F (37.3 ?C)] 98.8 ?F (37.1 ?C) (04/06 0600) ?Pulse Rate:  [72-90] 90 (04/06 0100) ?Resp:  [12-15] 14 (04/06 0600) ?BP: (81-120)/(61-97) 95/75 (04/06 0600) ?SpO2:  [100 %] 100 % (04/06 0400) ?Arterial Line BP: (100-143)/(62-93) 100/64 (04/06 0600) ?FiO2 (%):  [50 %-70 %] 50 % (04/06 0400) ? ?Intake/Output from previous day: ?04/05 0701 - 04/06 0700 ?In: 2330.5 [I.V.:1753.6; NG/GT:100; IV Piggyback:477] ?Out: 1405 [Urine:1405] ?Intake/Output this shift: ?No intake/output data recorded. ?Nutritional status:  ?Diet Order   ? ?       ?  Diet NPO time specified  Diet effective now       ?  ? ?  ?  ? ?  ? ? ?HEENT: [place]/AT. Right eye prosthesis noted. No neck stiffness. ?Lungs: Intubated ?Ext: No edema ?  ?Neurologic Exam:  ?Ment: While still sedated on low-dose propofol and fentanyl, she exhibits agitated movement of upper and lower extremities to noxious stimuli without asymmetry. Does not open eyes in the context of her chronic blindness. Does not follow commands, react to voice or attempt to communicate.  ?CN: Right eye prosthesis. Left eye with dense corneal opacity. Corneal reflex on the left is present but weak (sedated). Face with improved tone compared to yesterday.  ?Motor/Sensory: Improved tone x 4 since yesterday. Agitated movements of upper and lower extremities to noxious stimuli without asymmetry is an improvement since yesterday.  ?Reflexes: Hypoactive in the context of sedation. Toes are mute ?Cerebellar/Gait: Unable to assess ? ?Lab Results: ?Results for orders placed or performed during the hospital encounter of 05/13/21 (from the past 48 hour(s))  ?Culture, blood  (routine x 2)     Status: None (Preliminary result)  ? Collection Time: 05/13/21  5:28 PM  ? Specimen: BLOOD  ?Result Value Ref Range  ? Specimen Description    ?  BLOOD A-LINE ?Performed at [facility], [place] [address] [place], [place] [address] ?  ? Special Requests IN PEDIATRIC BOTTLE Blood Culture adequate volume   ? Culture    ?  NO GROWTH < 12 HOURS ?Performed at [facility], [place] [address], [place], [place] [address] ?  ? Report Status PENDING   ?Culture, blood (routine x 2)     Status: None (Preliminary result)  ? Collection Time: 05/13/21  5:28 PM  ? Specimen: BLOOD  ?Result Value Ref Range  ? Specimen Description    ?  BLOOD A-LINE ?Performed at [facility], [place] [address] [place], [place] [address] ?  ? Special Requests IN PEDIATRIC BOTTLE Blood Culture adequate volume   ? Culture    ?  NO GROWTH < 12 HOURS ?Performed at [facility], [place] [address] [place],  [address] ?  ? Report Status PENDING   ?Type and screen [place]     Status: None  ? Collection Time: 05/13/21  5:37 PM  ?Result Value Ref Range  ? ABO/RH(D) O POS   ? Antibody Screen NEG   ? Sample Expiration    ?  05/16/2021,2359 ?Performed at [facility]  [place] [address], [place], [place] [address] ?  ?HIV Antibody (routine testing w rflx)     Status: None  ? Collection Time: 05/13/21  5:40 PM  ?Result Value Ref Range  ? HIV Screen 4th Generation wRfx Non Reactive Non Reactive  ?  Comment: Performed at [facility], [place] [address], [place], [place] [address]  ?CBC     Status: Abnormal  ? Collection Time: 05/13/21  5:40 PM  ?Result Value Ref Range  ? WBC 15.8 (H) 4.0 - 10.5 K/uL  ? RBC 4.40 3.87 - 5.11 MIL/uL  ? Hemoglobin 13.1 12.0 - 15.0 g/dL  ? HCT 42.3 36.0 - 46.0 %  ? MCV 96.1 80.0 - 100.0 fL  ? MCH 29.8 26.0 - 34.0 pg  ? MCHC 31.0 30.0 - 36.0 g/dL  ? RDW 20.7 (H) 11.5 - 15.5 %  ? Platelets 210 150 - 400 K/uL  ? nRBC 0.3 (H) 0.0 - 0.2 %   ?  Comment: Performed at [facility], [place] [address], [place], [place] [address]  ?Basic metabolic panel     Status: Abnormal  ? Collection Time: 05/13/21  5:40 PM  ?Result Value Ref Range  ? Sodium 137 135 - 145 mmol/L  ? Potassium 3.2 (L) 3.5 - 5.1 mmol/L  ? Chloride 97 (L) 98 - 111 mmol/L  ? CO2 18 (L) 22 - 32 mmol/L  ? Glucose, Bld 245 (H) 70 - 99 mg/dL  ?  Comment: Glucose reference range applies only to samples taken after fasting for at least 8 hours.  ? BUN 11 6 - 20 mg/dL  ? Creatinine, Ser 0.86 0.44 - 1.00 mg/dL  ? Calcium 8.1 (L) 8.9 - 10.3 mg/dL  ? GFR, Estimated >60 >60 mL/min  ?  Comment: (NOTE) ?Calculated using the CKD-EPI Creatinine Equation (2021) ?  ? Anion gap 22 (H) 5 - 15  ?  Comment: Electrolytes repeated to confirm. ?Performed at [facility], [place] [address] [place], [place] [address] ?  ?Magnesium     Status: Abnormal  ? Collection Time: 05/13/21  5:40 PM  ?Result Value Ref Range  ? Magnesium 1.6 (L) 1.7 - 2.4 mg/dL  ?  Comment: Performed at [facility], [place] [address], [place], [place] [address]  ?Phosphorus     Status: Abnormal  ? Collection Time: 05/13/21  5:40 PM  ?Result Value Ref Range  ? Phosphorus 5.1 (H) 2.5 - 4.6 mg/dL  ?  Comment: Performed at [facility], [place] [address] [place], [place] [address]  ?Lactic acid, plasma     Status: Abnormal  ? Collection Time: 05/13/21  5:40 PM  ?Result Value Ref Range  ? Lactic Acid, Venous >9.0 (HH) 0.5 - 1.9 mmol/L  ?  Comment: CRITICAL RESULT CALLED TO, READ BACK BY AND VERIFIED WITH: ?A.WATSON, RN AT 1905 ON 04.04.23 BY N.THOMPSON ?Performed at [facility], [place] [address], [place], [place] [address] ?  ?Troponin I (High Sensitivity)     Status: Abnormal  ? Collection Time: 05/13/21  5:40 PM  ?Result Value Ref Range  ? Troponin I (High Sensitivity) 280 (HH) <18 ng/L  ?  Comment: CRITICAL RESULT CALLED TO, READ BACK BY AND VERIFIED  WITH: ?A.WATSON, RN AT 1905 ON 04.04.23 BY N.THOMPSON ?(NOTE) ?Elevated high sensitivity troponin I (hsTnI) values and significant  ?changes across serial measurements may suggest ACS but many other  ?chronic and acute conditions are known  to elevate hsTnI results.  ?Refer to the Links section for chest pain algorithms and additional  ?guidance. ?Performed at [facility], [place] [address] ?[place], [place] [address] ?  ?Hemoglobin A1c     Status: Abnormal  ? Collection Time: 05/13/21  5:40 PM  ?Result Value Ref Range  ? Hgb A1c MFr Bld 4.7 (L) 4.8 - 5.6 %  ?  Comment: (NOTE) ?        Prediabetes: 5.7 - 6.4 ?        Diabetes: >6.4 ?        Glycemic control for adults with diabetes: <7.0 ?  ? Mean Plasma Glucose 88 mg/dL  ?  Comment: (NOTE) ?Performed At: [place] ?[address] [place], [place] [id number] ?Rush Farmer MD [id number] ?  ?Arterial Blood Gas     Status: Abnormal  ? Collection Time: 05/13/21  5:44 PM  ?Result Value Ref Range  ? FIO2 100.00 %  ? Delivery systems VENTILATOR   ? Mode PRESSURE REGULATED VOLUME CONTROL   ? MECHVT 470 mL  ? RATE 18.0 resp/min  ? PEEP 5.0 cm H20  ? pH, Arterial 7.38 7.35 - 7.45  ? pCO2 arterial 36 32 - 48 mmHg  ? pO2, Arterial 93 83 - 108 mmHg  ? Bicarbonate 21.3 20.0 - 28.0 mmol/L  ? Acid-base deficit 3.3 (H) 0.0 - 2.0 mmol/L  ? O2 Saturation 98.6 %  ? Patient temperature 37.0   ? Collection site A-LINE DRAW   ? Drawn by [id number]   ? Allens test (pass/fail) PASS PASS  ?  Comment: Performed at [facility], [place] [address] [place], [place] [address]  ?Glucose, capillary     Status: Abnormal  ? Collection Time: 05/13/21  6:33 PM  ?Result Value Ref Range  ? Glucose-Capillary 254 (H) 70 - 99 mg/dL  ?  Comment: Glucose reference range applies only to samples taken after fasting for at least 8 hours.  ?Troponin I (High Sensitivity)     Status: Abnormal  ? Collection Time: 05/13/21  6:49 PM  ?Result Value Ref Range  ? Troponin I  (High Sensitivity) 1,223 (HH) <18 ng/L  ?  Comment: CRITICAL VALUE NOTED.  VALUE IS CONSISTENT WITH PREVIOUSLY REPORTED AND CALLED VALUE. ?(NOTE) ?Elevated high sensitivity troponin I (hsTnI) values and significant  ?ch

## 2021-05-15 NOTE — Progress Notes (Signed)
EEG maintenance performed.  No skin breakdown observed at electrode sites Fp1, Fp2. 

## 2021-05-15 NOTE — Progress Notes (Signed)
? ?Progress Note ? ?Patient Name: Beth Barnes ?Date of Encounter: 05/15/2021 ? ?Primary Cardiologist:   None ? ? ?Subjective  ? ?Intubated and sedated ? ?Inpatient Medications  ?  ?Scheduled Meds: ? acetaminophen  650 mg Oral Q4H  ? Or  ? acetaminophen (TYLENOL) oral liquid 160 mg/5 mL  650 mg Per Tube Q4H  ? Or  ? acetaminophen  650 mg Rectal Q4H  ? chlorhexidine gluconate (MEDLINE KIT)  15 mL Mouth Rinse BID  ? Chlorhexidine Gluconate Cloth  6 each Topical Daily  ? dextrose      ? docusate  100 mg Per Tube BID  ? folic acid  1 mg Per Tube Daily  ? insulin aspart  0-9 Units Subcutaneous Q4H  ? mouth rinse  15 mL Mouth Rinse 10 times per day  ? pantoprazole (PROTONIX) IV  40 mg Intravenous QHS  ? pneumococcal 20-valent conjugate vaccine  0.5 mL Intramuscular Tomorrow-1000  ? polyethylene glycol  17 g Per Tube Daily  ? thiamine  100 mg Per Tube Daily  ? ?Continuous Infusions: ? sodium chloride Stopped (05/13/21 2310)  ? cefTRIAXone (ROCEPHIN)  IV Stopped (05/14/21 1749)  ? fentaNYL infusion INTRAVENOUS 75 mcg/hr (05/15/21 0600)  ? levETIRAcetam 1,500 mg (05/15/21 0711)  ? magnesium sulfate Stopped (05/13/21 1855)  ? midazolam 0.5 mg/hr (05/15/21 0600)  ? norepinephrine (LEVOPHED) Adult infusion 2 mcg/min (05/15/21 0600)  ? potassium chloride 10 mEq (05/15/21 0701)  ? propofol (DIPRIVAN) infusion 40 mcg/kg/min (05/15/21 0600)  ? ?PRN Meds: ?acetaminophen **OR** acetaminophen (TYLENOL) oral liquid 160 mg/5 mL **OR** acetaminophen, busPIRone **OR** busPIRone, docusate, fentaNYL, LORazepam, magnesium sulfate, ondansetron (ZOFRAN) IV, polyethylene glycol  ? ?Vital Signs  ?  ?Vitals:  ? 05/15/21 0430 05/15/21 0500 05/15/21 0600 05/15/21 0755  ?BP: 98/72 94/76 95/75 123/77  ?Pulse:    94  ?Resp: _0 ?Temp:  98.6 ?F (37 ?C) 98.8 ?F (37.1 ?C)   ?TempSrc:  Bladder Bladder   ?SpO2:    100%  ?Weight:      ?Height:      ? ? ?Intake/Output Summary (Last 24 hours) at 05/15/2021 0757 ?Last data filed at 05/15/2021 0600 ?Gross  per 24 hour  ?Intake 2330.51 ml  ?Output 1405 ml  ?Net 925.51 ml  ? ?Filed Weights  ? 05/13/21 2023 05/14/21 0038 05/14/21 0500  ?Weight: 65 kg 65 kg 65 kg  ? ? ?Telemetry  ?  ?NSR, prolonged QT, no ventricular arrhythmias - Personally Reviewed ? ?ECG  ?  ?NA - Personally Reviewed ? ?Physical Exam  ? ?GEN: No acute distress.   ?Neck: No  JVD ?Cardiac: RRR, no murmurs, rubs, or gallops.  ?Respiratory: Clear  to auscultation bilaterally. ?GI: Soft, nontender, non-distended  ?MS: No  edema; No deformity. ?Neuro:  Intubated, sedated ?Psych: Normal affect  ? ?Labs  ?  ?Chemistry ?Recent Labs  ?Lab 05/14/21 ?0443 05/14/21 ?4098 05/14/21 ?1431 05/15/21 ?0215  ?NA 132*  132* 131* 132* 135  ?K 2.4*  2.4* 3.8 3.5 2.8*  ?CL 97*  97*  --  97* 106  ?CO2 24  24  --  25 21*  ?GLUCOSE 148*  148*  --  197* 86  ?BUN 11  11  --  10 6  ?CREATININE 0.77  0.77  --  1.08* 0.68  ?CALCIUM 8.5*  8.5*  --  8.0* 7.8*  ?PROT  --   --  5.6*  --   ?ALBUMIN  --   --  2.7*  --   ?  AST  --   --  211*  --   ?ALT  --   --  110*  --   ?ALKPHOS  --   --  82  --   ?BILITOT  --   --  0.6  --   ?GFRNONAA >60  >60  --  >60 >60  ?ANIONGAP 11  11  --  10 8  ?  ? ?Hematology ?Recent Labs  ?Lab 05/13/21 ?1740 05/14/21 ?1093 05/14/21 ?0443 05/14/21 ?2355 05/15/21 ?0215  ?WBC 15.8*  --  19.0*  19.0*  --  10.4  ?RBC 4.40  --  4.27  4.27  --  4.22  ?HGB 13.1   < > 12.8  12.8 13.6 12.7  ?HCT 42.3   < > 38.1  38.1 40.0 37.8  ?MCV 96.1  --  89.2  89.2  --  89.6  ?MCH 29.8  --  30.0  30.0  --  30.1  ?MCHC 31.0  --  33.6  33.6  --  33.6  ?RDW 20.7*  --  20.5*  20.5*  --  20.4*  ?PLT 210  --  250  250  --  187  ? < > = values in this interval not displayed.  ? ? ?Cardiac EnzymesNo results for input(s): TROPONINI in the last 168 hours. No results for input(s): TROPIPOC in the last 168 hours.  ? ?BNPNo results for input(s): BNP, PROBNP in the last 168 hours.  ? ?DDimer No results for input(s): DDIMER in the last 168 hours.  ? ?Radiology  ?  ?DG Abd 1  View ? ?Result Date: 05/13/2021 ?CLINICAL DATA:  Nasogastric tube placement EXAM: ABDOMEN - 1 VIEW COMPARISON:  None. FINDINGS: Distal tip of nasogastric tube is seen in distal stomach. IMPRESSION: Distal tip of nasogastric tube seen in distal stomach. Electronically Signed   By: Marijo Conception M.D.   On: 05/13/2021 18:12  ? ?CT HEAD WO CONTRAST (5MM) ? ?Result Date: 05/13/2021 ?CLINICAL DATA:  Acute neurological deficit.  Stroke suspected. EXAM: CT HEAD WITHOUT CONTRAST TECHNIQUE: Contiguous axial images were obtained from the base of the skull through the vertex without intravenous contrast. RADIATION DOSE REDUCTION: This exam was performed according to the departmental dose-optimization program which includes automated exposure control, adjustment of the mA and/or kV according to patient size and/or use of iterative reconstruction technique. COMPARISON:  None. FINDINGS: Brain: No evidence of acute infarction, hemorrhage, hydrocephalus, extra-axial collection or mass lesion/mass effect. Vascular: No hyperdense vessel or unexpected calcification. Skull: Normal. Negative for fracture or focal lesion. Sinuses/Orbits: Paranasal sinuses are clear. Right ocular prosthesis is suggested. Other: None. IMPRESSION: No acute intracranial abnormalities. Electronically Signed   By: Lucienne Capers M.D.   On: 05/13/2021 21:53  ? ?DG CHEST PORT 1 VIEW ? ?Result Date: 05/14/2021 ?CLINICAL DATA:  Cardiac arrest EXAM: PORTABLE CHEST 1 VIEW COMPARISON:  05/13/2021 FINDINGS: Endotracheal tube, nasogastric tube, are unchanged. Pulmonary insufflation is stable. Extensive bilateral upper lung zone airspace infiltrate appears stable. There is progressive infiltrate within the left lower lung zone. No pneumothorax or pleural effusion. Cardiac size within normal limits. No acute bone abnormality. IMPRESSION: Stable support tubes. Mild pulmonary hypoinflation, stable. Progressive extensive asymmetric airspace infiltrate in keeping with  asymmetric pulmonary edema or infection. Electronically Signed   By: Fidela Salisbury M.D.   On: 05/14/2021 01:50  ? ?DG Chest Port 1 View ? ?Result Date: 05/13/2021 ?CLINICAL DATA:  Endotracheal tube. EXAM: PORTABLE CHEST 1 VIEW COMPARISON:  Chest x-ray 11/30/2020. FINDINGS: Endotracheal  tube tip is approximately 2.9 cm above the carina. Enteric tube extends below the diaphragm. Lines and tubes overlie the chest. There is dense airspace disease in the bilateral upper lobes, left greater than right. Costophrenic angles are clear. No pneumothorax or pleural effusion identified. Cardiomediastinal silhouette is within normal limits. Osseous structures are within normal limits. IMPRESSION: 1. Endotracheal tube tip 2.9 cm above the carina. 2. Bilateral upper lobe airspace disease, left greater than right. Findings may be related to infection or hemorrhage. Correlate clinically. Electronically Signed   By: Ronney Asters M.D.   On: 05/13/2021 18:08  ? ?EEG adult ? ?Result Date: 05/14/2021 ?Lora Havens, MD     05/14/2021 10:35 AM Patient Name: Beth Barnes MRN: [id number] Epilepsy Attending: Lora Havens Referring Physician/Provider: Candee Furbish, MD Date: 05/14/2021 Duration: 23.21 mins Patient history: 26 year old woman with hx of blindness, polysubstance abuse who was visiting [place] and found unresponsive, unknown downtime. Now with myoclonus. EEG to evaluate for seizure  Level of alertness:  comatose  AEDs during EEG study: propofol, versed, LEV Technical aspects: This EEG study was done with scalp electrodes positioned according to the 10-20 International system of electrode placement. Electrical activity was acquired at a sampling rate of 500Hz and reviewed with a high frequency filter of 70Hz and a low frequency filter of 1Hz. EEG data were recorded continuously and digitally stored. Description: EEG showed continuous generalized polymorphic mixed frequencies with predominantly 3 to 7 Hz theta-delta  slowing admixed with intermittent generalized 15 to 18 Hz beta activity.  Hyperventilation and photic stimulation were not performed.   ABNORMALITY - Continuous slow, generalized IMPRESSION: This study is suggestive of severe diffuse en

## 2021-05-16 ENCOUNTER — Other Ambulatory Visit (HOSPITAL_COMMUNITY): Payer: Self-pay

## 2021-05-16 DIAGNOSIS — I469 Cardiac arrest, cause unspecified: Secondary | ICD-10-CM | POA: Diagnosis not present

## 2021-05-16 DIAGNOSIS — G253 Myoclonus: Secondary | ICD-10-CM | POA: Diagnosis not present

## 2021-05-16 LAB — BASIC METABOLIC PANEL
Anion gap: 5 (ref 5–15)
Anion gap: 12 (ref 5–15)
BUN: 5 mg/dL — ABNORMAL LOW (ref 6–20)
BUN: <5 mg/dL — ABNORMAL LOW (ref 6–20)
CO2: 24 mmol/L (ref 22–32)
CO2: 20 mmol/L — ABNORMAL LOW (ref 22–32)
Calcium: 8.1 mg/dL — ABNORMAL LOW (ref 8.9–10.3)
Calcium: 9.1 mg/dL (ref 8.9–10.3)
Chloride: 112 mmol/L — ABNORMAL HIGH (ref 98–111)
Chloride: 110 mmol/L (ref 98–111)
Creatinine, Ser: 0.53 mg/dL (ref 0.44–1.00)
Creatinine, Ser: 0.67 mg/dL (ref 0.44–1.00)
GFR, Estimated: >60 mL/min (ref >60)
GFR, Estimated: >60 mL/min (ref >60)
Glucose, Bld: 79 mg/dL (ref 70–99)
Glucose, Bld: 85 mg/dL (ref 70–99)
Potassium: 2.8 mmol/L — ABNORMAL LOW (ref 3.5–5.1)
Potassium: 4 mmol/L (ref 3.5–5.1)
Sodium: 141 mmol/L (ref 135–145)
Sodium: 142 mmol/L (ref 135–145)

## 2021-05-16 LAB — CBC
HCT: 31 % — ABNORMAL LOW (ref 36.0–46.0)
Hemoglobin: 9.7 g/dL — ABNORMAL LOW (ref 12.0–15.0)
MCH: 29.6 pg (ref 26.0–34.0)
MCHC: 31.3 g/dL (ref 30.0–36.0)
MCV: 94.5 fL (ref 80.0–100.0)
Platelets: 146 10*3/uL — ABNORMAL LOW (ref 150–400)
RBC: 3.28 MIL/uL — ABNORMAL LOW (ref 3.87–5.11)
RDW: 19.9 % — ABNORMAL HIGH (ref 11.5–15.5)
WBC: 6.7 10*3/uL (ref 4.0–10.5)
nRBC: 0 % (ref 0.0–0.2)

## 2021-05-16 LAB — GLUCOSE, CAPILLARY
Glucose-Capillary: 81 mg/dL (ref 70–99)
Glucose-Capillary: 87 mg/dL (ref 70–99)
Glucose-Capillary: 66 mg/dL — ABNORMAL LOW (ref 70–99)
Glucose-Capillary: 49 mg/dL — ABNORMAL LOW (ref 70–99)
Glucose-Capillary: 55 mg/dL — ABNORMAL LOW (ref 70–99)
Glucose-Capillary: 59 mg/dL — ABNORMAL LOW (ref 70–99)

## 2021-05-16 LAB — PHOSPHORUS: Phosphorus: 2.8 mg/dL (ref 2.5–4.6)

## 2021-05-16 LAB — MAGNESIUM: Magnesium: 1.9 mg/dL (ref 1.7–2.4)

## 2021-05-16 MED ORDER — HALOPERIDOL LACTATE 5 MG/ML IJ SOLN: 5.0000 mg | Freq: Once | INTRAMUSCULAR | Status: AC

## 2021-05-16 MED ORDER — HALOPERIDOL LACTATE 5 MG/ML IJ SOLN: 5.0000 mg | Freq: Four times a day (QID) | INTRAMUSCULAR | Status: DC | PRN

## 2021-05-16 MED ORDER — CARVEDILOL 6.25 MG PO TABS
6.2500 mg | ORAL_TABLET | Freq: Two times a day (BID) | ORAL | Status: DC
  Administered 2021-05-16 – 2021-05-19 (×7): 6.25 mg
  Filled 2021-05-16 (×7): qty 1

## 2021-05-16 MED ORDER — MAGNESIUM SULFATE 2 GM/50ML IV SOLN
2.0000 g | Freq: Once | INTRAVENOUS | Status: AC
Start: 2021-05-16 — End: 2021-05-17
  Administered 2021-05-16: 2 g via INTRAVENOUS
  Filled 2021-05-16: qty 50

## 2021-05-16 MED ORDER — SPIRONOLACTONE 25 MG PO TABS
25.0000 mg | ORAL_TABLET | Freq: Every day | ORAL | Status: DC
  Administered 2021-05-16 – 2021-05-20 (×5): 25 mg
  Filled 2021-05-16 (×5): qty 1

## 2021-05-16 MED ORDER — POTASSIUM CHLORIDE 10 MEQ/50ML IV SOLN: 10.0000 meq | INTRAVENOUS | Status: DC

## 2021-05-16 MED ORDER — CARVEDILOL 6.25 MG PO TABS: 6.2500 mg | ORAL_TABLET | Freq: Two times a day (BID) | ORAL | Status: DC

## 2021-05-16 MED ORDER — LEVETIRACETAM 250 MG PO TABS
1500.0000 mg | ORAL_TABLET | Freq: Once | ORAL | Status: AC
  Administered 2021-05-16: 1500 mg via ORAL
  Filled 2021-05-16: qty 6

## 2021-05-16 MED ORDER — POTASSIUM CHLORIDE 10 MEQ/50ML IV SOLN
10.0000 meq | INTRAVENOUS | Status: DC
  Administered 2021-05-16 (×2): 10 meq via INTRAVENOUS
  Filled 2021-05-16 (×6): qty 50

## 2021-05-16 MED ORDER — LEVETIRACETAM 100 MG/ML PO SOLN
1500.0000 mg | Freq: Once | ORAL | Status: DC
  Filled 2021-05-16: qty 15

## 2021-05-16 MED ORDER — POTASSIUM CHLORIDE 20 MEQ PO PACK
40.0000 meq | PACK | Freq: Two times a day (BID) | ORAL | Status: AC
  Administered 2021-05-16 – 2021-05-17 (×3): 40 meq via ORAL
  Filled 2021-05-16 (×3): qty 2

## 2021-05-16 MED ORDER — HALOPERIDOL LACTATE 5 MG/ML IJ SOLN
INTRAMUSCULAR | Status: AC
  Administered 2021-05-16: 5 mg via INTRAMUSCULAR
  Filled 2021-05-16: qty 1

## 2021-05-16 MED ORDER — SPIRONOLACTONE 25 MG PO TABS: 25.0000 mg | ORAL_TABLET | Freq: Every day | ORAL | Status: DC

## 2021-05-16 NOTE — Progress Notes (Addendum)
? ?Progress Note ? ?Patient Name: Ahtziri Cropper ?Date of Encounter: 05/16/2021 ? ?Primary Cardiologist:   None ? ? ?Subjective  ? ?Awake, confused.  Extubated.  Denies pain ? ?Inpatient Medications  ?  ?Scheduled Meds: ? chlorhexidine gluconate (MEDLINE KIT)  15 mL Mouth Rinse BID  ? Chlorhexidine Gluconate Cloth  6 each Topical Daily  ? folic acid  1 mg Per Tube Daily  ? insulin aspart  0-9 Units Subcutaneous Q4H  ? pneumococcal 20-valent conjugate vaccine  0.5 mL Intramuscular Tomorrow-1000  ? thiamine  500 mg Per Tube Daily  ? ?Continuous Infusions: ? dexmedetomidine (PRECEDEX) IV infusion 0.4 mcg/kg/hr (05/16/21 0700)  ? fentaNYL infusion INTRAVENOUS Stopped (05/15/21 1048)  ? levETIRAcetam Stopped (05/16/21 1275)  ? norepinephrine (LEVOPHED) Adult infusion Stopped (05/15/21 1017)  ? potassium chloride 10 mEq (05/16/21 0750)  ? ?PRN Meds: ?docusate, LORazepam, polyethylene glycol  ? ?Vital Signs  ?  ?Vitals:  ? 05/16/21 0600 05/16/21 0700 05/16/21 0713 05/16/21 0816  ?BP: (!) 137/111 (!) 128/105    ?Pulse: 91 85 81   ?Resp: (!) 21 (!) 26 (!) 30   ?Temp:    99.1 ?F (37.3 ?C)  ?TempSrc:      ?SpO2: 95% (!) 88% 93%   ?Weight:      ?Height:      ? ? ?Intake/Output Summary (Last 24 hours) at 05/16/2021 0837 ?Last data filed at 05/16/2021 0700 ?Gross per 24 hour  ?Intake 574.79 ml  ?Output 1005 ml  ?Net -430.21 ml  ? ?Filed Weights  ? 05/14/21 0038 05/14/21 0500 05/16/21 0400  ?Weight: 65 kg 65 kg 64.8 kg  ? ? ?Telemetry  ?  ?NSR, STno - Personally Reviewed ? ?ECG  ?  ?NA - Personally Reviewed ? ?Physical Exam  ? ?GENERAL:   Confused ?NECK:  No jugular venous distention, waveform within normal limits, carotid upstroke brisk and symmetric, no bruits, no thyromegaly ?LUNGS:  Clear to auscultation bilaterally ?CHEST:  Unremarkable ?HEART:  PMI not displaced or sustained,S1 and S2 within normal limits, no S3, no S4, no clicks, no rubs, no murmurs ?ABD:  Flat, positive bowel sounds normal in frequency in pitch, no bruits, no  rebound, no guarding, no midline pulsatile mass, no hepatomegaly, no splenomegaly ?EXT:  2 plus pulses throughout, no edema, no cyanosis no clubbing ? ? ?Labs  ?  ?Chemistry ?Recent Labs  ?Lab 05/14/21 ?1431 05/15/21 ?0215 05/16/21 ?0409  ?NA 132* 135 141  ?K 3.5 2.8* 2.8*  ?CL 97* 106 112*  ?CO2 25 21* 24  ?GLUCOSE 197* 86 79  ?BUN 10 6 5*  ?CREATININE 1.08* 0.68 0.53  ?CALCIUM 8.0* 7.8* 8.1*  ?PROT 5.6*  --   --   ?ALBUMIN 2.7*  --   --   ?AST 211*  --   --   ?ALT 110*  --   --   ?ALKPHOS 56  --   --   ?BILITOT 0.6  --   --   ?GFRNONAA >60 >60 >60  ?ANIONGAP '10 8 5  ' ?  ? ?Hematology ?Recent Labs  ?Lab 05/14/21 ?0443 05/14/21 ?1700 05/15/21 ?0215 05/16/21 ?0409  ?WBC 19.0*  19.0*  --  10.4 6.7  ?RBC 4.27  4.27  --  4.22 3.28*  ?HGB 12.8  12.8 13.6 12.7 9.7*  ?HCT 38.1  38.1 40.0 37.8 31.0*  ?MCV 89.2  89.2  --  89.6 94.5  ?MCH 30.0  30.0  --  30.1 29.6  ?MCHC 33.6  33.6  --  33.6 31.3  ?  RDW 20.5*  20.5*  --  20.4* 19.9*  ?PLT 250  250  --  187 146*  ? ? ?Cardiac EnzymesNo results for input(s): TROPONINI in the last 168 hours. No results for input(s): TROPIPOC in the last 168 hours.  ? ?BNPNo results for input(s): BNP, PROBNP in the last 168 hours.  ? ?DDimer No results for input(s): DDIMER in the last 168 hours.  ? ?Radiology  ?  ?EEG adult ? ?Result Date: 05/14/2021 ?Lora Havens, MD     05/14/2021 10:35 AM Patient Name: Christinea Brizuela MRN: [id number] Epilepsy Attending: Lora Havens Referring Physician/Provider: Candee Furbish, MD Date: 05/14/2021 Duration: 23.21 mins Patient history: 26 year old woman with hx of blindness, polysubstance abuse who was visiting boyfriend in [place] and found unresponsive, unknown downtime. Now with myoclonus. EEG to evaluate for seizure  Level of alertness:  comatose  AEDs during EEG study: propofol, versed, LEV Technical aspects: This EEG study was done with scalp electrodes positioned according to the 10-20 International system of electrode placement. Electrical activity  was acquired at a sampling rate of '500Hz'  and reviewed with a high frequency filter of '70Hz'  and a low frequency filter of '1Hz' . EEG data were recorded continuously and digitally stored. Description: EEG showed continuous generalized polymorphic mixed frequencies with predominantly 3 to 7 Hz theta-delta slowing admixed with intermittent generalized 15 to 18 Hz beta activity.  Hyperventilation and photic stimulation were not performed.   ABNORMALITY - Continuous slow, generalized IMPRESSION: This study is suggestive of severe diffuse encephalopathy, nonspecific etiology.  No seizures or epileptiform discharges were seen throughout the recording. Priyanka Barbra Sarks  ? ?Overnight EEG with video ? ?Result Date: 05/15/2021 ?Lora Havens, MD     05/15/2021  1:56 PM Patient Name: Maclovia Uher MRN: [id number] Epilepsy Attending: Lora Havens Referring Physician/Provider: Candee Furbish, MD Duration: 05/14/2021 0930 to 05/15/2021 0930  Patient history: 26 year old woman with hx of blindness, polysubstance abuse who was visiting boyfriend in [place] and found unresponsive, unknown downtime. Now with myoclonus. EEG to evaluate for seizure  Level of alertness:  comatose  AEDs during EEG study: propofol, versed, LEV  Technical aspects: This EEG study was done with scalp electrodes positioned according to the 10-20 International system of electrode placement. Electrical activity was acquired at a sampling rate of '500Hz'  and reviewed with a high frequency filter of '70Hz'  and a low frequency filter of '1Hz' . EEG data were recorded continuously and digitally stored.  Description: At the beginning of study, EEG showed continuous generalized polymorphic mixed frequencies with predominantly 3 to 7 Hz theta-delta slowing admixed with intermittent generalized 15 to 18 Hz beta activity. Gradually, EEG improved and showed predominantly 8-'9hz'  generalized alpha activity. Hyperventilation and photic stimulation were not performed.    ABNORMALITY -  Continuous slow, generalized  IMPRESSION: This study was initially suggestive of severe diffuse encephalopathy, nonspecific etiology. Gradually, EEG improved and was suggestive of mild to moderate diffuse encephalopathy, likely due to sedation.  No seizures or epileptiform discharges were seen throughout the recording.  Priyanka Barbra Sarks   ? ?Cardiac Studies  ? ?Echo: ? ?1. Left ventricular ejection fraction, by estimation, is <20%. The left  ?ventricle has severely decreased function. The left ventricle demonstrates  ?global hypokinesis. Left ventricular diastolic parameters are consistent  ?with Grade II diastolic  ?dysfunction (pseudonormalization).  ? 2. Right ventricular systolic function is moderately reduced. The right  ?ventricular size is normal.  ? 3. The mitral valve is normal  in structure. No evidence of mitral valve  ?regurgitation.  ? 4. The aortic valve is tricuspid. Aortic valve regurgitation is not  ?visualized. No aortic stenosis is present.  ? ? ?Patient Profile  ?   ?26 y.o. female status post VFib arrest. ? ?Assessment & Plan  ?  ?VFib:    Amiodarone off .    No further ectopy.   ? ?Hypokalemia:    Continued without obvious cause other than increased epi related to stress as can be seen with ETOH withdrawal.  However, this should be transient.  Also could be renal wasting of multiple etiologies and I will order a spot urinary potassium level. .  Continue to supplement  ? ?Hypokalemia:  Being supplemented.    ? ?Cardiomyopathy:  No evidence of acute ischemic event.  Possible EtOH.   Started on low dose carvedilol and likely can titrate afterload reduction in the AM.   ? ?Neuro:  Awake, extubated and answering questions.  ? ? ?For questions or updates, please contact [facility] ?Please consult [email or web address] for contact info under Cardiology/STEMI. ?  ?Signed, ?Minus Breeding, MD  ?05/16/2021, 8:37 AM   ? ?

## 2021-05-16 NOTE — Progress Notes (Signed)
Heart Failure Navigator Progress Note ? ?Following this hospitalization to assess for HV TOC readiness.  ? ?EF <20% ?Confused ? ?Rhae Hammock, BSN, RN ?Heart Failure Nurse Navigator ?Secure Chat Only ? ?

## 2021-05-16 NOTE — Progress Notes (Signed)
[employer] ADULT ICU REPLACEMENT PROTOCOL ? ? ?The patient does apply for the [facility] Adult ICU Electrolyte Replacment Protocol based on the criteria listed below:  ? ?1.Exclusion criteria: TCTS patients, ECMO patients, and Dialysis patients ?2. Is GFR >/= 30 ml/min? Yes.    ?Patient's GFR today is >60 ?3. Is SCr </= 2? Yes.   ?Patient's SCr is 0.53 mg/dL ?4. Did SCr increase >/= 0.5 in 24 hours? No. ?5.Pt's weight >40kg  Yes.   ?6. Abnormal electrolyte(s): K 2.8  ?7. Electrolytes replaced per protocol ? ? ?Ardelle Park 05/16/2021 6:07 AM  ?

## 2021-05-16 NOTE — Progress Notes (Signed)
Heart Failure Stewardship Pharmacist Progress Note ? ? ?PCP: Patient, No Pcp Per (Inactive) ?PCP-Cardiologist: None  ? ? ?HPI:  ?26 yo F with PMH of blindness, glaucoma, and polysubstance abuse. She was visiting her boyfriend on [place] and was found unresponsive. Recived CPR, epi, Narcan, and bicarb during ACLS for vfib arrest. Obtained ROSC and was directly admitted to the ICU after intubation. An ECHO was done on 4/5 and LVEF <20% with G2DD an moderately reduced RV function. Extubated 4/6. ? ?Current HF Medications: ?Beta Blocker: carvedilol 6.25 mg BID ?Aldosterone Antagonist: spironolactone 25 mg daily ? ?Prior to admission HF Medications: ?ACE/ARB/ARNI: losartan 50 mg daily ? ?Pertinent Lab Values: ?Serum creatinine 0.53, BUN 5, Potassium 2.8, Sodium 141, Magnesium 1.9, A1c 4.7  ? ?Vital Signs: ?Weight: 142 lbs (admission weight: 143 lbs) ?Blood pressure: 130/80-150/100s  ?Heart rate: 80-100s  ?I/O: -1.4L yesterday; net +1.2L ? ?Medication Assistance / Insurance Benefits Check: ?Does the patient have prescription insurance?  Yes ?Type of insurance plan: [place] Medicaid ? ?Outpatient Pharmacy:  ?Prior to admission outpatient [facility] ?Is the patient willing to use [facility] at discharge? Yes ?Is the patient willing to transition their outpatient pharmacy to utilize a [facility] outpatient pharmacy?   Pending ?  ? ?Assessment: ?1. Acute systolic CHF (EF 99991111), likely due to NICM. NYHA class II symptoms. ?- Agree with starting carvedilol 6.25 mg BID ?- Plan to start ACE/ARB/ARNI in AM ?- Agree with starting spironolactone 25 mg daily ?- Consider adding SGLT2i prior to discharge ?- K replaced ?  ?Plan: ?1) Medication changes recommended at this time: ?- Agree with changes as above ?- Will submit prior authorizations for meds as they are added ? ?2) Patient assistance: ?- Prior authorization required for Jardiance, Farxiga, and Entresto ? ?3)  Education  ?- To be completed prior to discharge ? ?Kerby Nora,  PharmD, BCPS ?Heart Failure Stewardship Pharmacist ?Phone [phone] ? ? ?

## 2021-05-16 NOTE — Progress Notes (Signed)
? ?NAME:  Geet Shiner, MRN:  [id number], DOB:  24-Jun-1995, LOS: 3 ?ADMISSION DATE:  05/13/2021, CONSULTATION DATE:  05/13/21 ?REFERRING MD:  N/A, CHIEF COMPLAINT:  Cardiac arrest  ? ?History of Present Illness:  ?26 year old woman with hx of glaucoma and blindness, polysubstance abuse who was visiting boyfriend in [place] and found unresponsive, unknown downtime.  CPR begun immediately (see separate note).  CRNA intubated.  Initial rhythm vfib.  ROSC obtained and patient direct admitted to ICU.  History obtained from boyfriend. She left briefly and then came back.  History of cocaine/etoh/MJ abuse.  He denies she has any IVDA or opiate use.  All her family is in [place]. ? ?Family arrived and state that patient has a history of heavy binge-drinking, had recently been trying to cut back recently, along with recent ER visits for generally not feeling well and had been told her potassium was low ? ?Pertinent  Medical History  ? ?Past Medical History:  ?Diagnosis Date  ? Blind   ? Congenital glaucoma of both eyes   ? Hypokalemia   ? ?Significant Hospital Events: ?Including procedures, antibiotic start and stop dates in addition to other pertinent events   ?4/4 cardiac arrest ~10 min downtime ?4/5 Transferred to [facility] for LTM EEG, intubated on Levophed ?4/5 EEG>This study is suggestive of severe diffuse encephalopathy, nonspecific etiology.  No seizures or epileptiform discharges were seen throughout the recording. ?Echo 4/5>1. Left ventricular ejection fraction, by estimation, is <20%. The left  ?ventricle has severely decreased function. The left ventricle demonstrates  ?global hypokinesis. Left ventricular diastolic parameters are consistent  ?with Grade II diastolic  ?dysfunction (pseudonormalization).  ? 2. Right ventricular systolic function is moderately reduced. The right  ?ventricular size is normal.  ?4/4 CTH>no acute findings ?4/6 more awake today - Extubated.  ? ?Interim History / Subjective:  ?Extubated yesterday, following  commands but remains confused at times.  ? ?Objective   ?Blood pressure (!) 134/113, pulse 84, temperature 99.1 ?F (37.3 ?C), resp. rate 20, height 5\' 6"  (1.676 m), weight 64.8 kg, SpO2 96 %. ?   ?Vent Mode: PSV;CPAP ?FiO2 (%):  [40 %] 40 % ?PEEP:  [5 cmH20] 5 cmH20 ?Pressure Support:  [5 cmH20] 5 cmH20  ? ?Intake/Output Summary (Last 24 hours) at 05/16/2021 1017 ?Last data filed at 05/16/2021 0700 ?Gross per 24 hour  ?Intake 416.81 ml  ?Output 955 ml  ?Net -538.19 ml  ? ? ?Filed Weights  ? 05/14/21 0038 05/14/21 0500 05/16/21 0400  ?Weight: 65 kg 65 kg 64.8 kg  ? ?General:  somnolent, lying in bed in no distress.  ?HEENT: MM pink/moist, R eye prosthesis and L cornea opacified ?Neuro: Eyes closed but follows commands and moves all limbs with normal strength. Disoriented and inattentive. Agitation at times but redirectable.  ?CV: s1s2 rrr, no m/r/g, no edema or JVD ?PULM:  chest clear bilaterally.  ?GI: soft, bsx4 active  ?Extremities: warm/dry, no edema  ?Skin: no rashes or lesions ? ?POC echo shows mild improvement in EF  ? ?Ancillary tests personally reviewed:  ?CT head 4/4 normal. ?Hypokalemia.  ? ?Assessment & Plan:  ? ?Witnessed in Hospital Vfib cardiac arrest likely secondary to severe cardiomyopathy, prolonged Qtc and hypokalemia  ?Subsequent acute hypoxic respiratory failure and cardiogenic shock  ?Hx of ETOH abuse ?Reported cocaine and marijuana abuse ?Post arrest encephalopathy ?Persistent hypokalemia.  ? ?Plan:  ? ?- young age and relatively brief arrest are in her favor. She is already making progress.  She is  now extubated and will likely make a favorable recovery. ?- Off all sedation, will continue Keppra orally for now. ?- Started oral carvedilol today .  ?- Potassium replacement. Cause of hypokalemia will need further investigation: possible increased urinary loss due to alcohol use vs. Hyperaldosteronism - would benefit from spironolactone for HF. ?- Will need reliable contraception while on HF therapy.   ?- High dose thiamine as B1 deficiency might be contributing factor.  ?- Spoke to family 4/6 and updated them of plan and possibility of slow recovery ?- PT/OT ?- If remains calm today can transfer to telemetry. ? ?Best Practice (right click and "Reselect all SmartList Selections" daily)  ? ?Diet/type: NPO - start tube feeds ?DVT prophylaxis: SCD ?GI prophylaxis: PPI ?Lines: Central line ?Foley:  Yes, and it is still needed ?Code Status:  full code ?Last date of multidisciplinary goals of care discussion [Pt's mother updated at the bedside with guarded prognosis, continue full code 4/5] ? ?Lynnell Catalan, MD FRCPC ?ICU Physician ?[facility]  ?Pager: [phone] ?Mobile: [phone] ?After hours: [phone].  ? ? ?

## 2021-05-16 NOTE — Evaluation (Addendum)
Physical Therapy Evaluation ?Patient Details ?Name: Beth Barnes ?MRN: [id number] ?DOB: 1995/06/04 ?Today's Date: 05/16/2021 ? ?History of Present Illness ? Pt is a 26 y.o. female who presented 05/13/21 while visiting boyfriend in hospital and was found to be unresponsive with an unknown downtime with V-fib cardiac arrest. Pt received CPR for ~10 min before ROSC. Postcardiac arrest, pt exhibiting whole body myoclonic jerking but stat rapid EEG negative for seizures. CT of head 4/4 negative for acute intracranial abnormalities. Urinary toxicology screen positive only for benzodiazepines. ETT 4/4 - 4/6. Awaiting brain MRI. 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  ?Clinical Impression ? Pt presents with condition above and deficits mentioned below, see PT Problem List. PTA, she was living with her SO, functioning modI with her walking/seeing stick for guidance due to her baseline vision deficits. Per mother, pt can see shadows, but that is all. The plan is to go home with her mother in her 1-level house with a ramped entrance option. Her mother works from home and can assist the pt 24/7. She also has other family than can assist as needed. Currently, pt is displaying gross strength of 5/5 with MMT in all 4 limbs and intact sensation. However, she displays functional weakness, exhibited by her knee buckling when ambulating, and incoordination. In addition, pt displays deficits in balance, activity tolerance, and cognition. These all place her at high risk for falls and injuries. Pt required minA for bed mobility, min-modA for transfers to stand, and modA with intermittent UE support to take a few steps today. As pt is very young, motivated to be IND, and has great family support along with a significant functional decline, would recommend pt receive intensive therapy in the AIR setting prior to going home with her mother. Will continue to follow acutely. ?    ? ?Recommendations for follow up therapy are one component of a multi-disciplinary discharge planning process, led by the attending physician.  Recommendations may be updated based on patient status, additional functional criteria and insurance authorization. ? ?Follow Up Recommendations Acute inpatient rehab (3hours/day) ? ?  ?Assistance Recommended at Discharge Frequent or constant Supervision/Assistance  ?Patient can return home with the following ? A lot of help with walking and/or transfers;A lot of help with bathing/dressing/bathroom;Assistance with cooking/housework;Direct supervision/assist for medications management;Direct supervision/assist for financial management;Assist for transportation;Help with stairs or ramp for entrance ? ?  ?Equipment Recommendations None recommended by PT  ?Recommendations for Other Services ? Rehab consult  ?  ?Functional Status Assessment Patient has had a recent decline in their functional status and demonstrates the ability to make significant improvements in function in a reasonable and predictable amount of time.  ? ?  ?Precautions / Restrictions Precautions ?Precautions: Fall;Other (comment) ?Precaution Comments: watch HR; impulsive/restless ?Restrictions ?Weight Bearing Restrictions: No  ? ?  ? ?Mobility ? Bed Mobility ?Overal bed mobility: Needs Assistance ?Bed Mobility: Supine to Sit ?  ?  ?Supine to sit: Min assist, HOB elevated ?  ?  ?General bed mobility comments: Pt needing minA to encourage her and direct her towards R EOB with HOB slightly elevated. Pt repeatedly getting distracted and becoming emotional asking about things that did not happen. ?  ? ?Transfers ?Overall transfer level: Needs assistance ?Equipment used: 1 person hand held assist ?Transfers: Sit to/from Stand, Bed to chair/wheelchair/BSC ?Sit to Stand: Min assist, Mod assist ?  ?Step pivot transfers: Mod assist ?  ?  ?  ?General transfer comment: Pt requiring minA to  power up and steady with first  sit to stand rep from EOB to PT anterior to her, but required modA the second rep from the commode as pt was not following cues to come to a full stand. ModA to steady pt with stand step bed > commode > recliner with intermittent UE support on PT, noted knee buckling and LOB, increased posterior lean with increased time in standing. ?  ? ?Ambulation/Gait ?Ambulation/Gait assistance: Mod assist ?Gait Distance (Feet): 4 Feet ?Assistive device: 1 person hand held assist ?Gait Pattern/deviations: Step-through pattern, Decreased step length - right, Decreased step length - left, Decreased stride length, Knees buckling, Leaning posteriorly ?Gait velocity: reduced ?Gait velocity interpretation: <1.31 ft/sec, indicative of household ambulator ?  ?General Gait Details: Pt with unsteady gait with intermittent UE support on PT, needing cues to guide pt due to lack of vision at baseline. Pt with noted knee buckling bil and LOB bouts, needing modA to maintain safety. Pt exhibited increased posterior lean as time in standing progressed. ? ?Stairs ?  ?  ?  ?  ?  ? ?Wheelchair Mobility ?  ? ?Modified Rankin (Stroke Patients Only) ?Modified Rankin (Stroke Patients Only) ?Pre-Morbid Rankin Score: No symptoms ?Modified Rankin: Moderately severe disability ? ?  ? ?Balance Overall balance assessment: Needs assistance ?Sitting-balance support: Feet supported, Bilateral upper extremity supported, Single extremity supported ?Sitting balance-Leahy Scale: Poor ?Sitting balance - Comments: UE support, min guard for safety ?Postural control: Posterior lean ?Standing balance support: Single extremity supported, No upper extremity supported, During functional activity ?Standing balance-Leahy Scale: Poor ?Standing balance comment: Reliant on UE support or external physical assistance, LOB bouts noted wiht knee buckling ?  ?  ?  ?  ?  ?  ?  ?  ?  ?  ?  ?   ? ? ? ?Pertinent Vitals/Pain Pain Assessment ?Pain Assessment: Faces ?Faces Pain Scale: No  hurt ?Pain Intervention(s): Monitored during session  ? ? ?Home Living Family/patient expects to be discharged to:: Private residence ?Living Arrangements: Parent (mother) ?Available Help at Discharge: Family;Available 24 hours/day ?Type of Home: House ?Home Access: Stairs to enter;Ramped entrance (ramp option) ?Entrance Stairs-Rails: None ?Entrance Stairs-Number of Steps: 3 ?  ?Home Layout: One level ?Home Equipment: Rolling Walker (2 wheels);BSC/3in1;Shower seat - built in;Grab bars - tub/shower;Hand held shower head;Adaptive equipment ?Additional Comments: info above for pt's mother's house as plan is to d/c home with mother instead of back to pt living with SO  ?  ?Prior Function Prior Level of Function : Independent/Modified Independent ?  ?  ?  ?  ?  ?  ?Mobility Comments: Pt would ambulate using her walking stick secondary to being blind. ?ADLs Comments: Pt only able to see shadows. Able to perform all ADLs, clean, cook etc IND. Calls [employer]/Ly[employer] for transportation. ?  ? ? ?Hand Dominance  ?   ? ?  ?Extremity/Trunk Assessment  ? Upper Extremity Assessment ?Upper Extremity Assessment: Defer to OT evaluation (MMT scores of 5 grossly bil, symmetrical; denied numbness/tingling bil) ?  ? ?Lower Extremity Assessment ?Lower Extremity Assessment: Generalized weakness (noted functionally; MMT scores of 5 grossly bil though, symmetrical; denied numbness/tingling bil) ?  ? ?Cervical / Trunk Assessment ?Cervical / Trunk Assessment: Normal  ?Communication  ? Communication: No difficulties  ?Cognition Arousal/Alertness: Awake/alert ?Behavior During Therapy: Restless, Impulsive (emotional) ?Overall Cognitive Status: Impaired/Different from baseline ?Area of Impairment: Orientation, Attention, Memory, Safety/judgement, Following commands, Awareness, Problem solving ?  ?  ?  ?  ?  ?  ?  ?  ?  Orientation Level: Disoriented to, Situation, Place (unsure if oriented to time, but not likely) ?Current Attention Level:  Sustained ?Memory: Decreased short-term memory ?Following Commands: Follows one step commands inconsistently, Follows one step commands with increased time ?Safety/Judgement: Decreased awareness of safety, Decreased awareness of defic

## 2021-05-16 NOTE — Progress Notes (Signed)
eLink Physician-Brief Progress Note ?Patient Name: Beth Barnes ?DOB: [birth year] ?MRN: [id number] ? ? ?Date of Service ? 05/16/2021  ?HPI/Events of Note ? Agitation, hallucinating and climbing out of bed. Difficult to reorient ?No IV access  ?eICU Interventions ? IM haldol PRN. Last QTC at 8pm per RN ?Recheck QTC with EKG in am  ? ? ? ?Intervention Category ?Minor Interventions: Agitation / anxiety - evaluation and management ? ?Markiya Keefe Mechele Collin ?05/16/2021, 11:33 PM ?

## 2021-05-16 NOTE — Progress Notes (Signed)
VAST IV team attempted X3 sticks for PIV placement unsuccessfully. Has vein potentially for midline. Per MD's note earlier today, Keppra was to be made oral rather than IV. Discussed with primary RN, who will reach out to get route changed to oral, and let patient rest for tonight, will follow for possible midline placement tomorrow 4/8.  ?

## 2021-05-16 NOTE — Progress Notes (Signed)
Subjective: ?Extubated yesterday. Now off all sedation, answering questions and following commands.  ? ?Objective: ?Current vital signs: ?BP (!) 128/105   Pulse 81   Temp (!) 97 ?F (36.1 ?C) (Axillary)   Resp (!) 30   Ht 5\' 6"  (1.676 m)   Wt 64.8 kg   SpO2 93%   BMI 23.06 kg/m?  ?Vital signs in last 24 hours: ?Temp:  [96.1 ?F (35.6 ?C)-98.8 ?F (37.1 ?C)] 97 ?F (36.1 ?C) (04/07 0400) ?Pulse Rate:  [65-111] 81 (04/07 0713) ?Resp:  [14-30] 30 (04/07 0713) ?BP: (93-137)/(57-111) 128/105 (04/07 0700) ?SpO2:  [88 %-100 %] 93 % (04/07 0713) ?Arterial Line BP: (99-155)/(63-100) 143/96 (04/07 0713) ?FiO2 (%):  [40 %] 40 % (04/06 1054) ?Weight:  [64.8 kg] 64.8 kg (04/07 0400) ? ?Intake/Output from previous day: ?04/06 0701 - 04/07 0700 ?In: 819.8 [I.V.:314.4; IV Piggyback:505.4] ?Out: 1080 [Urine:1080] ?Intake/Output this shift: ?No intake/output data recorded. ?Nutritional status:  ?Diet Order   ? ?       ?  Diet NPO time specified  Diet effective now       ?  ? ?  ?  ? ?  ? ?HEENT: [place]/AT. Right eye prosthesis noted.  ?Lungs: Respirations unlabored ?Ext: No edema ?  ?Neurologic Exam:  ?Ment: Initially asleep, she awakens to voice. Able to follow commands and answer simple questions. Partially oriented to time and place.   ?CN: Right eye prosthesis. Left eye with dense corneal opacity. Opens eyes to command with roving EOM noted on the left. Face with improved tone compared to yesterday and Wednesday. Smile is symmetric. Phonation intact.  ?Motor/Sensory: Follows motor commands and resists examiner with all 4 extremities without asymmetry. Reacts to touch x 4.  ?Cerebellar: No ataxia when asked to touch nose with index finger bilaterally.  ?Gait: Unable to assess ? ?Lab Results: ?Results for orders placed or performed during the hospital encounter of 05/13/21 (from the past 48 hour(s))  ?Glucose, capillary     Status: Abnormal  ? Collection Time: 05/14/21  8:10 AM  ?Result Value Ref Range  ? Glucose-Capillary 113 (H)  70 - 99 mg/dL  ?  Comment: Glucose reference range applies only to samples taken after fasting for at least 8 hours.  ?I-STAT 7, (LYTES, BLD GAS, ICA, H+H)     Status: Abnormal  ? Collection Time: 05/14/21  8:18 AM  ?Result Value Ref Range  ? pH, Arterial 7.484 (H) 7.35 - 7.45  ? pCO2 arterial 34.4 32 - 48 mmHg  ? pO2, Arterial 219 (H) 83 - 108 mmHg  ? Bicarbonate 26.0 20.0 - 28.0 mmol/L  ? TCO2 27 22 - 32 mmol/L  ? O2 Saturation 100 %  ? Acid-Base Excess 3.0 (H) 0.0 - 2.0 mmol/L  ? Sodium 131 (L) 135 - 145 mmol/L  ? Potassium 3.8 3.5 - 5.1 mmol/L  ? Calcium, Ion 1.09 (L) 1.15 - 1.40 mmol/L  ? HCT 40.0 36.0 - 46.0 %  ? Hemoglobin 13.6 12.0 - 15.0 g/dL  ? Patient temperature 36.3 C   ? Collection site RADIAL, ALLEN'S TEST ACCEPTABLE   ? Drawn by RT   ? Sample type ARTERIAL   ?Glucose, capillary     Status: Abnormal  ? Collection Time: 05/14/21 11:38 AM  ?Result Value Ref Range  ? Glucose-Capillary 139 (H) 70 - 99 mg/dL  ?  Comment: Glucose reference range applies only to samples taken after fasting for at least 8 hours.  ?Comprehensive metabolic panel     Status:  Abnormal  ? Collection Time: 05/14/21  2:31 PM  ?Result Value Ref Range  ? Sodium 132 (L) 135 - 145 mmol/L  ? Potassium 3.5 3.5 - 5.1 mmol/L  ? Chloride 97 (L) 98 - 111 mmol/L  ? CO2 25 22 - 32 mmol/L  ? Glucose, Bld 197 (H) 70 - 99 mg/dL  ?  Comment: Glucose reference range applies only to samples taken after fasting for at least 8 hours.  ? BUN 10 6 - 20 mg/dL  ? Creatinine, Ser 1.08 (H) 0.44 - 1.00 mg/dL  ? Calcium 8.0 (L) 8.9 - 10.3 mg/dL  ? Total Protein 5.6 (L) 6.5 - 8.1 g/dL  ? Albumin 2.7 (L) 3.5 - 5.0 g/dL  ? AST 211 (H) 15 - 41 U/L  ? ALT 110 (H) 0 - 44 U/L  ? Alkaline Phosphatase 56 38 - 126 U/L  ? Total Bilirubin 0.6 0.3 - 1.2 mg/dL  ? GFR, Estimated >60 >60 mL/min  ?  Comment: (NOTE) ?Calculated using the CKD-EPI Creatinine Equation (2021) ?  ? Anion gap 10 5 - 15  ?  Comment: Performed at [facility], [address] [place], [place]  [address]  ?Glucose, capillary     Status: Abnormal  ? Collection Time: 05/14/21  4:12 PM  ?Result Value Ref Range  ? Glucose-Capillary 152 (H) 70 - 99 mg/dL  ?  Comment: Glucose reference range applies only to samples taken after fasting for at least 8 hours.  ? Comment 1 Notify RN   ?Glucose, capillary     Status: Abnormal  ? Collection Time: 05/14/21  8:05 PM  ?Result Value Ref Range  ? Glucose-Capillary 122 (H) 70 - 99 mg/dL  ?  Comment: Glucose reference range applies only to samples taken after fasting for at least 8 hours.  ?Glucose, capillary     Status: Abnormal  ? Collection Time: 05/15/21 12:01 AM  ?Result Value Ref Range  ? Glucose-Capillary 110 (H) 70 - 99 mg/dL  ?  Comment: Glucose reference range applies only to samples taken after fasting for at least 8 hours.  ?CBC     Status: Abnormal  ? Collection Time: 05/15/21  2:15 AM  ?Result Value Ref Range  ? WBC 10.4 4.0 - 10.5 K/uL  ? RBC 4.22 3.87 - 5.11 MIL/uL  ? Hemoglobin 12.7 12.0 - 15.0 g/dL  ? HCT 37.8 36.0 - 46.0 %  ? MCV 89.6 80.0 - 100.0 fL  ? MCH 30.1 26.0 - 34.0 pg  ? MCHC 33.6 30.0 - 36.0 g/dL  ? RDW 20.4 (H) 11.5 - 15.5 %  ? Platelets 187 150 - 400 K/uL  ? nRBC 0.4 (H) 0.0 - 0.2 %  ?  Comment: Performed at [facility], [address] [place], [place] [address]  ?Basic metabolic panel     Status: Abnormal  ? Collection Time: 05/15/21  2:15 AM  ?Result Value Ref Range  ? Sodium 135 135 - 145 mmol/L  ? Potassium 2.8 (L) 3.5 - 5.1 mmol/L  ?  Comment: DELTA CHECK NOTED  ? Chloride 106 98 - 111 mmol/L  ? CO2 21 (L) 22 - 32 mmol/L  ? Glucose, Bld 86 70 - 99 mg/dL  ?  Comment: Glucose reference range applies only to samples taken after fasting for at least 8 hours.  ? BUN 6 6 - 20 mg/dL  ? Creatinine, Ser 0.68 0.44 - 1.00 mg/dL  ? Calcium 7.8 (L) 8.9 - 10.3 mg/dL  ?  GFR, Estimated >60 >60 mL/min  ?  Comment: (NOTE) ?Calculated using the CKD-EPI Creatinine Equation (2021) ?  ? Anion gap 8 5 - 15  ?  Comment: Performed at [facility],  [address], [place], [place] [address]  ?Magnesium     Status: None  ? Collection Time: 05/15/21  2:15 AM  ?Result Value Ref Range  ? Magnesium 2.2 1.7 - 2.4 mg/dL  ?  Comment: Performed at [facility], [address] [place], [place] [address]  ?Phosphorus     Status: None  ? Collection Time: 05/15/21  2:15 AM  ?Result Value Ref Range  ? Phosphorus 3.0 2.5 - 4.6 mg/dL  ?  Comment: Performed at [facility], [address], [place], [place] [address]  ?Glucose, capillary     Status: None  ? Collection Time: 05/15/21  5:59 AM  ?Result Value Ref Range  ? Glucose-Capillary 81 70 - 99 mg/dL  ?  Comment: Glucose reference range applies only to samples taken after fasting for at least 8 hours.  ?Glucose, capillary     Status: Abnormal  ? Collection Time: 05/15/21  7:51 AM  ?Result Value Ref Range  ? Glucose-Capillary 55 (L) 70 - 99 mg/dL  ?  Comment: Glucose reference range applies only to samples taken after fasting for at least 8 hours.  ?Glucose, capillary     Status: Abnormal  ? Collection Time: 05/15/21  7:53 AM  ?Result Value Ref Range  ? Glucose-Capillary 54 (L) 70 - 99 mg/dL  ?  Comment: Glucose reference range applies only to samples taken after fasting for at least 8 hours.  ?Glucose, capillary     Status: Abnormal  ? Collection Time: 05/15/21  8:30 AM  ?Result Value Ref Range  ? Glucose-Capillary 143 (H) 70 - 99 mg/dL  ?  Comment: Glucose reference range applies only to samples taken after fasting for at least 8 hours.  ?Magnesium     Status: None  ? Collection Time: 05/15/21 10:53 AM  ?Result Value Ref Range  ? Magnesium 2.1 1.7 - 2.4 mg/dL  ?  Comment: Performed at [facility], [address] [place], [place] [address]  ?Phosphorus     Status: Abnormal  ? Collection Time: 05/15/21 10:53 AM  ?Result Value Ref Range  ? Phosphorus 2.3 (L) 2.5 - 4.6 mg/dL  ?  Comment: Performed at [facility], [address], [place], [place] [address]  ?Glucose, capillary     Status: None  ?  Collection Time: 05/15/21 11:28 AM  ?Result Value Ref Range  ? Glucose-Capillary 79 70 - 99 mg/dL  ?  Comment: Glucose reference range applies only to samples taken after fasting for at least 8 hours.  ?Glucose, capil

## 2021-05-16 NOTE — Progress Notes (Addendum)
4/7 1052 Pt growing increasingly agitated, unable to be reoriented; attempted multiple supportive interventions. Asked eLink MD to camera in for evaluation. It is possible that she is withdrawing from alcohol.  ? ?4/8 0100 IM Haldol given, pt now resting comfortably in bed; she is refusing VS monitoring. Will try to reattach telemetry monitor later today.  ?

## 2021-05-17 LAB — BASIC METABOLIC PANEL
Anion gap: 9 (ref 5–15)
BUN: <5 mg/dL — ABNORMAL LOW (ref 6–20)
CO2: 21 mmol/L — ABNORMAL LOW (ref 22–32)
Calcium: 8.7 mg/dL — ABNORMAL LOW (ref 8.9–10.3)
Chloride: 108 mmol/L (ref 98–111)
Creatinine, Ser: 0.64 mg/dL (ref 0.44–1.00)
GFR, Estimated: >60 mL/min (ref >60)
Glucose, Bld: 90 mg/dL (ref 70–99)
Potassium: 3.6 mmol/L (ref 3.5–5.1)
Sodium: 138 mmol/L (ref 135–145)

## 2021-05-17 LAB — GLUCOSE, CAPILLARY
Glucose-Capillary: 40 mg/dL — CL (ref 70–99)
Glucose-Capillary: 80 mg/dL (ref 70–99)
Glucose-Capillary: 83 mg/dL (ref 70–99)
Glucose-Capillary: 87 mg/dL (ref 70–99)
Glucose-Capillary: 89 mg/dL (ref 70–99)
Glucose-Capillary: 95 mg/dL (ref 70–99)

## 2021-05-17 MED ORDER — LEVETIRACETAM 500 MG PO TABS
1500.0000 mg | ORAL_TABLET | Freq: Two times a day (BID) | ORAL | Status: DC
Start: 2021-05-17 — End: 2021-05-20
  Administered 2021-05-17 – 2021-05-20 (×7): 1500 mg via ORAL
  Filled 2021-05-17: qty 3
  Filled 2021-05-17 (×2): qty 6
  Filled 2021-05-17 (×3): qty 3
  Filled 2021-05-17: qty 6
  Filled 2021-05-17: qty 3

## 2021-05-17 MED ORDER — POTASSIUM CHLORIDE 20 MEQ PO PACK
40.0000 meq | PACK | Freq: Once | ORAL | Status: AC
  Administered 2021-05-17: 40 meq
  Filled 2021-05-17: qty 2

## 2021-05-17 MED ORDER — LOPERAMIDE HCL 2 MG PO CAPS
2.0000 mg | ORAL_CAPSULE | ORAL | Status: DC | PRN
Start: 2021-05-17 — End: 2021-05-20
  Administered 2021-05-17 (×2): 2 mg via ORAL
  Filled 2021-05-17 (×2): qty 1

## 2021-05-17 MED ORDER — POTASSIUM CHLORIDE 20 MEQ PO PACK: 40.0000 meq | PACK | Freq: Once | ORAL | Status: DC

## 2021-05-17 NOTE — Progress Notes (Addendum)
Spoke with Andrey Campanile at bedside re PIV.   No IV meds ordered at this time other than prn.  2 VasTeam  RN's assesses overnight.  RN aware to notify if med needs change to place IV consult at that time. No IV placed at this time "Just in case". ?

## 2021-05-17 NOTE — Progress Notes (Signed)
?Cardiology Progress Note  ?Patient ID: Catharine Wimer ?MRN: [id number] ?DOB: 05-03-1995 ?Date of Encounter: 05/17/2021 ? ?Primary Cardiologist: None ? ?Subjective  ? ?Chief Complaint: None. Wants to leave  ? ?HPI: Reports she wants to leave.  Appears confused is why she is in the hospital.  Per nursing concern for possible alcohol withdrawal.  Hemodynamically stable. ? ?ROS:  ?All other ROS reviewed and negative. Pertinent positives noted in the HPI.    ? ?Inpatient Medications  ?Scheduled Meds: ? carvedilol  6.25 mg Per Tube BID WC  ? chlorhexidine gluconate (MEDLINE KIT)  15 mL Mouth Rinse BID  ? Chlorhexidine Gluconate Cloth  6 each Topical Daily  ? folic acid  1 mg Per Tube Daily  ? levETIRAcetam  1,500 mg Oral BID  ? pneumococcal 20-valent conjugate vaccine  0.5 mL Intramuscular Tomorrow-1000  ? potassium chloride  40 mEq Oral BID  ? spironolactone  25 mg Per Tube Daily  ? thiamine  500 mg Per Tube Daily  ? ?Continuous Infusions: ? ?PRN Meds: ?docusate, LORazepam, polyethylene glycol  ? ?Vital Signs  ? ?Vitals:  ? 05/16/21 2000 05/16/21 2100 05/16/21 2200 05/17/21 0426  ?BP:   (!) 131/107 (!) 128/98  ?Pulse:      ?Resp: (!) 27 (!) 29 (!) 25 20  ?Temp:    98 ?F (36.7 ?C)  ?TempSrc:    Oral  ?SpO2:   99% 99%  ?Weight:    65.4 kg  ?Height:      ? ? ?Intake/Output Summary (Last 24 hours) at 05/17/2021 0804 ?Last data filed at 05/17/2021 0500 ?Gross per 24 hour  ?Intake 180 ml  ?Output --  ?Net 180 ml  ? ? ?  05/17/2021  ?  4:26 AM 05/16/2021  ?  4:00 AM 05/14/2021  ?  5:00 AM  ?Last 3 Weights  ?Weight (lbs) 144 lb 2.9 oz 142 lb 13.7 oz 143 lb 4.8 oz  ?Weight (kg) 65.4 kg 64.8 kg 65 kg  ?   ? ?Telemetry  ?Overnight telemetry shows sinus tachycardia heart rate in the 120s, which I personally reviewed.  ? ?Physical Exam  ? ?Vitals:  ? 05/16/21 2000 05/16/21 2100 05/16/21 2200 05/17/21 0426  ?BP:   (!) 131/107 (!) 128/98  ?Pulse:      ?Resp: (!) 27 (!) 29 (!) 25 20  ?Temp:    98 ?F (36.7 ?C)  ?TempSrc:    Oral  ?SpO2:   99% 99%   ?Weight:    65.4 kg  ?Height:      ?  ?Intake/Output Summary (Last 24 hours) at 05/17/2021 0804 ?Last data filed at 05/17/2021 0500 ?Gross per 24 hour  ?Intake 180 ml  ?Output --  ?Net 180 ml  ?  ? ?  05/17/2021  ?  4:26 AM 05/16/2021  ?  4:00 AM 05/14/2021  ?  5:00 AM  ?Last 3 Weights  ?Weight (lbs) 144 lb 2.9 oz 142 lb 13.7 oz 143 lb 4.8 oz  ?Weight (kg) 65.4 kg 64.8 kg 65 kg  ?  Body mass index is 23.27 kg/m?.  ?General: Well nourished, well developed, in no acute distress ?Head: Atraumatic, normal size  ?Eyes: PEERLA, EOMI  ?Neck: Supple, no JVD ?Endocrine: No thryomegaly ?Cardiac: Normal S1, S2; tachycardia, no murmurs ?Lungs: Clear to auscultation bilaterally, no wheezing, rhonchi or rales  ?Abd: Soft, nontender, no hepatomegaly  ?Ext: No edema, pulses 2+ ?Musculoskeletal: No deformities, BUE and BLE strength normal and equal ?Skin: Warm and dry, no rashes   ?  Neuro: Awake, alert, confused is why she is in the hospital ? ?Labs  ?High Sensitivity Troponin:   ?Recent Labs  ?Lab 05/13/21 ?1740 05/13/21 ?1849 05/14/21 ?0106  ?TROPONINIHS 280* 1,223* 3,101*  ?   ?Cardiac EnzymesNo results for input(s): TROPONINI in the last 168 hours. No results for input(s): TROPIPOC in the last 168 hours.  ?Chemistry ?Recent Labs  ?Lab 05/14/21 ?1431 05/15/21 ?0215 05/16/21 ?0409 05/16/21 ?1702  ?NA 132* 135 141 142  ?K 3.5 2.8* 2.8* 4.0  ?CL 97* 106 112* 110  ?CO2 25 21* 24 20*  ?GLUCOSE 197* 86 79 85  ?BUN 10 6 5* <5*  ?CREATININE 1.08* 0.68 0.53 0.67  ?CALCIUM 8.0* 7.8* 8.1* 9.1  ?PROT 5.6*  --   --   --   ?ALBUMIN 2.7*  --   --   --   ?AST 211*  --   --   --   ?ALT 110*  --   --   --   ?ALKPHOS 56  --   --   --   ?BILITOT 0.6  --   --   --   ?GFRNONAA >60 >60 >60 >60  ?ANIONGAP 10 8 5 12  ?  ?Hematology ?Recent Labs  ?Lab 05/14/21 ?0443 05/14/21 ?0818 05/15/21 ?0215 05/16/21 ?0409  ?WBC 19.0*  19.0*  --  10.4 6.7  ?RBC 4.27  4.27  --  4.22 3.28*  ?HGB 12.8  12.8 13.6 12.7 9.7*  ?HCT 38.1  38.1 40.0 37.8 31.0*  ?MCV 89.2  89.2  --   89.6 94.5  ?MCH 30.0  30.0  --  30.1 29.6  ?MCHC 33.6  33.6  --  33.6 31.3  ?RDW 20.5*  20.5*  --  20.4* 19.9*  ?PLT 250  250  --  187 146*  ? ?BNPNo results for input(s): BNP, PROBNP in the last 168 hours.  ?DDimer No results for input(s): DDIMER in the last 168 hours.  ? ?Radiology  ?Overnight EEG with video ? ?Result Date: 05/15/2021 ?Yadav, Priyanka O, MD     05/15/2021  1:56 PM Patient Name: Jyl Bonnette MRN: [id number] Epilepsy Attending: Priyanka O Yadav Referring Physician/Provider: Smith, Daniel C, MD Duration: 05/14/2021 0930 to 05/15/2021 0930  Patient history: 25 year old woman with hx of blindness, polysubstance abuse who was visiting boyfriend in [place] and found unresponsive, unknown downtime. Now with myoclonus. EEG to evaluate for seizure  Level of alertness:  comatose  AEDs during EEG study: propofol, versed, LEV  Technical aspects: This EEG study was done with scalp electrodes positioned according to the 10-20 International system of electrode placement. Electrical activity was acquired at a sampling rate of 500Hz and reviewed with a high frequency filter of 70Hz and a low frequency filter of 1Hz. EEG data were recorded continuously and digitally stored.  Description: At the beginning of study, EEG showed continuous generalized polymorphic mixed frequencies with predominantly 3 to 7 Hz theta-delta slowing admixed with intermittent generalized 15 to 18 Hz beta activity. Gradually, EEG improved and showed predominantly 8-9hz generalized alpha activity. Hyperventilation and photic stimulation were not performed.    ABNORMALITY - Continuous slow, generalized  IMPRESSION: This study was initially suggestive of severe diffuse encephalopathy, nonspecific etiology. Gradually, EEG improved and was suggestive of mild to moderate diffuse encephalopathy, likely due to sedation.  No seizures or epileptiform discharges were seen throughout the recording.  Priyanka O Yadav   ? ?Cardiac Studies  ?TTE 05/14/2021 ? ? 1. Left  ventricular ejection fraction, by estimation,   is <20%. The left  ?ventricle has severely decreased function. The left ventricle demonstrates  ?global hypokinesis. Left ventricular diastolic parameters are consistent  ?with Grade II diastolic  ?dysfunction (pseudonormalization).  ? 2. Right ventricular systolic function is moderately reduced. The right  ?ventricular size is normal.  ? 3. The mitral valve is normal in structure. No evidence of mitral valve  ?regurgitation.  ? 4. The aortic valve is tricuspid. Aortic valve regurgitation is not  ?visualized. No aortic stenosis is present.  ? ?Patient Profile  ?Hilja Largo is a 26 y.o. female with polysubstance abuse who was admitted on 05/13/2021 with VF arrest.  Found to have newly diagnosed systolic heart failure.  Significant alcohol abuse is documented in the chart. ? ?Assessment & Plan  ? ?#VF arrest ?-EF less than 20%.  Suspect this is a primary cardiac arrest in the setting of systolic heart failure.  Etiology likely alcohol abuse. ?-Also was hypokalemic and had QTc prolongation.  This could have triggered it as well in the setting of low potassium. ?-Appears to be in alcohol withdrawal.  Per notes she is using alcohol regularly. ?-She is not a candidate for ICD in the setting of substance abuse. ?-I will discuss her case with the EP.  They should weigh in formally. ? 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 ?-She is quite confused and possibly going through alcohol withdrawal. ?-Currently euvolemic on exam. ?-On Coreg 6.25 mg twice daily.  Aldactone 25 mg daily. ?-We need to add Entresto but I am reluctant to do this given possible concerns for alcohol withdrawal.  For now we will continue with therapy and see how she does.  She wants to leave and is quite confused.  I will discuss her case with critical care medicine. ?-No need for Lasix as she is euvolemic on  exam. ? ?#Hypokalemia ?-Resolved.  Now on Aldactone. ? ?#ETOH withdrawal  ?-CIWA   ? ?For questions or updates, please contact [place] ?Please consult [email or web address] for contact info under  ? ?Signed, ?[place] T. Audie Box, MD, [facility] ?[place]

## 2021-05-17 NOTE — Progress Notes (Signed)
Spoke with primary RN regarding IV consult for possible midline placement. Patient not receiving IV medications or fluids at this time. RN to speak with attending MD and report back. ?

## 2021-05-17 NOTE — Progress Notes (Signed)
Inpatient Rehab Admissions Coordinator:  ? ?I spoke with Pt. And family regarding potential CIR admit. They are interested and her mother is to provide 24/7 assist to her at d/c, I will follow for potential admit pending medical readiness and bed availability.  ? ?Megan Salon, MS, CCC-SLP ?Rehab Admissions Coordinator  ?[phone] (celll) ?[phone] (office) ? ?

## 2021-05-17 NOTE — Progress Notes (Signed)
eLink Physician-Brief Progress Note ?Patient Name: Beth Barnes ?DOB: Jun 06, 1995 ?MRN: [id number] ? ? ?Date of Service ? 05/17/2021  ?HPI/Events of Note ? Im being made aware that IV team attempted line without success. Request to renew sitter as patient pulling lines. Seen restless.  ?eICU Interventions ? Sitter renewed ?Informed bedside CCM team  ? ? ? ?Intervention Category ?Intermediate Interventions: Other: ?Minor Interventions: Agitation / anxiety - evaluation and management ? ?Judd Lien ?05/17/2021, 10:11 PM ?

## 2021-05-17 NOTE — Progress Notes (Signed)
Hypoglycemic Event ? ?CBG: 40 ? ?Treatment: 8 oz juice/soda ? ?Symptoms: None ? ?Follow-up CBG: Time:1651 CBG Result:80 ? ?Possible Reasons for Event: Inadequate meal intake ? ?Comments/MD notified:yes ? ? ? ?Beth Barnes ? ? ?

## 2021-05-17 NOTE — Progress Notes (Signed)
? ?NAME:  Beth Barnes, MRN:  [id number], DOB:  Sep 18, 1995, LOS: 4 ?ADMISSION DATE:  05/13/2021, CONSULTATION DATE:  05/13/21 ?REFERRING MD:  N/A, CHIEF COMPLAINT:  Cardiac arrest  ? ?History of Present Illness:  ?26 year old woman with hx of glaucoma and blindness, polysubstance abuse who was visiting boyfriend in [place] and found unresponsive, unknown downtime.  CPR begun immediately (see separate note).  CRNA intubated.  Initial rhythm vfib.  ROSC obtained and patient direct admitted to ICU.  History obtained from boyfriend. She left briefly and then came back.  History of cocaine/etoh/MJ abuse.  He denies she has any IVDA or opiate use.  All her family is in [place]. ? ?Family arrived and state that patient has a history of heavy binge-drinking, had recently been trying to cut back recently, along with recent ER visits for generally not feeling well and had been told her potassium was low ? ?Pertinent  Medical History  ? ?Past Medical History:  ?Diagnosis Date  ? Blind   ? Congenital glaucoma of both eyes   ? Hypokalemia   ? ?Significant Hospital Events: ?Including procedures, antibiotic start and stop dates in addition to other pertinent events   ?4/4 cardiac arrest ~10 min downtime ?4/5 Transferred to [facility] for LTM EEG, intubated on Levophed ?4/5 EEG>This study is suggestive of severe diffuse encephalopathy, nonspecific etiology.  No seizures or epileptiform discharges were seen throughout the recording. ?Echo 4/5>1. Left ventricular ejection fraction, by estimation, is <20%. The left  ?ventricle has severely decreased function. The left ventricle demonstrates  ?global hypokinesis. Left ventricular diastolic parameters are consistent  ?with Grade II diastolic  ?dysfunction (pseudonormalization).  ? 2. Right ventricular systolic function is moderately reduced. The right  ?ventricular size is normal.  ?4/4 CTH>no acute findings ?4/6 more awake today - Extubated.  ?4/8 periods of agitation.  ? ?Interim History / Subjective:   ?Extubated yesterday, following commands but remains confused at times.  Will get up and walk around and is redirectable but will also get very agitated.  ? ?Objective   ?Blood pressure (!) 117/95, pulse 100, temperature 98.6 ?F (37 ?C), temperature source Oral, resp. rate 20, height 5\' 6"  (1.676 m), weight 65.4 kg, SpO2 99 %. ?   ?   ? ?Intake/Output Summary (Last 24 hours) at 05/17/2021 0842 ?Last data filed at 05/17/2021 0500 ?Gross per 24 hour  ?Intake 180 ml  ?Output --  ?Net 180 ml  ? ? ?Filed Weights  ? 05/14/21 0500 05/16/21 0400 05/17/21 0426  ?Weight: 65 kg 64.8 kg 65.4 kg  ? ?General:  awake and will walk around.  ?HEENT: MM pink/moist, R eye prosthesis and L cornea opacified ?Neuro: no focal deficits. Able to perform ADL's but impulsive and has no insight into condition. Agitation at times but redirectable.  No tremors ?CV: s1s2 rrr, no m/r/g, no edema or JVD ?PULM:  chest clear bilaterally.  ?GI: soft, bsx4 active  ?Extremities: warm/dry, no edema  ?Skin: no rashes or lesions ? ?POC echo shows mild improvement in EF  ? ?Ancillary tests personally reviewed:  ?CT head 4/4 normal. ?Hypokalemia.  ? ?Assessment & Plan:  ? ?Witnessed in Hospital Vfib cardiac arrest likely secondary to severe cardiomyopathy, prolonged Qtc and hypokalemia  ?Subsequent acute hypoxic respiratory failure and cardiogenic shock  ?Hx of ETOH abuse ?Reported cocaine and marijuana abuse ?Post arrest encephalopathy ?Persistent hypokalemia.  ? ?Plan:  ? ?- Currently showing signs of frontal brain injury from arrest. Do not think she is having significant  withdrawal. ?- Start clonazepam and seroquel at low doses to help stabilize periods of agitation ?- Needs frequent redirection - would benefit from 1:1 sitter.  ?- Continue to optimize HF therapy: continue spironolactone, titrate carvedilol to 25 bid if tolerated.  ?- Needs stable contraception prior to starting RAS inhibition.  ?- Hypokalemia has corrected.  ? ?Best Practice (right click  and "Reselect all SmartList Selections" daily)  ? ?Diet/type: oral diet ?DVT prophylaxis: lovenox. ?GI prophylaxis: none ?Lines: PIV only ?Foley:  voiding spontaneously ?Code Status:  full code ?Last date of multidisciplinary goals of care discussion [Pt's mother updated at the bedside with guarded prognosis, continue full code 4/5] ? ?Lynnell Catalan, MD FRCPC ?ICU Physician ?[facility]  ?Pager: [phone] ?Mobile: [phone] ?After hours: [phone].  ? ? ?

## 2021-05-18 DIAGNOSIS — H5410 Blindness, one eye, low vision other eye, unspecified eyes: Secondary | ICD-10-CM

## 2021-05-18 DIAGNOSIS — I426 Alcoholic cardiomyopathy: Secondary | ICD-10-CM

## 2021-05-18 LAB — CULTURE, BLOOD (ROUTINE X 2)
Culture: NO GROWTH
Culture: NO GROWTH
Special Requests: ADEQUATE
Special Requests: ADEQUATE

## 2021-05-18 LAB — BASIC METABOLIC PANEL
Anion gap: 12 (ref 5–15)
BUN: <5 mg/dL — ABNORMAL LOW (ref 6–20)
CO2: 19 mmol/L — ABNORMAL LOW (ref 22–32)
Calcium: 9.4 mg/dL (ref 8.9–10.3)
Chloride: 104 mmol/L (ref 98–111)
Creatinine, Ser: 0.53 mg/dL (ref 0.44–1.00)
GFR, Estimated: >60 mL/min (ref >60)
Glucose, Bld: 86 mg/dL (ref 70–99)
Potassium: 4.7 mmol/L (ref 3.5–5.1)
Sodium: 135 mmol/L (ref 135–145)

## 2021-05-18 LAB — CBC
HCT: 41.3 % (ref 36.0–46.0)
Hemoglobin: 12.7 g/dL (ref 12.0–15.0)
MCH: 29.4 pg (ref 26.0–34.0)
MCHC: 30.8 g/dL (ref 30.0–36.0)
MCV: 95.6 fL (ref 80.0–100.0)
Platelets: 272 10*3/uL (ref 150–400)
RBC: 4.32 MIL/uL (ref 3.87–5.11)
RDW: 19.1 % — ABNORMAL HIGH (ref 11.5–15.5)
WBC: 7.4 10*3/uL (ref 4.0–10.5)
nRBC: 0 % (ref 0.0–0.2)

## 2021-05-18 LAB — GLUCOSE, CAPILLARY
Glucose-Capillary: 76 mg/dL (ref 70–99)
Glucose-Capillary: 87 mg/dL (ref 70–99)
Glucose-Capillary: 103 mg/dL — ABNORMAL HIGH (ref 70–99)
Glucose-Capillary: 74 mg/dL (ref 70–99)
Glucose-Capillary: 73 mg/dL (ref 70–99)

## 2021-05-18 LAB — MAGNESIUM: Magnesium: 1.8 mg/dL (ref 1.7–2.4)

## 2021-05-18 MED ORDER — SACUBITRIL-VALSARTAN 24-26 MG PO TABS
1.0000 | ORAL_TABLET | Freq: Two times a day (BID) | ORAL | Status: DC
  Administered 2021-05-18 – 2021-05-20 (×5): 1 via ORAL
  Filled 2021-05-18 (×7): qty 1

## 2021-05-18 NOTE — Progress Notes (Signed)
?Cardiology Progress Note  ?Patient ID: Beth Barnes ?MRN: [id number] ?DOB: 29-Jul-1995 ?Date of Encounter: 05/18/2021 ? ?Primary Cardiologist: None ? ?Subjective  ? ?Chief Complaint: none.  ? ?HPI: Confused as to where she is.  Alert to self and time.  Also alert to situation.  Boyfriend at bedside informs me she drinks at least 1/5 of a gallon of liquor daily. ? ?ROS:  ?All other ROS reviewed and negative. Pertinent positives noted in the HPI.    ? ?Inpatient Medications  ?Scheduled Meds: ? carvedilol  6.25 mg Per Tube BID WC  ? chlorhexidine gluconate (MEDLINE KIT)  15 mL Mouth Rinse BID  ? Chlorhexidine Gluconate Cloth  6 each Topical Daily  ? folic acid  1 mg Per Tube Daily  ? levETIRAcetam  1,500 mg Oral BID  ? pneumococcal 20-valent conjugate vaccine  0.5 mL Intramuscular Tomorrow-1000  ? sacubitril-valsartan  1 tablet Oral BID  ? spironolactone  25 mg Per Tube Daily  ? thiamine  500 mg Per Tube Daily  ? ?Continuous Infusions: ? ?PRN Meds: ?docusate, loperamide, LORazepam, polyethylene glycol  ? ?Vital Signs  ? ?Vitals:  ? 05/18/21 0508 05/18/21 0600 05/18/21 0630 05/18/21 0700  ?BP: 123/87 108/73  (!) 123/94  ?Pulse:  90  85  ?Resp:  16    ?Temp:      ?TempSrc:      ?SpO2:  98%  100%  ?Weight:   64.1 kg   ?Height:      ? ? ?Intake/Output Summary (Last 24 hours) at 05/18/2021 0744 ?Last data filed at 05/18/2021 0500 ?Gross per 24 hour  ?Intake 1140 ml  ?Output --  ?Net 1140 ml  ? ? ?  05/18/2021  ?  6:30 AM 05/17/2021  ?  4:26 AM 05/16/2021  ?  4:00 AM  ?Last 3 Weights  ?Weight (lbs) 141 lb 5 oz 144 lb 2.9 oz 142 lb 13.7 oz  ?Weight (kg) 64.1 kg 65.4 kg 64.8 kg  ?   ? ?Telemetry  ?Overnight telemetry shows SR 90s, which I personally reviewed.  ? ?Physical Exam  ? ?Vitals:  ? 05/18/21 0508 05/18/21 0600 05/18/21 0630 05/18/21 0700  ?BP: 123/87 108/73  (!) 123/94  ?Pulse:  90  85  ?Resp:  16    ?Temp:      ?TempSrc:      ?SpO2:  98%  100%  ?Weight:   64.1 kg   ?Height:      ?  ?Intake/Output Summary (Last 24 hours) at  05/18/2021 0744 ?Last data filed at 05/18/2021 0500 ?Gross per 24 hour  ?Intake 1140 ml  ?Output --  ?Net 1140 ml  ?  ? ?  05/18/2021  ?  6:30 AM 05/17/2021  ?  4:26 AM 05/16/2021  ?  4:00 AM  ?Last 3 Weights  ?Weight (lbs) 141 lb 5 oz 144 lb 2.9 oz 142 lb 13.7 oz  ?Weight (kg) 64.1 kg 65.4 kg 64.8 kg  ?  Body mass index is 22.81 kg/m?.  ?General: Well nourished, well developed, in no acute distress ?Head: Atraumatic, normal size  ?Eyes: PEERLA, EOMI  ?Neck: Supple, no JVD ?Endocrine: No thryomegaly ?Cardiac: Normal S1, S2; RRR; no murmurs, rubs, or gallops ?Lungs: Clear to auscultation bilaterally, no wheezing, rhonchi or rales  ?Abd: Soft, nontender, no hepatomegaly  ?Ext: No edema, pulses 2+ ?Musculoskeletal: No deformities, BUE and BLE strength normal and equal ?Skin: Warm and dry, no rashes   ?Neuro: Alert, awake, oriented to person time and situation ? ?Labs  ?  High Sensitivity Troponin:   ?Recent Labs  ?Lab 05/13/21 ?1740 05/13/21 ?1849 05/14/21 ?0106  ?TROPONINIHS 280* 1,223* 3,101*  ?   ?Cardiac EnzymesNo results for input(s): TROPONINI in the last 168 hours. No results for input(s): TROPIPOC in the last 168 hours.  ?Chemistry ?Recent Labs  ?Lab 05/14/21 ?1431 05/15/21 ?0215 05/16/21 ?0409 05/16/21 ?1702 05/17/21 ?1248  ?NA 132*   < > 141 142 138  ?K 3.5   < > 2.8* 4.0 3.6  ?CL 97*   < > 112* 110 108  ?CO2 25   < > 24 20* 21*  ?GLUCOSE 197*   < > 79 85 90  ?BUN 10   < > 5* <5* <5*  ?CREATININE 1.08*   < > 0.53 0.67 0.64  ?CALCIUM 8.0*   < > 8.1* 9.1 8.7*  ?PROT 5.6*  --   --   --   --   ?ALBUMIN 2.7*  --   --   --   --   ?AST 211*  --   --   --   --   ?ALT 110*  --   --   --   --   ?ALKPHOS 56  --   --   --   --   ?BILITOT 0.6  --   --   --   --   ?GFRNONAA >60   < > >60 >60 >60  ?ANIONGAP 10   < > 5 12 9   ? < > = values in this interval not displayed.  ?  ?Hematology ?Recent Labs  ?Lab 05/14/21 ?0443 05/14/21 ?07/14/21 05/15/21 ?0215 05/16/21 ?0409  ?WBC 19.0*  19.0*  --  10.4 6.7  ?RBC 4.27  4.27  --  4.22 3.28*  ?HGB  12.8  12.8 13.6 12.7 9.7*  ?HCT 38.1  38.1 40.0 37.8 31.0*  ?MCV 89.2  89.2  --  89.6 94.5  ?MCH 30.0  30.0  --  30.1 29.6  ?MCHC 33.6  33.6  --  33.6 31.3  ?RDW 20.5*  20.5*  --  20.4* 19.9*  ?PLT 250  250  --  187 146*  ? ?BNPNo results for input(s): BNP, PROBNP in the last 168 hours.  ?DDimer No results for input(s): DDIMER in the last 168 hours.  ? ?Radiology  ?No results found. ? ?Cardiac Studies  ?TTE 05/14/2021 ? 1. Left ventricular ejection fraction, by estimation, is <20%. The left  ?ventricle has severely decreased function. The left ventricle demonstrates  ?global hypokinesis. Left ventricular diastolic parameters are consistent  ?with Grade II diastolic  ?dysfunction (pseudonormalization).  ? 2. Right ventricular systolic function is moderately reduced. The right  ?ventricular size is normal.  ? 3. The mitral valve is normal in structure. No evidence of mitral valve  ?regurgitation.  ? 4. The aortic valve is tricuspid. Aortic valve regurgitation is not  ?visualized. No aortic stenosis is present.  ? ?Patient Profile  ?Beth Barnes is a 26 y.o. female with polysubstance abuse who was admitted on 05/13/2021 with VF arrest.  Found to have newly diagnosed systolic heart failure.  Significant alcohol abuse is documented in the chart. ? ?Assessment & Plan  ? 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  Boyfriend confirms she drinks at least 1/5 of liquor daily likely more. ?-EKG also was consistent with hypokalemia with QTc prolongation.  Could have triggered this as well. ?-Given ongoing alcohol abuse and alcohol withdrawal symptoms not a candidate for ICD. ?-We will have EP see her tomorrow. ? ?#  New onset systolic heart failure, EF less than 20% ?#Alcohol induced cardiomyopathy ?-Significant alcohol abuse reported per the boyfriend today. ?-Suspect this is the etiology. ?-We will continue with optimization of medical therapy while she has been treated for alcohol  withdrawal. ?-Continue Coreg 6.25 mg twice daily, Aldactone 25 mg daily.  We will add Entresto 24-26 mg twice daily. ?-Can add SGLT2 inhibitor in the next few days. ?-Euvolemic on exam. ? ?#Hypokalemia ?-Resolved ? ?#Alcohol withdrawal ?-CIWA ? ?For questions or updates, please contact [place] ?Please consult [email or web address] for contact info under  ? ?Signed, ?[place] T. Audie Box, MD, [facility] ?[place]  ?05/18/2021 7:44 AM  ? ?

## 2021-05-18 NOTE — Progress Notes (Signed)
? ?NAME:  Beth Barnes, MRN:  [id number], DOB:  [birth year], LOS: 5 ?ADMISSION DATE:  05/13/2021, CONSULTATION DATE:  05/13/21 ?REFERRING MD:  N/A, CHIEF COMPLAINT:  Cardiac arrest  ? ?History of Present Illness:  ?26 year old woman with hx of glaucoma and blindness, polysubstance abuse who was visiting boyfriend in [place] and found unresponsive, unknown downtime.  CPR begun immediately (see separate note).  CRNA intubated.  Initial rhythm vfib.  ROSC obtained and patient direct admitted to ICU.  History obtained from boyfriend. She left briefly and then came back.  History of cocaine/etoh/MJ abuse.  He denies she has any IVDA or opiate use.  All her family is in [place]. ? ?Family arrived and state that patient has a history of heavy binge-drinking, had recently been trying to cut back recently, along with recent ER visits for generally not feeling well and had been told her potassium was low ? ?Pertinent  Medical History  ? ?Past Medical History:  ?Diagnosis Date  ? Blind   ? Congenital glaucoma of both eyes   ? Hypokalemia   ? ?Significant Hospital Events: ?Including procedures, antibiotic start and stop dates in addition to other pertinent events   ?4/4 cardiac arrest ~10 min downtime ?4/5 Transferred to [facility] for LTM EEG, intubated on Levophed ?4/5 EEG>This study is suggestive of severe diffuse encephalopathy, nonspecific etiology.  No seizures or epileptiform discharges were seen throughout the recording. ?Echo 4/5>1. Left ventricular ejection fraction, by estimation, is <20%. The left  ?ventricle has severely decreased function. The left ventricle demonstrates  ?global hypokinesis. Left ventricular diastolic parameters are consistent  ?with Grade II diastolic  ?dysfunction (pseudonormalization).  ? 2. Right ventricular systolic function is moderately reduced. The right  ?ventricular size is normal.  ?4/4 CTH>no acute findings ?4/6 more awake today - Extubated.  ?4/8 periods of agitation.  ?4/6 calmer today.  ? ?Interim  History / Subjective:  ?Calmer, not requiring Precedex. Still periods of confusion.  ? ?Objective   ?Blood pressure (!) 128/92, pulse 84, temperature 98.2 ?F (36.8 ?C), temperature source Oral, resp. rate 20, height 5\' 6"  (1.676 m), weight 64.1 kg, SpO2 98 %. ?   ?   ? ?Intake/Output Summary (Last 24 hours) at 05/18/2021 1236 ?Last data filed at 05/18/2021 0500 ?Gross per 24 hour  ?Intake 420 ml  ?Output --  ?Net 420 ml  ? ? ?Filed Weights  ? 05/16/21 0400 05/17/21 0426 05/18/21 0630  ?Weight: 64.8 kg 65.4 kg 64.1 kg  ? ?General:  awake and will walk around.  ?HEENT: MM pink/moist, R eye prosthesis and L cornea opacified ?Neuro: no focal deficits. Able to perform ADL's but impulsive and has little insight into condition.  ?CV: s1s2 rrr, no m/r/g, no edema or JVD ?PULM:  chest clear bilaterally.  ?GI: soft, bsx4 active  ?Extremities: warm/dry, no edema  ?Skin: no rashes or lesions ? ?POC echo shows mild improvement in EF  ? ?Ancillary tests personally reviewed:  ? ? ?Assessment & Plan:  ? ?Witnessed in Hospital Vfib cardiac arrest likely secondary to severe cardiomyopathy, prolonged Qtc and hypokalemia  ?Subsequent acute hypoxic respiratory failure and cardiogenic shock  ?Hx of ETOH abuse ?Reported cocaine and marijuana abuse ?Post arrest encephalopathy ?Persistent hypokalemia.  ? ?Plan:  ? ?- Currently showing signs of frontal brain injury from arrest. Do not think she is having significant withdrawal. ?- Start clonazepam and seroquel at low doses to help stabilize periods of agitation ?- Needs less redirection - appropriate for floor. ?- Continue  to optimize HF therapy: continue spironolactone, titrate carvedilol to 25 bid if tolerated. Entresto added ?- Needs stable contraception - GYN will arrange for outpatient consultation.  ?- Patient and mother advised regarding risk of impulsive behavior, need for complete alcohol abstinence and the risk of birth defects with HF medications and the need for proper contraception.   ? ?Transfer today. Orders reconciled and TRH notified.  ? ?Best Practice (right click and "Reselect all SmartList Selections" daily)  ? ?Diet/type: oral diet ?DVT prophylaxis: lovenox. ?GI prophylaxis: none ?Lines: PIV only ?Foley:  voiding spontaneously ?Code Status:  full code ?Last date of multidisciplinary goals of care discussion 4/9 ? ? ?Lynnell Catalan, MD FRCPC ?ICU Physician ?[facility]  ?Pager: [phone] ?Mobile: [phone] ?After hours: [phone].  ? ? ?

## 2021-05-19 ENCOUNTER — Inpatient Hospital Stay (HOSPITAL_COMMUNITY): Payer: Medicaid Other

## 2021-05-19 ENCOUNTER — Encounter (HOSPITAL_COMMUNITY): Payer: Self-pay | Admitting: Internal Medicine

## 2021-05-19 ENCOUNTER — Other Ambulatory Visit (HOSPITAL_COMMUNITY): Payer: Self-pay

## 2021-05-19 DIAGNOSIS — F1010 Alcohol abuse, uncomplicated: Secondary | ICD-10-CM

## 2021-05-19 DIAGNOSIS — I469 Cardiac arrest, cause unspecified: Secondary | ICD-10-CM

## 2021-05-19 LAB — GLUCOSE, CAPILLARY
Glucose-Capillary: 108 mg/dL — ABNORMAL HIGH (ref 70–99)
Glucose-Capillary: 63 mg/dL — ABNORMAL LOW (ref 70–99)
Glucose-Capillary: 74 mg/dL (ref 70–99)
Glucose-Capillary: 80 mg/dL (ref 70–99)
Glucose-Capillary: 82 mg/dL (ref 70–99)
Glucose-Capillary: 84 mg/dL (ref 70–99)
Glucose-Capillary: 87 mg/dL (ref 70–99)
Glucose-Capillary: 90 mg/dL (ref 70–99)

## 2021-05-19 LAB — CBC
HCT: 35.6 % — ABNORMAL LOW (ref 36.0–46.0)
Hemoglobin: 11.4 g/dL — ABNORMAL LOW (ref 12.0–15.0)
MCH: 30.1 pg (ref 26.0–34.0)
MCHC: 32 g/dL (ref 30.0–36.0)
MCV: 93.9 fL (ref 80.0–100.0)
Platelets: 306 10*3/uL (ref 150–400)
RBC: 3.79 MIL/uL — ABNORMAL LOW (ref 3.87–5.11)
RDW: 19.3 % — ABNORMAL HIGH (ref 11.5–15.5)
WBC: 5.6 10*3/uL (ref 4.0–10.5)
nRBC: 0 % (ref 0.0–0.2)

## 2021-05-19 LAB — BASIC METABOLIC PANEL
Anion gap: 5 (ref 5–15)
BUN: <5 mg/dL — ABNORMAL LOW (ref 6–20)
CO2: 26 mmol/L (ref 22–32)
Calcium: 8.9 mg/dL (ref 8.9–10.3)
Chloride: 104 mmol/L (ref 98–111)
Creatinine, Ser: 0.56 mg/dL (ref 0.44–1.00)
GFR, Estimated: >60 mL/min (ref >60)
Glucose, Bld: 98 mg/dL (ref 70–99)
Potassium: 3.2 mmol/L — ABNORMAL LOW (ref 3.5–5.1)
Sodium: 135 mmol/L (ref 135–145)

## 2021-05-19 LAB — NA AND K (SODIUM & POTASSIUM), RAND UR
Potassium Urine: 12 mmol/L
Sodium, Ur: 43 mmol/L

## 2021-05-19 LAB — ECHOCARDIOGRAM LIMITED
Calc EF: 38.8 %
Height: 66 in
S' Lateral: 3.25 cm
Single Plane A2C EF: 37.3 %
Single Plane A4C EF: 40.7 %
Weight: 2259.2 oz

## 2021-05-19 LAB — PREGNANCY, URINE: Preg Test, Ur: NEGATIVE

## 2021-05-19 MED ORDER — POTASSIUM CHLORIDE CRYS ER 20 MEQ PO TBCR
40.0000 meq | EXTENDED_RELEASE_TABLET | Freq: Once | ORAL | Status: AC
  Administered 2021-05-19: 40 meq via ORAL
  Filled 2021-05-19: qty 2

## 2021-05-19 MED ORDER — POTASSIUM CHLORIDE CRYS ER 20 MEQ PO TBCR
40.0000 meq | EXTENDED_RELEASE_TABLET | Freq: Once | ORAL | Status: AC
Start: 2021-05-19 — End: 2021-05-19
  Administered 2021-05-19: 40 meq via ORAL
  Filled 2021-05-19: qty 2

## 2021-05-19 MED ORDER — PERFLUTREN LIPID MICROSPHERE
1.0000 mL | INTRAVENOUS | Status: AC | PRN
  Administered 2021-05-19: 2 mL via INTRAVENOUS
  Filled 2021-05-19: qty 10

## 2021-05-19 MED ORDER — TRAMADOL HCL 50 MG PO TABS: 50.0000 mg | ORAL_TABLET | Freq: Two times a day (BID) | ORAL | Status: DC | PRN

## 2021-05-19 MED ORDER — ACETAMINOPHEN 325 MG PO TABS
650.0000 mg | ORAL_TABLET | Freq: Four times a day (QID) | ORAL | Status: DC | PRN
  Administered 2021-05-19 – 2021-05-20 (×2): 650 mg via ORAL
  Filled 2021-05-19 (×2): qty 2

## 2021-05-19 MED ORDER — DAPAGLIFLOZIN PROPANEDIOL 10 MG PO TABS
10.0000 mg | ORAL_TABLET | Freq: Every day | ORAL | Status: DC
  Administered 2021-05-19 – 2021-05-20 (×2): 10 mg via ORAL
  Filled 2021-05-19 (×2): qty 1

## 2021-05-19 NOTE — Progress Notes (Signed)
IV team consult placed for PIV. Per conversation w/ IV team, multiple attempts made by IV team have been unsuccessful. Will update dayshift RN during bedside handoff at 7am. ?

## 2021-05-19 NOTE — Progress Notes (Signed)
Heart Failure Nurse Navigator Progress Note ? ?PCP: Patient, No Pcp Per (Inactive) ?PCP-Cardiologist: NA ?Admission Diagnosis: Cardiac Arrest ?Admitted from: Home ? ?Presentation:   ?Tanji Muhlenkamp presented with V- Fib arrest after visiting with her boyfriend and found unresponsive. CPR was performed and Narcan given, upon resuscitation patient was transferred to ICU. Patient has a substance abuse history and per boyfriend she left his room briefly and returned prior to the event.  ? ?During interview, patients mom was present and obtained verbal permission from patient for mom to be included in interview. Patient stated she doesn't remember all that happened that day, she is alert and oriented x 3. Stated she drinks about 1/5 of vodka most days, has used marijuana, uses edibles and smokes a couple cigarettes a few days a week. She lives with her boyfriend who pays the bills and she has no financial issues with getting her medications. She uses services like lift/uber and other services for her transportation needs.  ? ?We spoke in detail about her diet and fluid restrictions moving forward, patient stated she eats out a lot and drinks a bunch of soda. Education was done on daily weights and the importance of watching/ feeling for signs and symptoms of fluid overload and when to call her doctor. Education done on the importance of taking all her medications as prescribed and making all her follow up and future doctor appointments.Mom stated that after she is discharged and does her follow up appointments she is planning on moving her daughter to [place] where she is currently living. Patient was set up with a hospital follow up appointment at [facility] on 05/26/21 @ 2pm. Mom stated she will be driving her for this appointment. Patient voiced she understood the education.  ? ? ?ECHO/ LVEF: 20% ? ?Clinical Course: ? ?Past Medical History:  ?Diagnosis Date  ? Blind   ? Congenital glaucoma of both eyes   ? Hypokalemia   ?   ? ?Social History  ? ?Socioeconomic History  ? Marital status: Significant Other  ?  Spouse name: Not on file  ? Number of children: 0  ? Years of education: Not on file  ? Highest education level: High school graduate  ?Occupational History  ?  Comment: NO  ?Tobacco Use  ? Smoking status: Some Days  ?  Packs/day: 0.50  ?  Years: 5.00  ?  Pack years: 2.50  ?  Types: Cigarettes  ? Smokeless tobacco: Never  ? Tobacco comments:  ?  Smoking cessation  ?Vaping Use  ? Vaping Use: Some days  ? Substances: Nicotine, THC, Flavoring, Mixture of cannabinoids  ?Substance and Sexual Activity  ? Alcohol use: Yes  ?  Alcohol/week: 3.0 standard drinks  ?  Types: 3 Shots of liquor per week  ?  Comment: every day  ? Drug use: Not Currently  ?  Types: Marijuana  ?  Comment: January  ? Sexual activity: Not on file  ?Other Topics Concern  ? Not on file  ?Social History Narrative  ? Not on file  ? ?Social Determinants of Health  ? ?Financial Resource Strain: Low Risk   ? Difficulty of Paying Living Expenses: Not very hard  ?Food Insecurity: No Food Insecurity  ? Worried About Programme researcher, broadcasting/film/video in the Last Year: Never true  ? Ran Out of Food in the Last Year: Never true  ?Transportation Needs: No Transportation Needs  ? Lack of Transportation (Medical): No  ? Lack of Transportation (Non-Medical): No  ?  Physical Activity: Not on file  ?Stress: Not on file  ?Social Connections: Not on file  ? ?Education Assessment and Provision: ? ?Detailed education and instructions provided on heart failure disease management including the following: ? ?Signs and symptoms of Heart Failure ?When to call the physician ?Importance of daily weights ?Low sodium diet ?Fluid restriction ?Medication management ?Anticipated future follow-up appointments ? ?Patient education given on each of the above topics.  Patient acknowledges understanding via teach back method and acceptance of all instructions. ? ?Education Materials:  "Living Better With Heart Failure"  Booklet, HF zone tool, & Daily Weight Tracker Tool. ? ?Patient has scale at home: yes ?Patient has pill box at home: NA   ?High Risk Criteria for Readmission and/or Poor Patient Outcomes: ?Heart failure hospital admissions (last 6 months): 0  ?No Show rate: 0 ?Difficult social situation: No ?Demonstrates medication adherence:  ?Primary Language: English ?Literacy level: Reading, writing and comprehension. Patient is Blind.  ? ?Barriers of Care:   ?Patient is Blind, Substance abuse history.  ? ?Considerations/Referrals:  ? ?Referral made to Heart Failure Pharmacist Stewardship: Yes ?Referral made to Heart Failure CSW/NCM TOC: No ?Referral made to Heart & Vascular TOC clinic: Yes, hospital follow up appointment with HF TOC 05/26/21 @ 2pm.  ? ?Items for Follow-up on DC/TOC: ?Optimize medications ?Smoking cessation ?Substance abuse counseling ? ? ?Rhae Hammock, BSN, RN ?Heart Failure Nurse Navigator ?Secure Chat Only   ?

## 2021-05-19 NOTE — Progress Notes (Signed)
Inpatient Rehab Admissions Coordinator:  ? ?I do not have a CIR bed for this pt. Today. I will submit case to her insurance once PT has seen her and entered a note.  ? ?Clemens Catholic, MS, CCC-SLP ?Rehab Admissions Coordinator  ?[phone] (celll) ?[phone] (office) ? ?

## 2021-05-19 NOTE — Progress Notes (Signed)
?Cardiology Progress Note  ?Patient ID: Beth Barnes ?MRN: [id number] ?DOB: 1995/06/13 ?Date of Encounter: 05/19/2021 ? ?Primary Cardiologist: None ? ?Subjective  ? ?Chief Complaint: None.  ? ?HPI: Denies chest pain or trouble breathing.  EP consult today.  Tolerating heart failure medications well. ? ?ROS:  ?All other ROS reviewed and negative. Pertinent positives noted in the HPI.    ? ?Inpatient Medications  ?Scheduled Meds: ? carvedilol  6.25 mg Per Tube BID WC  ? dapagliflozin propanediol  10 mg Oral Daily  ? folic acid  1 mg Per Tube Daily  ? levETIRAcetam  1,500 mg Oral BID  ? pneumococcal 20-valent conjugate vaccine  0.5 mL Intramuscular Tomorrow-1000  ? sacubitril-valsartan  1 tablet Oral BID  ? spironolactone  25 mg Per Tube Daily  ? ?Continuous Infusions: ? ?PRN Meds: ?docusate, loperamide, LORazepam, polyethylene glycol  ? ?Vital Signs  ? ?Vitals:  ? 05/19/21 0026 05/19/21 2878 05/19/21 0802 05/19/21 0905  ?BP: (!) 119/95 (!) 131/96 (!) 137/97 (!) 117/91  ?Pulse: 80 76 83 84  ?Resp: 18 17 18    ?Temp: 97.9 ?F (36.6 ?C) 97.9 ?F (36.6 ?C) 98.3 ?F (36.8 ?C)   ?TempSrc: Oral Oral Oral   ?SpO2: 100% 96% 100%   ?Weight:  64 kg    ?Height:      ? ? ?Intake/Output Summary (Last 24 hours) at 05/19/2021 0924 ?Last data filed at 05/18/2021 1000 ?Gross per 24 hour  ?Intake 240 ml  ?Output --  ?Net 240 ml  ? ? ?  05/19/2021  ?  4:38 AM 05/18/2021  ?  6:30 AM 05/17/2021  ?  4:26 AM  ?Last 3 Weights  ?Weight (lbs) 141 lb 3.2 oz 141 lb 5 oz 144 lb 2.9 oz  ?Weight (kg) 64.048 kg 64.1 kg 65.4 kg  ?   ? ?Telemetry  ?Overnight telemetry shows sinus rhythm in the 80s, which I personally reviewed.  ? ?Physical Exam  ? ?Vitals:  ? 05/19/21 0026 05/19/21 07/19/21 05/19/21 0802 05/19/21 0905  ?BP: (!) 119/95 (!) 131/96 (!) 137/97 (!) 117/91  ?Pulse: 80 76 83 84  ?Resp: 18 17 18    ?Temp: 97.9 ?F (36.6 ?C) 97.9 ?F (36.6 ?C) 98.3 ?F (36.8 ?C)   ?TempSrc: Oral Oral Oral   ?SpO2: 100% 96% 100%   ?Weight:  64 kg    ?Height:      ?  ?Intake/Output  Summary (Last 24 hours) at 05/19/2021 0924 ?Last data filed at 05/18/2021 1000 ?Gross per 24 hour  ?Intake 240 ml  ?Output --  ?Net 240 ml  ?  ? ?  05/19/2021  ?  4:38 AM 05/18/2021  ?  6:30 AM 05/17/2021  ?  4:26 AM  ?Last 3 Weights  ?Weight (lbs) 141 lb 3.2 oz 141 lb 5 oz 144 lb 2.9 oz  ?Weight (kg) 64.048 kg 64.1 kg 65.4 kg  ?  Body mass index is 22.79 kg/m?.  ?General: Well nourished, well developed, in no acute distress ?Head: Atraumatic, normal size  ?Eyes: PEERLA, EOMI  ?Neck: Supple, no JVD ?Endocrine: No thryomegaly ?Cardiac: Normal S1, S2; RRR; no murmurs, rubs, or gallops ?Lungs: Clear to auscultation bilaterally, no wheezing, rhonchi or rales  ?Abd: Soft, nontender, no hepatomegaly  ?Ext: No edema, pulses 2+ ?Musculoskeletal: No deformities, BUE and BLE strength normal and equal ?Skin: Warm and dry, no rashes   ?Neuro: Alert and oriented to person, place, time, and situation, CNII-XII grossly intact, no focal deficits  ?Psych: Normal mood and affect  ? ?  Labs  ?High Sensitivity Troponin:   ?Recent Labs  ?Lab 05/13/21 ?1740 05/13/21 ?1849 05/14/21 ?0106  ?TROPONINIHS 280* 1,223* 3,101*  ?   ?Cardiac EnzymesNo results for input(s): TROPONINI in the last 168 hours. No results for input(s): TROPIPOC in the last 168 hours.  ?Chemistry ?Recent Labs  ?Lab 05/14/21 ?1431 05/15/21 ?0215 05/16/21 ?1702 05/17/21 ?1248 05/18/21 ?1632  ?NA 132*   < > 142 138 135  ?K 3.5   < > 4.0 3.6 4.7  ?CL 97*   < > 110 108 104  ?CO2 25   < > 20* 21* 19*  ?GLUCOSE 197*   < > 85 90 86  ?BUN 10   < > <5* <5* <5*  ?CREATININE 1.08*   < > 0.67 0.64 0.53  ?CALCIUM 8.0*   < > 9.1 8.7* 9.4  ?PROT 5.6*  --   --   --   --   ?ALBUMIN 2.7*  --   --   --   --   ?AST 211*  --   --   --   --   ?ALT 110*  --   --   --   --   ?ALKPHOS 56  --   --   --   --   ?BILITOT 0.6  --   --   --   --   ?GFRNONAA >60   < > >60 >60 >60  ?ANIONGAP 10   < > 12 9 12   ? < > = values in this interval not displayed.  ?  ?Hematology ?Recent Labs  ?Lab 05/15/21 ?0215  05/16/21 ?0409 05/18/21 ?1632  ?WBC 10.4 6.7 7.4  ?RBC 4.22 3.28* 4.32  ?HGB 12.7 9.7* 12.7  ?HCT 37.8 31.0* 41.3  ?MCV 89.6 94.5 95.6  ?MCH 30.1 29.6 29.4  ?MCHC 33.6 31.3 30.8  ?RDW 20.4* 19.9* 19.1*  ?PLT 187 146* 272  ? ?BNPNo results for input(s): BNP, PROBNP in the last 168 hours.  ?DDimer No results for input(s): DDIMER in the last 168 hours.  ? ?Radiology  ?No results found. ? ?Cardiac Studies  ?TTE 05/14/2021 ? 1. Left ventricular ejection fraction, by estimation, is <20%. The left  ?ventricle has severely decreased function. The left ventricle demonstrates  ?global hypokinesis. Left ventricular diastolic parameters are consistent  ?with Grade II diastolic  ?dysfunction (pseudonormalization).  ? 2. Right ventricular systolic function is moderately reduced. The right  ?ventricular size is normal.  ? 3. The mitral valve is normal in structure. No evidence of mitral valve  ?regurgitation.  ? 4. The aortic valve is tricuspid. Aortic valve regurgitation is not  ?visualized. No aortic stenosis is present.  ? ?Patient Profile  ?Beth Barnes is a 26 y.o. female with polysubstance abuse who was admitted on 05/13/2021 with VF arrest.  Found to have newly diagnosed systolic heart failure.  Significant alcohol abuse is documented in the chart. ? ?Assessment & Plan  ? ?#VF arrest ?-Suspect secondary to alcohol induced cardiomyopathy.  Was drinking over 1/5 of liquor daily. ?-EP evaluation today but I suspect she is not a candidate for ICD. ?-Given ongoing alcohol abuse would recommend conservative approach. ? ?#New onset systolic heart failure, EF less than 20% ?#Alcohol induced cardiomyopathy ?-Suspect her heart failure is related to alcohol consumption. ?-She was drinking heavily. ?-We will continue with medical therapy.  Coreg 6.25 mg twice daily, Aldactone 25 mg daily, Entresto 24 to 26 mg twice daily.  Add Jardiance 10 mg daily. ?-Euvolemic on exam. ?-We discussed that  she should pursue oral contraception.  She should  refrain from pregnancy at this point.  Pregnancy test is negative.  She understands the risk.  I explained this to her mother as well. ? ?#Alcohol withdrawal ?-CIWA protocol ?-May consider inpatient rehab. ? ?For questions or updates, please contact [facility] ?Please consult [email or web address] for contact info under  ?   ?Signed, ?Gerri Spore T. Flora Lipps, MD, [facility] ?[place]  [facility]  ?05/19/2021 9:24 AM  ? ?

## 2021-05-19 NOTE — Progress Notes (Signed)
Physical Therapy Treatment ?Patient Details ?Name: Beth Barnes ?MRN: [id number] ?DOB: 1995/11/10 ?Today's Date: 05/19/2021 ? ? ?History of Present Illness Pt is a 26 y.o. female who presented 05/13/21 while visiting boyfriend in hospital and was found to be unresponsive with an unknown downtime with V-fib cardiac arrest. Pt received CPR for ~10 min before ROSC. Postcardiac arrest, pt exhibiting whole body myoclonic jerking but stat rapid EEG negative for seizures. CT of head 4/4 negative for acute intracranial abnormalities. Urinary toxicology screen positive only for benzodiazepines. ETT 4/4 - 4/6. Awaiting brain MRI. 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 ?  ?PT Comments  ? ? The pt was seen for continued progression of OOB mobility and balance training. She was able to demo significant improvement in capacity for sit-stand transfers and hallway ambulation without knees buckling, but does continue to need up to minA to steady and guide with mobility. The pt has greatest deficits in stability with narrow BOS, and has very poor memory and awareness of deficits. Will benefit from continued skilled PT to progress pt ability to dual task (cog + physical) as well as to improve safety awareness to facilitate return to full independence.  ?   ?Recommendations for follow up therapy are one component of a multi-disciplinary discharge planning process, led by the attending physician.  Recommendations may be updated based on patient status, additional functional criteria and insurance authorization. ? ?Follow Up Recommendations ? Acute inpatient rehab (3hours/day) ?  ?  ?Assistance Recommended at Discharge Frequent or constant Supervision/Assistance  ?Patient can return home with the following A lot of help with walking and/or transfers;A lot of help with bathing/dressing/bathroom;Assistance with cooking/housework;Direct supervision/assist for medications management;Direct  supervision/assist for financial management;Assist for transportation;Help with stairs or ramp for entrance ?  ?Equipment Recommendations ? None recommended by PT  ?  ?Recommendations for Other Services   ? ? ?  ?Precautions / Restrictions Precautions ?Precautions: Fall;Other (comment) ?Precaution Comments: watch HR; impulsive/restless ?Restrictions ?Weight Bearing Restrictions: No  ?  ? ?Mobility ? Bed Mobility ?Overal bed mobility: Needs Assistance ?  ?  ?  ?  ?  ?  ?General bed mobility comments: pt long sitting in bed upno arrival, able to transition to sitting EOB ?  ? ?Transfers ?Overall transfer level: Needs assistance ?Equipment used: 1 person hand held assist ?  ?Sit to Stand: Min guard ?  ?  ?  ?  ?  ?General transfer comment: minG for safety, mild instability with initial stand but pt able to recover without assist ?  ? ?Ambulation/Gait ?Ambulation/Gait assistance: Min assist ?Gait Distance (Feet): 45 Feet (+15 ft + 15 ft) ?Assistive device: 1 person hand held assist ?Gait Pattern/deviations: Step-through pattern, Decreased stride length, Shuffle, Scissoring, Narrow base of support ?Gait velocity: reduced ?  ?  ?General Gait Details: pt with small steps and narrow BOS, intermittently needing minA to steady as well as constant minA to direct (due to baseline blindness). pt able to complete changes in direction and speed with minA, challenged by prolonged SLS ? ? ? ? ?  ?Balance Overall balance assessment: Needs assistance ?Sitting-balance support: Feet supported, Bilateral upper extremity supported, Single extremity supported ?Sitting balance-Leahy Scale: Good ?Sitting balance - Comments: can sit EOB and reach outside BOS without LOB ?  ?Standing balance support: During functional activity ?Standing balance-Leahy Scale: Fair ?Standing balance comment: UE support for gait, especially with balance challenge ?  ?  ?Tandem Stance - Right Leg: 0 ?Tandem Stance -  Left Leg: 1 ?  ?Rhomberg - Eyes Closed: 8 ?High  level balance activites: Backward walking, Direction changes, Turns, Sudden stops ?High Level Balance Comments: up to minA ?  ?  ?  ?  ? ?  ?Cognition Arousal/Alertness: Awake/alert ?Behavior During Therapy: Restless ?Overall Cognitive Status: Impaired/Different from baseline ?Area of Impairment: Orientation, Attention, Memory, Safety/judgement, Following commands, Awareness, Problem solving ?  ?  ?  ?  ?  ?  ?  ?  ?Orientation Level: Time, Disoriented to, Situation ?Current Attention Level: Sustained ?Memory: Decreased short-term memory ?Following Commands: Follows one step commands with increased time ?Safety/Judgement: Decreased awareness of safety, Decreased awareness of deficits ?Awareness: Intellectual ?Problem Solving: Slow processing, Decreased initiation, Difficulty sequencing, Requires verbal cues, Requires tactile cues ?General Comments: pt with poor awareness to deficits, stating she showered this morning when she had not, mother present and correcting pt's statements. ?  ?  ? ?  ?Exercises   ? ?  ?General Comments General comments (skin integrity, edema, etc.): mother present and supportive, VSS ?  ?  ? ?Pertinent Vitals/Pain Pain Assessment ?Pain Assessment: No/denies pain  ? ? ? ?PT Goals (current goals can now be found in the care plan section) Acute Rehab PT Goals ?Patient Stated Goal: to not be "going crazy" ?PT Goal Formulation: With patient/family ?Time For Goal Achievement: 05/30/21 ?Potential to Achieve Goals: Good ?Progress towards PT goals: Progressing toward goals ? ?  ?Frequency ? ? ? Min 3X/week ? ? ? ?  ?PT Plan Current plan remains appropriate  ? ? ?   ?AM-PAC PT "6 Clicks" Mobility   ?Outcome Measure ? Help needed turning from your back to your side while in a flat bed without using bedrails?: A Little ?Help needed moving from lying on your back to sitting on the side of a flat bed without using bedrails?: A Little ?Help needed moving to and from a bed to a chair (including a  wheelchair)?: A Little ?Help needed standing up from a chair using your arms (e.g., wheelchair or bedside chair)?: A Little ?Help needed to walk in hospital room?: A Lot (mod cues) ?Help needed climbing 3-5 steps with a railing? : Total ?6 Click Score: 15 ? ?  ?End of Session Equipment Utilized During Treatment: Gait belt ?Activity Tolerance: Patient tolerated treatment well ?Patient left: with family/visitor present;in bed;with call bell/phone within reach;with bed alarm set ?Nurse Communication: Mobility status ?PT Visit Diagnosis: Unsteadiness on feet (R26.81);Other abnormalities of gait and mobility (R26.89);Muscle weakness (generalized) (M62.81);Difficulty in walking, not elsewhere classified (R26.2) ?  ? ? ?Time: 1610-9604 ?PT Time Calculation (min) (ACUTE ONLY): 19 min ? ?Charges:  $Therapeutic Exercise: 8-22 mins          ?          ? ?Vickki Muff, PT, DPT  ? ?Acute Rehabilitation Department ?Pager #: [phone] - [phone] ? ? ?Ronnie Derby ?05/19/2021, 2:26 PM ? ?

## 2021-05-19 NOTE — Evaluation (Signed)
Occupational Therapy Evaluation ?Patient Details ?Name: Beth Barnes ?MRN: [id number] ?DOB: [birth year] ?Today's Date: 05/19/2021 ? ? ?History of Present Illness Pt is a 26 y.o. female who presented 05/13/21 while visiting boyfriend in hospital and was found to be unresponsive with an unknown downtime with V-fib cardiac arrest. Pt received CPR for ~10 min before ROSC. Postcardiac arrest, pt exhibiting whole body myoclonic jerking but stat rapid EEG negative for seizures. CT of head 4/4 negative for acute intracranial abnormalities. Urinary toxicology screen positive only for benzodiazepines. ETT 4/4 - 4/6. Awaiting brain MRI. 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 ?Clinical Impression ?  ?Pt reports independence with mobility at baseline (uses cane), is independent with all ADLs, does not drive. Was living with significant other prior to hospital admission, plans to go to mother's home after hospitalization. Pt currently set up - min A for ADLs, supervision for bed mobility, and min guard for transfers with 1 person handheld assist. Pt requiring increased verbal (directional) and tactile cuing, as pt unfamiliar with room environment. Pt able to walk room distance x3 during session with 1 person handheld assistance. Pt reporting mild dizziness afterward. Pt A & O x3, thinking it is January, hesitant with responses. Able to recall events of therapy session, however mother reports pt's days/events have been running together and pt has been having difficulty distinguishing between dreams and actual events. Pt presenting with impairments listed below, will follow acutely. Recommend AIR at d/c. ?   ? ?Recommendations for follow up therapy are one component of a multi-disciplinary discharge planning process, led by the attending physician.  Recommendations may be updated based on patient status, additional functional criteria and insurance authorization.  ? ?Follow Up  Recommendations ? Acute inpatient rehab (3hours/day)  ?  ?Assistance Recommended at Discharge Intermittent Supervision/Assistance  ?Patient can return home with the following A lot of help with walking and/or transfers;A lot of help with bathing/dressing/bathroom;Help with stairs or ramp for entrance;Assistance with cooking/housework;Direct supervision/assist for medications management ? ?  ?Functional Status Assessment ? Patient has had a recent decline in their functional status and demonstrates the ability to make significant improvements in function in a reasonable and predictable amount of time.  ?Equipment Recommendations ? None recommended by OT;Other (comment) (defer to next venue of care)  ?  ?Recommendations for Other Services Rehab consult;PT consult ? ? ?  ?Precautions / Restrictions Precautions ?Precautions: Fall;Other (comment) ?Precaution Comments: watch HR; impulsive/restless ?Restrictions ?Weight Bearing Restrictions: No  ? ?  ? ?Mobility Bed Mobility ?Overal bed mobility: Needs Assistance ?Bed Mobility: Sit to Supine ?  ?  ?  ?Sit to supine: Supervision, HOB elevated ?  ?General bed mobility comments: increased time, but able to bring BLE's into bed ?  ? ?Transfers ?Overall transfer level: Needs assistance ?Equipment used: 1 person hand held assist ?  ?Sit to Stand: Min guard ?  ?  ?  ?  ?  ?  ?  ? ?  ?Balance Overall balance assessment: Needs assistance ?Sitting-balance support: Feet supported, Bilateral upper extremity supported, Single extremity supported ?Sitting balance-Leahy Scale: Good ?Sitting balance - Comments: can sit EOB and reach outside BOS without LOB ?  ?Standing balance support: During functional activity ?Standing balance-Leahy Scale: Fair ?Standing balance comment: minimal UE/external support with in-room ambulation ?  ?  ?  ?  ?  ?  ?  ?  ?  ?  ?  ?   ? ?ADL either performed or assessed with clinical  judgement  ? ?ADL Overall ADL's : Needs assistance/impaired ?Eating/Feeding:  Set up;Sitting ?  ?Grooming: Set up;Cueing for sequencing;Cueing for safety;Sitting;Oral care;Wash/dry face;Wash/dry hands ?Grooming Details (indicate cue type and reason): cues for placment of items on sink ?Upper Body Bathing: Set up;Sitting ?  ?Lower Body Bathing: Minimal assistance;Sitting/lateral leans ?  ?Upper Body Dressing : Minimal assistance;Sitting ?  ?Lower Body Dressing: Set up;Sitting/lateral leans ?Lower Body Dressing Details (indicate cue type and reason): to don socks ?Toilet Transfer: Min guard;Ambulation;Regular Toilet ?Toilet Transfer Details (indicate cue type and reason): simulated in room ?Toileting- Clothing Manipulation and Hygiene: Supervision/safety;Sitting/lateral lean ?  ?  ?  ?Functional mobility during ADLs: Min guard;Cueing for sequencing;Cueing for safety ?General ADL Comments: pt reports she does not have cane here in hospital, uses BUE to navigate spaces  ? ? ? ?Vision Baseline Vision/History: 2 Legally blind ?Ability to See in Adequate Light: 4 Severely impaired ?Patient Visual Report: No change from baseline;Other (comment) (pt able to only see shadows, reports seeing red light when windows are open/bright lights are on) ?Additional Comments: pt able to navigate spaces with use of hands and increased directional cues  ?   ?Perception   ?  ?Praxis   ?  ? ?Pertinent Vitals/Pain Pain Assessment ?Pain Assessment: No/denies pain  ? ? ? ?Hand Dominance   ?  ?Extremity/Trunk Assessment Upper Extremity Assessment ?Upper Extremity Assessment: Overall WFL for tasks assessed ?  ?Lower Extremity Assessment ?Lower Extremity Assessment: Defer to PT evaluation ?  ?Cervical / Trunk Assessment ?Cervical / Trunk Assessment: Normal ?  ?Communication Communication ?Communication: No difficulties ?  ?Cognition Arousal/Alertness: Awake/alert ?Behavior During Therapy: Restless ?Overall Cognitive Status: Impaired/Different from baseline ?Area of Impairment: Orientation, Attention, Memory,  Safety/judgement, Following commands, Awareness, Problem solving ?  ?  ?  ?  ?  ?  ?  ?  ?Orientation Level: Time, Disoriented to, Situation ?Current Attention Level: Sustained ?Memory: Decreased short-term memory ?Following Commands: Follows one step commands with increased time ?Safety/Judgement: Decreased awareness of safety, Decreased awareness of deficits ?Awareness: Intellectual ?Problem Solving: Slow processing, Decreased initiation, Difficulty sequencing, Requires verbal cues, Requires tactile cues ?General Comments: Pt thinking it is January, mother reports pt is having difficulty distinguishing between dreams/real events and time frames, reports that events have been "running together" ?  ?  ?General Comments  mother present during session, VSS on RA ? ?  ?Exercises   ?  ?Shoulder Instructions    ? ? ?Home Living Family/patient expects to be discharged to:: Private residence ?Living Arrangements: Parent (mother) ?Available Help at Discharge: Family;Available 24 hours/day ?Type of Home: House ?Home Access: Stairs to enter;Ramped entrance ?Entrance Stairs-Number of Steps: 3 ?Entrance Stairs-Rails: None ?Home Layout: One level ?  ?  ?Bathroom Shower/Tub: Walk-in shower ?  ?Bathroom Toilet: Handicapped height ?  ?  ?Home Equipment: Rolling Walker (2 wheels);BSC/3in1;Shower seat - built in;Grab bars - tub/shower;Hand held shower head;Adaptive equipment ?Adaptive Equipment: Reacher;Sock aid ?Additional Comments: info above for pt's mother's house as plan is to d/c home with mother instead of back to pt living with SO ?  ? ?  ?Prior Functioning/Environment Prior Level of Function : Independent/Modified Independent ?  ?  ?  ?  ?  ?  ?Mobility Comments: Pt would ambulate using her walking stick secondary to being blind. ?ADLs Comments: Pt only able to see shadows. Able to perform all ADLs, clean, cook etc IND. Calls Uber/Ly[employer] for transportation. ?  ? ?  ?  ?OT Problem List: Decreased strength;Decreased range of  motion;Decreased activity tolerance;Impaired balance (sitting and/or standing);Decreased safety awareness;Decreased cognition;Impaired vision/perception ?  ?   ?OT Treatment/Interventions: Self-care/ADL training;Therapeutic e

## 2021-05-19 NOTE — PMR Pre-admission (Signed)
PMR Admission Coordinator Pre-Admission Assessment ? ?Patient: Beth Barnes is an 26 y.o., female ?MRN: [id number] ?DOB: [birth year] ?Height: 5\' 6"  (167.6 cm) ?Weight: 64 kg ? ?Insurance Information ?HMO:   PPO:      PCP:      IPA:      80/20:      OTHER:  ?PRIMARY: [place] [employer]       Policy#: N, [id number]     Subscriber: Pt ?CM Name: [id number]      Phone#: [phone]     Fax#: [phone] ?Pre-Cert#: [id number]      Employer:  ?Benefits:  Phone #:      Name:  ?Eff. Date: 04/09/2021- still active     Deduct:       Out of Pocket Max:       Life Max:  ?CIR:       SNF:  ?Outpatient:      Co-Pay:  ?Home Health:       Co-Pay:  ?DME:      Co-Pay:  ?Providers:  ? ?SECONDARY:       Policy#:      Phone#:  ? ?Financial Counselor:       Phone#:  ? ?The ?Data Collection Information Summary? for patients in Inpatient Rehabilitation Facilities with attached ?Privacy Act Statement-Health Care Records? was provided and verbally reviewed with: Patient ? ?Emergency Contact Information ?Contact Information   ? ? Name Relation Home Work Mobile  ? Manson,Tara Mother   [phone]  ? Jones,Victoria Friend [phone]  [phone]  ? ?  ? ? ?Current Medical History  ?Patient Admitting Diagnosis: Cardiac arrest ?History of Present Illness: Pt. Is a 26 year old woman with hx of blindness, polysubstance abuse who was visiting boyfriend in 25 and found unresponsive, unknown downtime on 05/13/21. downtime.  CPR begun immediately (see separate note).  CRNA intubated.  Initial rhythm vfib.  ROSC obtained and patient direct admitted to ICU.  History obtained from boyfriend. She left briefly and then came back.  History of cocaine/etoh/MJ abuse.  He denies she has any IVDA or opiate use.Postcardiac arrest, pt exhibiting whole body myoclonic jerking but stat rapid EEG negative for seizures. CT of head 4/4 negative for acute intracranial abnormalities. Urinary toxicology screen positive only for benzodiazepines. ETT 4/4 - 4/6  PT/OT consulted and recommended CIR to assist return to PLOF.  ?  ? ?Patient's medical record from [facility] has been reviewed by the rehabilitation admission coordinator and physician. ? ?Past Medical History  ?Past Medical History:  ?Diagnosis Date  ? Blind   ? Congenital glaucoma of both eyes   ? Hypokalemia   ? ? ?Has the patient had major surgery during 100 days prior to admission? No ? ?Family History   ?family history includes Healthy in her father and mother. ? ?Current Medications ? ?Current Facility-Administered Medications:  ?  acetaminophen (TYLENOL) tablet 650 mg, 650 mg, Oral, Q6H PRN, [facility], MD, 650 mg at 05/19/21 0956 ?  carvedilol (COREG) tablet 6.25 mg, 6.25 mg, Per Tube, BID WC, Agarwala, Ravi, MD, 6.25 mg at 05/19/21 0905 ?  dapagliflozin propanediol (FARXIGA) tablet 10 mg, 10 mg, Oral, Daily, O'Neal, 07/19/21, MD, 10 mg at 05/19/21 0957 ?  docusate (COLACE) 50 MG/5ML liquid 100 mg, 100 mg, Per Tube, BID PRN, 07/19/21, MD ?  folic acid (FOLVITE) tablet 1 mg, 1 mg, Per Tube, Daily, Agarwala, Ravi, MD, 1 mg at 05/19/21 0905 ?  levETIRAcetam (KEPPRA) tablet 1,500 mg, 1,500  mg, Oral, BID, Agarwala, Ravi, MD, 1,500 mg at 05/19/21 0905 ?  loperamide (IMODIUM) capsule 2 mg, 2 mg, Oral, PRN, Lynnell Catalan, MD, 2 mg at 05/17/21 2001 ?  LORazepam (ATIVAN) injection 2 mg, 2 mg, Intravenous, Q4H PRN, Lynnell Catalan, MD, 2 mg at 05/16/21 0210 ?  perflutren lipid microspheres (DEFINITY) IV suspension, 1-10 mL, Intravenous, PRN, Sheilah Pigeon, PA-C, 2 mL at 05/19/21 1405 ?  pneumococcal 20-valent conjugate vaccine (PREVNAR 20) injection 0.5 mL, 0.5 mL, Intramuscular, Tomorrow-1000, Agarwala, Ravi, MD ?  polyethylene glycol (MIRALAX / GLYCOLAX) packet 17 g, 17 g, Per Tube, Daily PRN, Lynnell Catalan, MD ?  sacubitril-valsartan (ENTRESTO) 24-26 mg per tablet, 1 tablet, Oral, BID, Agarwala, Ravi, MD, 1 tablet at 05/19/21 7341 ?  spironolactone (ALDACTONE) tablet 25 mg,  25 mg, Per Tube, Daily, Agarwala, Ravi, MD, 25 mg at 05/19/21 0905 ?  traMADol (ULTRAM) tablet 50 mg, 50 mg, Oral, Q12H PRN, Rolly Salter, MD ? ?Patients Current Diet:  ?Diet Order   ? ?       ?  Diet Heart Room service appropriate? Yes with Assist; Fluid consistency: Thin  Diet effective now       ?  ? ?  ?  ? ?  ? ? ?Precautions / Restrictions ?Precautions ?Precautions: Fall, Other (comment) ?Precaution Comments: watch HR; impulsive/restless ?Restrictions ?Weight Bearing Restrictions: No  ? ?Has the patient had 2 or more falls or a fall with injury in the past year? No ? ?Prior Activity Level ?Community (5-7x/wk): Pt independent PTA ? ?Prior Functional Level ?Self Care: Did the patient need help bathing, dressing, using the toilet or eating? Independent ? ?Indoor Mobility: Did the patient need assistance with walking from room to room (with or without device)? Independent ? ?Stairs: Did the patient need assistance with internal or external stairs (with or without device)? Independent ? ?Functional Cognition: Did the patient need help planning regular tasks such as shopping or remembering to take medications? Independent ? ?Patient Information ?Are you of Hispanic, Latino/a,or Spanish origin?: A. No, not of Hispanic, Latino/a, or Spanish origin ?What is your race?: B. Black or African American ?Do you need or want an interpreter to communicate with a doctor or health care staff?: 0. No ? ?Patient's Response To:  ?Health Literacy and Transportation ?Is the patient able to respond to health literacy and transportation needs?: Yes ?Health Literacy - How often do you need to have someone help you when you read instructions, pamphlets, or other written material from your doctor or pharmacy?: Sometimes ?In the past 12 months, has lack of transportation kept you from medical appointments or from getting medications?: No ?In the past 12 months, has lack of transportation kept you from meetings, work, or from getting  things needed for daily living?: No ? ?Home Assistive Devices / Equipment ?Home Assistive Devices/Equipment: None ?Home Equipment: Agricultural consultant (2 wheels), BSC/3in1, Shower seat - built in, [employer] - tub/shower, Hand held shower head, Adaptive equipment ? ?Prior Device Use: Indicate devices/aids used by the patient prior to current illness, exacerbation or injury? None of the above ? ?Current Functional Level ?Cognition ? Overall Cognitive Status: Impaired/Different from baseline ?Current Attention Level: Sustained ?Orientation Level: Oriented X4 ?Following Commands: Follows one step commands with increased time ?Safety/Judgement: Decreased awareness of safety, Decreased awareness of deficits ?General Comments: pt with poor awareness to deficits, stating she showered this morning when she had not, mother present and correcting pt's statements. ?   ?Extremity Assessment ?(includes Sensation/Coordination) ?  Upper Extremity Assessment: Overall WFL for tasks assessed  ?Lower Extremity Assessment: Defer to PT evaluation  ?  ?ADLs ? Overall ADL's : Needs assistance/impaired ?Eating/Feeding: Set up, Sitting ?Grooming: Set up, Cueing for sequencing, Cueing for safety, Sitting, Oral care, Wash/dry face, Wash/dry hands ?Grooming Details (indicate cue type and reason): cues for placment of items on sink ?Upper Body Bathing: Set up, Sitting ?Lower Body Bathing: Minimal assistance, Sitting/lateral leans ?Upper Body Dressing : Minimal assistance, Sitting ?Lower Body Dressing: Set up, Sitting/lateral leans ?Lower Body Dressing Details (indicate cue type and reason): to don socks ?Toilet Transfer: Min guard, Ambulation, Regular Toilet ?Toilet Transfer Details (indicate cue type and reason): simulated in room ?Toileting- Clothing Manipulation and Hygiene: Supervision/safety, Sitting/lateral lean ?Functional mobility during ADLs: Min guard, Cueing for sequencing, Cueing for safety ?General ADL Comments: pt reports she does not  have cane here in hospital, uses BUE to navigate spaces  ?  ?Mobility ? Overal bed mobility: Needs Assistance ?Bed Mobility: Sit to Supine ?Supine to sit: Min assist, HOB elevated ?Sit to supine: Supervision,

## 2021-05-19 NOTE — Consult Note (Addendum)
?Cardiology Consultation:  ? ?Patient ID: Beth Barnes ?MRN: [id number]; DOB: 1995/11/01 ? ?Admit date: 05/13/2021 ?Date of Consult: 05/19/2021 ? ?PCP:  Patient, No Pcp Per (Inactive) ?  ?[facility] Providers ?Cardiologist:  none previously ? ? ?Patient Profile:  ? ?Beth Barnes is a 26 y.o. 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 Scharlene Gloss. ? ?History of Present Illness:  ? ?[name] was in the hospital as a visitor when she collapsed, hospital staff attended her immediately and she was found in VF, she got epinephrine, cardioversion x2, sodium bicarb, amio, and Narcan with ROSC to ST. ?Post arrest with seizures, intubated/sedated ?Cardiology consulted, noting some QT prolongation of unclear etiology/significance, started on amiodarone gtt with her arrest and continued ? ?LABS were  ?K+ 3.2 > 2.8 > 2.4 > 3.8 > 3.5 > 2.8 > 4.0 and has been wnl since ?Mag 1.6 > 2.9 ?BUN/Creat 11/0.86 ?AST 131> 264 > 211 ?ALT 62 > 129 > 110 ?HS Trop 280 > 1223 > 3101  ?WBC 15.8 ?H/H 13/42 ?Plts 210 ? ?LVEF <20% ? ?Initially required pressor ? ?Extubated, 05/15/21 initially confused ?EEG without seizure ?Amiodarone stopped  ?Not felt volume OL ?Required treatment for withdrawl ? ?Boyfriend reported the pt drinks at least 1/5 of a gallon of liquor daily, also reported in another note by CCM hx of cocaine/marijuana as well (TOX + for benzos only, no ETOH level was done) ?Sister reported that she thought she was supposed to be taking K+ but did not think she was ?Started on HF meds ? ?CCM/noted yesterday reported: "showing signs of frontal brain injury from arrest. Do not think she is having significant withdrawal" ? ?TODAY ?K+ 4.7 ?BUN/Creat <5/0.53 ?Mag 1.8 ?WBC 7.4 ?H/H 12/41 ?Plts 272 ? ?Preg neg ? ? ?EP is asked to see for recommendations +/-ICD ? ?She had a hospital stay Jan 2023 with N/V, ETOH intoxication ?Her potassium of  2.8, magnesium 1.7, ETOH was 140, Tox + THC (I am unable to see her EKGs, no other cardiac testing done) ? ?The patient is seen with her Mom bedside (with the patient's permission) ?She admits to drinking 1/5 or a pint of vodka most days, maybe more or less, occasional marijuana, infrequent cocaine, none lately. ?She does not recall events leading to her hospitalization ?She is AAO self, place, situation ?Tells me Trump is president ?Knows yesterday was Odis Luster ? ?Denies any CP, SOB ?She does report hx of syncope, maybe once a year, at least once not associated with ETOH, perhaps not typically ?She is quite vague, her Mom is only aware of once. ?She was coming out of [employer] started to feel hot and fainted, thinks she was out a few seconds, felt weak afterwards but otherwise ok. ? ?She can not provide any other history of her fainting ? ? ? ?Past Medical History:  ?Diagnosis Date  ? Blind   ? Congenital glaucoma of both eyes   ? Hypokalemia   ? ? ?Past Surgical History:  ?Procedure Laterality Date  ? EYE SURGERY    ?  ? ?Home Medications:  ?Prior to Admission medications   ?Medication Sig Start Date End Date Taking? Authorizing Provider  ?buPROPion (WELLBUTRIN) 75 MG tablet Take 75 mg by mouth daily. 05/07/21   [provider]  ?busPIRone (BUSPAR) 10 MG tablet Take 10 mg by mouth 3 (three) times daily. 04/03/21   [provider]  ?dorzolamide-timolol (  COSOPT) 22.3-6.8 MG/ML ophthalmic solution Place 1 drop into the left eye 2 (two) times daily. 04/28/21   [provider]  ?folic acid (FOLVITE) 1 MG tablet Take 1 tablet by mouth daily. 03/10/21   [provider]  ?ibuprofen (ADVIL) 200 MG tablet Take 200-400 mg by mouth every 6 (six) hours as needed for cramping or mild pain.    [provider]  ?losartan (COZAAR) 50 MG tablet Take 50 mg by mouth daily. 04/15/21   [provider]  ?meclizine (ANTIVERT) 25 MG tablet Take 1 tablet (25 mg total) by mouth 3 (three) times  daily as needed for dizziness or nausea. 11/30/20   Arthor Captain, PA-C  ?naltrexone (DEPADE) 50 MG tablet Take 50 mg by mouth daily. 04/03/21   [provider]  ?ondansetron (ZOFRAN ODT) 4 MG disintegrating tablet Take 1 tablet (4 mg total) by mouth every 8 (eight) hours as needed for nausea or vomiting. ?Patient taking differently: Take 4 mg by mouth every 6 (six) hours as needed for nausea or vomiting. 02/01/20   Pricilla Loveless, MD  ?triamcinolone cream (KENALOG) 0.1 % Apply 2 x daily to the affected area. ?Patient taking differently: Apply 1 application. topically 2 (two) times daily as needed (skin irritation). 11/30/20   Arthor Captain, PA-C  ? ? ?Inpatient Medications: ?Scheduled Meds: ? carvedilol  6.25 mg Per Tube BID WC  ? dapagliflozin propanediol  10 mg Oral Daily  ? folic acid  1 mg Per Tube Daily  ? levETIRAcetam  1,500 mg Oral BID  ? pneumococcal 20-valent conjugate vaccine  0.5 mL Intramuscular Tomorrow-1000  ? sacubitril-valsartan  1 tablet Oral BID  ? spironolactone  25 mg Per Tube Daily  ? ?Continuous Infusions: ? ?PRN Meds: ?acetaminophen, docusate, loperamide, LORazepam, polyethylene glycol, traMADol ? ?Allergies:    ?Allergies  ?Allergen Reactions  ? Cetirizine & Related Hives  ? ? ?Social History:   ?Social History  ? ?Socioeconomic History  ? Marital status: Significant Other  ?  Spouse name: Not on file  ? Number of children: Not on file  ? Years of education: Not on file  ? Highest education level: Not on file  ?Occupational History  ? Not on file  ?Tobacco Use  ? Smoking status: Never  ? Smokeless tobacco: Never  ?Vaping Use  ? Vaping Use: Never used  ?Substance and Sexual Activity  ? Alcohol use: Yes  ?  Alcohol/week: 3.0 standard drinks  ?  Types: 3 Shots of liquor per week  ?  Comment: Every other day  ? Drug use: Not Currently  ? Sexual activity: Not on file  ?Other Topics Concern  ? Not on file  ?Social History Narrative  ? Not on file  ? ?Social Determinants of Health   ? ?Financial Resource Strain: Not on file  ?Food Insecurity: Not on file  ?Transportation Needs: Not on file  ?Physical Activity: Not on file  ?Stress: Not on file  ?Social Connections: Not on file  ?Intimate Partner Violence: Not on file  ?  ?Family History:   ?Family History  ?Problem Relation Age of Onset  ? Healthy Mother   ? Healthy Father   ?  ? ?ROS:  ?Please see the history of present illness.  ?All other ROS reviewed and negative.    ? ?Physical Exam/Data:  ? ?Vitals:  ? 05/19/21 0026 05/19/21 3329 05/19/21 0802 05/19/21 0905  ?BP: (!) 119/95 (!) 131/96 (!) 137/97 (!) 117/91  ?Pulse: 80 76 83 84  ?Resp:  18 17 18    ?Temp: 97.9 ?F (36.6 ?C) 97.9 ?F (36.6 ?C) 98.3 ?F (36.8 ?C)   ?TempSrc: Oral Oral Oral   ?SpO2: 100% 96% 100%   ?Weight:  64 kg    ?Height:      ? ? ?Intake/Output Summary (Last 24 hours) at 05/19/2021 0942 ?Last data filed at 05/18/2021 1000 ?Gross per 24 hour  ?Intake 240 ml  ?Output --  ?Net 240 ml  ? ? ?  05/19/2021  ?  4:38 AM 05/18/2021  ?  6:30 AM 05/17/2021  ?  4:26 AM  ?Last 3 Weights  ?Weight (lbs) 141 lb 3.2 oz 141 lb 5 oz 144 lb 2.9 oz  ?Weight (kg) 64.048 kg 64.1 kg 65.4 kg  ?   ?Body mass index is 22.79 kg/m?.  ?General:  Well nourished, well developed, in no acute distress ?HEENT: normal ?Neck: no JVD ?Vascular: No carotid bruits; Distal pulses 2+ bilaterally ?Cardiac: RRR; no murmurs, gallops or rubs ?Lungs:  CTA b/l, no wheezing, rhonchi or rales  ?Abd: soft, nontender, no hepatomegaly  ?Ext: no edema ?Musculoskeletal:  No deformities ?Skin: warm and dry  ?Neuro:  known blindness, otherwise no focal abnormalities noted ?Psych:  Normal affect, pleasent and cooperative ? ?EKG:  The EKG was personally reviewed and demonstrates:   ? ?SR 87bpm, QT 522, QTc 628 ? ?OLD ?11/30/20: ST 111bpm, QTc 12/02/20 ? ?Date K QTc  ?8/21 3.1 475  ?10/22 3.0 497  ?05/14/21 3.2-2.8 620  ?05/19/21 3.2 462  ?    ? ? ? ? ? ? ? ? ? ?Telemetry:  Telemetry was personally reviewed and demonstrates:   ? ?SR, 70's  mostly ? ?Relevant CV Studies: ? ?05/14/21: TTE ?1. Left ventricular ejection fraction, by estimation, is <20%. The left  ?ventricle has severely decreased function. The left ventricle demonstrates  ?global hypokinesi

## 2021-05-19 NOTE — Progress Notes (Signed)
Heart Failure Stewardship Pharmacist Progress Note ? ? ?PCP: Patient, No Pcp Per (Inactive) ?PCP-Cardiologist: None  ? ? ?HPI:  ?26 yo F with PMH of blindness, glaucoma, and polysubstance abuse. She was [place] her boyfriend [place] [address] and was found unresponsive. Recived CPR, epi, Narcan, and bicarb during ACLS for vfib arrest. Obtained ROSC and was directly admitted to the ICU after intubation. An ECHO was done on 4/5 and LVEF <20% with G2DD an moderately reduced RV function. Extubated 4/6. Possible CIR. ? ?Current HF Medications: ?Beta Blocker: carvedilol 6.25 mg BID ?ACE/ARB/ARNI: Delene Loll 24/26 mg BID ?Aldosterone Antagonist: spironolactone 25 mg daily ?SGLT2i: Farxiga 10 mg daily ? ?Prior to admission HF Medications: ?ACE/ARB/ARNI: losartan 50 mg daily ? ?Pertinent Lab Values: ?As of 4/9: Serum creatinine 0.53, BUN <5, Potassium 4.7, Sodium 135, Magnesium 1.8, A1c 4.7  ? ?Vital Signs: ?Weight: 141 lbs (admission weight: 143 lbs) ?Blood pressure: 130/90s  ?Heart rate: 80s  ?I/O: not well documented ? ?Medication Assistance / Insurance Benefits Check: ?Does the patient have prescription insurance?  Yes ?Type of insurance plan: [place] Medicaid ? ?Outpatient Pharmacy:  ?Prior to admission [facility] ?Is the patient willing to use [facility] at discharge? Yes ?Is the patient willing to transition their outpatient pharmacy to utilize a [facility] outpatient pharmacy?   Pending ?  ? ?Assessment: ?1. Acute systolic CHF (EF 99991111), likely due to NICM. NYHA class II symptoms. ?- Continue carvedilol 6.25 mg BID ?- Continue Entresto 24/26 mg BID ?- Continue spironolactone 25 mg daily ?- Agree with starting Farxiga 10 mg daily ?  ?Plan: ?1) Medication changes recommended at this time: ?- Agree with changes as above ?- Will submit prior authorizations for Delene Loll and Wilder Glade ? ?2) Patient assistance: ?- Prior authorization required for [place] ? ?3)  Education  ?- To be completed prior to  discharge ? ?Kerby Nora, PharmD, BCPS ?Heart Failure Stewardship Pharmacist ?Phone [phone] ? ? ?

## 2021-05-19 NOTE — Progress Notes (Addendum)
Heart Failure Patient Advocate Encounter ?  ?Received notification from [facility] that prior authorization for Beth Barnes is required. ?  ?PA submitted on CoverMyMeds ?Key [id number] ?Status is approved ? ?Received notification from [facility] that prior authorization for Beth Barnes is required. ?  ?PA submitted on CoverMyMeds ?Key [id number] ?Status is approved ? ?Copay $0 ? ?Sharen Hones, PharmD, BCPS ?Heart Failure Stewardship Pharmacist ?Phone [phone] ? ?Please check [email or web address] for unit-specific pharmacist phone numbers ? ?

## 2021-05-19 NOTE — Progress Notes (Signed)
?  Progress Note ?Patient: Beth Barnes SKA:[id number] DOB: 09-20-1995 DOA: 05/13/2021  ?DOS: the patient was seen and examined on 05/19/2021 ? ?Brief hospital course: ?26 year old woman with hx of glaucoma and blindness, polysubstance abuse who was visiting boyfriend in [place] and found unresponsive, unknown downtime.  CPR begun immediately (see separate note).  CRNA intubated.  Initial rhythm vfib. ROSC obtained and patient direct admitted to ICU.  ?4/4 cardiac arrest ~10 min downtime ?4/5 Transferred to [facility] for LTM EEG, intubated on Levophed ?4/5 EEG>This study is suggestive of severe diffuse encephalopathy, nonspecific etiology.  No seizures or epileptiform discharges were seen throughout the recording.Echo 4/5>1. Left ventricular ejection fraction, by estimation, is <20%.  ?4/4 CTH>no acute findings ? ?Assessment and Plan: ?V-fib arrest. ?New onset acute systolic CHF. 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 ?Cardiology consulted. ?Cardiology will consult EP. ?Currently medical therapy recommended. ?Echocardiogram shows EF of 20%.  Global hypokinesis. ?: Coreg, Aldactone, Entresto and Jardiance. ?Monitor. ? ?Postarrest encephalopathy. ?Mentation improving. ?Neurology was consulted. ?Patient had myoclonic jerk after encephalopathy. ?Arms 1500 mg of Keppra twice daily. ?Likely can tolerate a little bit of lower dose.  We will discuss with neurology. ? ?Alcohol abuse. ?Does not appear to have any withdrawal for now. ?Monitor. ? ?Agitation. ?Likely side effect of Keppra. ?Lack of sleep can also contribute to the same symptoms. ?NICU plan was to start the patient on Seroquel although I do not think patient requires Seroquel for now.  Monitor. ? ?Frontal headache. ?Continue Tylenol. ? ?Hypokalemia. ?Replace. ? ?Subjective: Family at bedside.  No nausea or vomiting.  No fever no chills.  Continues to report frontal headache. ? ?Physical Exam: ?Vitals:  ? 05/19/21 0905 05/19/21 1000 05/19/21 1216 05/19/21 1745  ?BP: (!) 117/91  (!) 124/96 (!) 125/91 (!) 125/98  ?Pulse: 84 (!) 105 78 84  ?Resp:  19 16   ?Temp:   98.2 ?F (36.8 ?C)   ?TempSrc:   Oral   ?SpO2:  100% 100%   ?Weight:      ?Height:      ? ?General: Appear in mild distress; no visible Abnormal Neck Mass Or lumps, Conjunctiva normal ?Cardiovascular: S1 and S2 Present, no Murmur, ?Respiratory: good respiratory effort, Bilateral Air entry present and CTA, no Crackles, no wheezes ?Abdomen: Bowel Sound present, Non tender ?Extremities: no Pedal edema ?Neurology: alert and oriented to time, place, and person ?Gait not checked due to patient safety concerns  ? ?Data Reviewed: ?I have Reviewed nursing notes, Vitals, and Lab results since pt's last encounter. Pertinent lab results CBC and BMP ?I have ordered test including CBC and CMP ?I have reviewed the last note from neurology,   ? ?Family Communication: Mother at bedside ? ?Disposition: ?Status is: Inpatient ?Remains inpatient appropriate because: Ongoing work-up for cardiomyopathy ? ?Author: ?Lynden Oxford, MD ?05/19/2021 8:07 PM ? ?For on call review [email or web address]. ?

## 2021-05-20 ENCOUNTER — Other Ambulatory Visit (HOSPITAL_COMMUNITY): Payer: Self-pay

## 2021-05-20 ENCOUNTER — Other Ambulatory Visit: Payer: Self-pay

## 2021-05-20 ENCOUNTER — Inpatient Hospital Stay (HOSPITAL_COMMUNITY): Payer: Medicaid Other

## 2021-05-20 ENCOUNTER — Encounter (HOSPITAL_COMMUNITY): Payer: Self-pay | Admitting: Physical Medicine and Rehabilitation

## 2021-05-20 ENCOUNTER — Inpatient Hospital Stay (HOSPITAL_COMMUNITY)
Admission: RE | Admit: 2021-05-20 | Discharge: 2021-05-23 | DRG: 945 | Disposition: A | Discharge status: Home / Self Care | Payer: Medicaid Other | Source: Intra-hospital | Attending: Physical Medicine and Rehabilitation | Admitting: Physical Medicine and Rehabilitation

## 2021-05-20 DIAGNOSIS — I509 Heart failure, unspecified: Secondary | ICD-10-CM | POA: Diagnosis not present

## 2021-05-20 DIAGNOSIS — I5021 Acute systolic (congestive) heart failure: Secondary | ICD-10-CM | POA: Diagnosis present

## 2021-05-20 DIAGNOSIS — R5381 Other malaise: Secondary | ICD-10-CM | POA: Diagnosis present

## 2021-05-20 DIAGNOSIS — E559 Vitamin D deficiency, unspecified: Secondary | ICD-10-CM | POA: Diagnosis present

## 2021-05-20 DIAGNOSIS — Z7141 Alcohol abuse counseling and surveillance of alcoholic: Secondary | ICD-10-CM

## 2021-05-20 DIAGNOSIS — Z79899 Other long term (current) drug therapy: Secondary | ICD-10-CM | POA: Diagnosis not present

## 2021-05-20 DIAGNOSIS — Z888 Allergy status to other drugs, medicaments and biological substances status: Secondary | ICD-10-CM

## 2021-05-20 DIAGNOSIS — I429 Cardiomyopathy, unspecified: Secondary | ICD-10-CM | POA: Diagnosis present

## 2021-05-20 DIAGNOSIS — I110 Hypertensive heart disease with heart failure: Secondary | ICD-10-CM | POA: Diagnosis present

## 2021-05-20 DIAGNOSIS — H547 Unspecified visual loss: Secondary | ICD-10-CM | POA: Diagnosis present

## 2021-05-20 DIAGNOSIS — E876 Hypokalemia: Secondary | ICD-10-CM | POA: Diagnosis present

## 2021-05-20 DIAGNOSIS — Z8674 Personal history of sudden cardiac arrest: Secondary | ICD-10-CM

## 2021-05-20 DIAGNOSIS — Q150 Congenital glaucoma: Secondary | ICD-10-CM | POA: Diagnosis not present

## 2021-05-20 DIAGNOSIS — G3184 Mild cognitive impairment, so stated: Secondary | ICD-10-CM | POA: Diagnosis present

## 2021-05-20 DIAGNOSIS — Z970 Presence of artificial eye: Secondary | ICD-10-CM

## 2021-05-20 DIAGNOSIS — G4700 Insomnia, unspecified: Secondary | ICD-10-CM | POA: Diagnosis present

## 2021-05-20 DIAGNOSIS — F1010 Alcohol abuse, uncomplicated: Secondary | ICD-10-CM | POA: Diagnosis present

## 2021-05-20 DIAGNOSIS — F17210 Nicotine dependence, cigarettes, uncomplicated: Secondary | ICD-10-CM | POA: Diagnosis present

## 2021-05-20 LAB — GLUCOSE, CAPILLARY
Glucose-Capillary: 73 mg/dL (ref 70–99)
Glucose-Capillary: 84 mg/dL (ref 70–99)
Glucose-Capillary: 87 mg/dL (ref 70–99)
Glucose-Capillary: 90 mg/dL (ref 70–99)
Glucose-Capillary: 93 mg/dL (ref 70–99)

## 2021-05-20 LAB — CBC
HCT: 35.4 % — ABNORMAL LOW (ref 36.0–46.0)
Hemoglobin: 11.5 g/dL — ABNORMAL LOW (ref 12.0–15.0)
MCH: 30.3 pg (ref 26.0–34.0)
MCHC: 32.5 g/dL (ref 30.0–36.0)
MCV: 93.4 fL (ref 80.0–100.0)
Platelets: 327 10*3/uL (ref 150–400)
RBC: 3.79 MIL/uL — ABNORMAL LOW (ref 3.87–5.11)
RDW: 19.2 % — ABNORMAL HIGH (ref 11.5–15.5)
WBC: 6.1 10*3/uL (ref 4.0–10.5)
nRBC: 0 % (ref 0.0–0.2)

## 2021-05-20 LAB — BASIC METABOLIC PANEL
Anion gap: 12 (ref 5–15)
BUN: 6 mg/dL (ref 6–20)
CO2: 19 mmol/L — ABNORMAL LOW (ref 22–32)
Calcium: 9 mg/dL (ref 8.9–10.3)
Chloride: 107 mmol/L (ref 98–111)
Creatinine, Ser: 0.63 mg/dL (ref 0.44–1.00)
GFR, Estimated: >60 mL/min (ref >60)
Glucose, Bld: 85 mg/dL (ref 70–99)
Potassium: 4.2 mmol/L (ref 3.5–5.1)
Sodium: 138 mmol/L (ref 135–145)

## 2021-05-20 MED ORDER — SACUBITRIL-VALSARTAN 24-26 MG PO TABS
1.0000 | ORAL_TABLET | Freq: Two times a day (BID) | ORAL | Status: DC
  Administered 2021-05-20 – 2021-05-23 (×6): 1 via ORAL
  Filled 2021-05-20 (×6): qty 1

## 2021-05-20 MED ORDER — POLYETHYLENE GLYCOL 3350 17 G PO PACK: 17.0000 g | PACK | Freq: Every day | ORAL | Status: DC | PRN

## 2021-05-20 MED ORDER — DAPAGLIFLOZIN PROPANEDIOL 10 MG PO TABS
10.0000 mg | ORAL_TABLET | Freq: Every day | ORAL | Status: DC
  Administered 2021-05-21 – 2021-05-23 (×3): 10 mg via ORAL
  Filled 2021-05-20 (×3): qty 1

## 2021-05-20 MED ORDER — ENSURE ENLIVE PO LIQD
237.0000 mL | Freq: Three times a day (TID) | ORAL | Status: DC
  Administered 2021-05-20: 237 mL via ORAL

## 2021-05-20 MED ORDER — CARVEDILOL 12.5 MG PO TABS
12.5000 mg | ORAL_TABLET | Freq: Two times a day (BID) | ORAL | 0 refills | Status: DC
  Filled 2021-05-20: qty 60, 30d supply, fill #0

## 2021-05-20 MED ORDER — PROCHLORPERAZINE MALEATE 5 MG PO TABS: 5.0000 mg | ORAL_TABLET | Freq: Four times a day (QID) | ORAL | Status: DC | PRN

## 2021-05-20 MED ORDER — GADOBUTROL 1 MMOL/ML IV SOLN
8.0000 mL | Freq: Once | INTRAVENOUS | Status: AC | PRN
  Administered 2021-05-20: 8 mL via INTRAVENOUS

## 2021-05-20 MED ORDER — FLEET ENEMA 7-19 GM/118ML RE ENEM: 1.0000 | ENEMA | Freq: Once | RECTAL | Status: DC | PRN

## 2021-05-20 MED ORDER — LEVETIRACETAM 250 MG PO TABS
1500.0000 mg | ORAL_TABLET | Freq: Two times a day (BID) | ORAL | Status: DC
  Administered 2021-05-20 – 2021-05-22 (×5): 1500 mg via ORAL
  Filled 2021-05-20 (×5): qty 6

## 2021-05-20 MED ORDER — ENOXAPARIN SODIUM 40 MG/0.4ML IJ SOSY
40.0000 mg | PREFILLED_SYRINGE | INTRAMUSCULAR | Status: DC
  Administered 2021-05-20 – 2021-05-22 (×3): 40 mg via SUBCUTANEOUS
  Filled 2021-05-20 (×3): qty 0.4

## 2021-05-20 MED ORDER — PROCHLORPERAZINE 25 MG RE SUPP: 12.5000 mg | Freq: Four times a day (QID) | RECTAL | Status: DC | PRN

## 2021-05-20 MED ORDER — DAPAGLIFLOZIN PROPANEDIOL 5 MG PO TABS
5.0000 mg | ORAL_TABLET | Freq: Every day | ORAL | 0 refills | Status: DC
  Filled 2021-05-20: qty 30, 30d supply, fill #0

## 2021-05-20 MED ORDER — ALUM & MAG HYDROXIDE-SIMETH 200-200-20 MG/5ML PO SUSP: 30.0000 mL | ORAL | Status: DC | PRN

## 2021-05-20 MED ORDER — SPIRONOLACTONE 25 MG PO TABS
25.0000 mg | ORAL_TABLET | Freq: Every day | ORAL | Status: DC
  Administered 2021-05-21 – 2021-05-23 (×3): 25 mg via ORAL
  Filled 2021-05-20 (×3): qty 1

## 2021-05-20 MED ORDER — LEVETIRACETAM 750 MG PO TABS
1500.0000 mg | ORAL_TABLET | Freq: Two times a day (BID) | ORAL | 0 refills | Status: DC
  Filled 2021-05-20: qty 90, 23d supply, fill #0

## 2021-05-20 MED ORDER — CARVEDILOL 12.5 MG PO TABS
12.5000 mg | ORAL_TABLET | Freq: Two times a day (BID) | ORAL | Status: DC
  Administered 2021-05-20: 12.5 mg
  Filled 2021-05-20: qty 1

## 2021-05-20 MED ORDER — DORZOLAMIDE HCL-TIMOLOL MAL 2-0.5 % OP SOLN
1.0000 [drp] | Freq: Two times a day (BID) | OPHTHALMIC | Status: DC
  Administered 2021-05-20 – 2021-05-23 (×6): 1 [drp] via OPHTHALMIC
  Filled 2021-05-20: qty 10

## 2021-05-20 MED ORDER — SPIRONOLACTONE 25 MG PO TABS
25.0000 mg | ORAL_TABLET | Freq: Every day | ORAL | 0 refills | Status: DC
  Filled 2021-05-20: qty 30, 30d supply, fill #0

## 2021-05-20 MED ORDER — ACETAMINOPHEN 325 MG PO TABS: 325.0000 mg | ORAL_TABLET | ORAL | Status: DC | PRN

## 2021-05-20 MED ORDER — SACUBITRIL-VALSARTAN 24-26 MG PO TABS
1.0000 | ORAL_TABLET | Freq: Two times a day (BID) | ORAL | 0 refills | Status: DC
  Filled 2021-05-20: qty 60, 30d supply, fill #0

## 2021-05-20 MED ORDER — ENSURE ENLIVE PO LIQD
237.0000 mL | Freq: Three times a day (TID) | ORAL | Status: DC
  Administered 2021-05-21: 237 mL via ORAL

## 2021-05-20 MED ORDER — ENSURE ENLIVE PO LIQD
237.0000 mL | Freq: Three times a day (TID) | ORAL | 0 refills | Status: DC
  Filled 2021-05-20: qty 10000, 14d supply, fill #0

## 2021-05-20 MED ORDER — FOLIC ACID 1 MG PO TABS
1.0000 mg | ORAL_TABLET | Freq: Every day | ORAL | Status: DC
  Administered 2021-05-21 – 2021-05-23 (×3): 1 mg via ORAL
  Filled 2021-05-20 (×3): qty 1

## 2021-05-20 MED ORDER — CARVEDILOL 12.5 MG PO TABS
12.5000 mg | ORAL_TABLET | Freq: Two times a day (BID) | ORAL | Status: DC
Start: 2021-05-20 — End: 2021-05-23
  Administered 2021-05-20 – 2021-05-23 (×6): 12.5 mg via ORAL
  Filled 2021-05-20 (×6): qty 1

## 2021-05-20 MED ORDER — DIPHENHYDRAMINE HCL 12.5 MG/5ML PO ELIX
12.5000 mg | ORAL_SOLUTION | Freq: Once | ORAL | Status: AC
  Administered 2021-05-20: 12.5 mg via ORAL
  Filled 2021-05-20: qty 10

## 2021-05-20 MED ORDER — SORBITOL 70 % SOLN: 30.0000 mL | Freq: Every day | Status: DC | PRN

## 2021-05-20 MED ORDER — GUAIFENESIN-DM 100-10 MG/5ML PO SYRP: 5.0000 mL | ORAL_SOLUTION | Freq: Four times a day (QID) | ORAL | Status: DC | PRN

## 2021-05-20 MED ORDER — PROCHLORPERAZINE EDISYLATE 10 MG/2ML IJ SOLN: 5.0000 mg | Freq: Four times a day (QID) | INTRAMUSCULAR | Status: DC | PRN

## 2021-05-20 NOTE — Discharge Summary (Signed)
?Physician Discharge Summary ?  ?Patient: Beth Barnes MRN: [id number] DOB: 08-Mar-1995  ?Admit date:     05/13/2021  ?Discharge date: 05/20/21  ?Discharge Physician: Lynden Oxford  ?PCP: Patient, No Pcp Per (Inactive) ? ?Recommendations at discharge: ?Follow up with PCP in 1 week ?Follow up with neuro for seizures. ?No driving  ? ?Discharge Diagnoses: ?Principal Problem: ?  Cardiac arrest (HCC) ?Active Problems: ?  Acute systolic heart failure (HCC) ?  Blindness and low vision ?  Cardiomyopathy, alcoholic (HCC) ? ? ?Hospital Course: ?26 year old woman with hx of glaucoma and blindness, polysubstance abuse who was visiting boyfriend in [place] and found unresponsive, unknown downtime.  CPR begun immediately (see separate note).  CRNA intubated.  Initial rhythm vfib. ROSC obtained and patient direct admitted to ICU.  ?4/4 cardiac arrest ~10 min downtime ?4/5 Transferred to [facility] for LTM EEG, intubated on Levophed ?4/5 EEG>This study is suggestive of severe diffuse encephalopathy, nonspecific etiology.  No seizures or epileptiform discharges were seen throughout the recording.Echo 4/5>1. Left ventricular ejection fraction, by estimation, is <20%.  ?4/4 CTH>no acute findings ? ?Assessment and Plan: ?V-fib arrest. ?New onset acute systolic CHF. 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 ?Cardiology consulted. ?Cardiology will consult EP. ?Currently medical therapy recommended. ?Echocardiogram shows EF of 20%.  Global hypokinesis. ?Coreg, Aldactone, Entresto and Jardiance. ?Monitor. ?  ?Postarrest encephalopathy. ?Persistent myoclonus  ?Mentation improving. ?Neurology was consulted. ?Patient had myoclonic jerk after encephalopathy. ?On 1500 mg of Keppra twice daily. ?Per neuro "MRI brain now less likely to reveal an abnormality given dramatic improvements in her neurological exam over the past 3 days, as well as normalization of her EEG on Thursday." ? ?Alcohol abuse. ?Does not appear to have any withdrawal for  now. ?Monitor. ? ?Agitation. ?Amnesia. ?Likely side effect of Keppra. ?Lack of sleep can also contribute to the same symptoms. ?While in ICU plan was to start the patient on Seroquel although I do not think patient requires Seroquel for now.  Monitor. ?  ?Frontal headache. ?Continue Tylenol. ?  ?Hypokalemia. ?Replace. ? ?Consultants: Neurology  ?PCCM  ?Cardiology  ?Procedures performed:  ?Echocardiogram  ?EEG ?LTM EEG ?DISCHARGE MEDICATION: ?Allergies as of 05/20/2021   ? ?   Reactions  ? Cetirizine & Related Hives  ? ?  ? ?  ?Medication List  ?  ? ?STOP taking these medications   ? ?buPROPion 75 MG tablet ?Commonly known as: WELLBUTRIN ?  ?busPIRone 10 MG tablet ?Commonly known as: BUSPAR ?  ?ibuprofen 200 MG tablet ?Commonly known as: ADVIL ?  ?losartan 50 MG tablet ?Commonly known as: COZAAR ?  ?meclizine 25 MG tablet ?Commonly known as: ANTIVERT ?  ?naltrexone 50 MG tablet ?Commonly known as: DEPADE ?  ?ondansetron 4 MG disintegrating tablet ?Commonly known as: Zofran ODT ?  ? ?  ? ?TAKE these medications   ? ?carvedilol 12.5 MG tablet ?Commonly known as: COREG ?Place 1 tablet (12.5 mg total) into feeding tube 2 (two) times daily with a meal. ?  ?dapagliflozin propanediol 5 MG Tabs tablet ?Commonly known as: FARXIGA ?Take 1 tablet (5 mg total) by mouth daily. ?Start taking on: May 21, 2021 ?  ?dorzolamide-timolol 22.3-6.8 MG/ML ophthalmic solution ?Commonly known as: COSOPT ?Place 1 drop into the left eye 2 (two) times daily. ?  ?feeding supplement Liqd ?Take 237 mLs by mouth 3 (three) times daily between meals. ?  ?folic acid 1 MG tablet ?Commonly known as: FOLVITE ?Take 1 tablet by mouth daily. ?  ?levETIRAcetam 750 MG tablet ?Commonly known  as: KEPPRA ?Take 2 tablets (1,500 mg total) by mouth 2 (two) times daily. ?  ?sacubitril-valsartan 24-26 MG ?Commonly known as: ENTRESTO ?Take 1 tablet by mouth 2 (two) times daily. ?  ?spironolactone 25 MG tablet ?Commonly known as: ALDACTONE ?Take 1 tablet (25 mg total)  by mouth daily. ?Start taking on: May 21, 2021 ?  ?triamcinolone cream 0.1 % ?Commonly known as: KENALOG ?Apply 2 x daily to the affected area. ?What changed:  ?how much to take ?how to take this ?when to take this ?reasons to take this ?additional instructions ?  ? ?  ? ? Follow-up Information   ? ? [facility]. Go in 7 day(s).   ?Specialty: Cardiology ?Why: Hospital Follow-up ?FREE valet parking, Entrance [address] off of [address].  ?Bring ALL medications with you. ?Contact information: ?[address] ?[phone] mc ?[place] [address] ?[phone] ? ?  ?  ? ? PCP. Schedule an appointment as soon as possible for a visit in 1 week(s).   ? ?  ?  ? ?  ?  ? ?  ? ?Disposition: CIR ?Diet recommendation: Cardiac diet ? ?Discharge Exam: ?Filed Weights  ? 05/17/21 0426 05/18/21 0630 05/19/21 0438  ?Weight: 65.4 kg 64.1 kg 64 kg  ? ?General: Appear in no distress; no visible Abnormal Neck Mass Or lumps, Conjunctiva normal ?Cardiovascular: S1 and S2 Present, no Murmur, ?Respiratory: good respiratory effort, Bilateral Air entry present and CTA, no Crackles, no wheezes ?Abdomen: Bowel Sound present Non tender ?Extremities: no Pedal edema ?Neurology: alert and oriented to time, place, and person ?Gait not checked due to patient safety concerns  ? ?Condition at discharge: good ? ?The results of significant diagnostics from this hospitalization (including imaging, microbiology, ancillary and laboratory) are listed below for reference.  ? ?Imaging Studies: ?DG Abd 1 View ? ?Result Date: 05/13/2021 ?CLINICAL DATA:  Nasogastric tube placement EXAM: ABDOMEN - 1 VIEW COMPARISON:  None. FINDINGS: Distal tip of nasogastric tube is seen in distal stomach. IMPRESSION: Distal tip of nasogastric tube seen in distal stomach. Electronically Signed   By: Lupita Raider M.D.   On: 05/13/2021 18:12  ? ?CT HEAD WO CONTRAST ( ) ? ?Result Date: 05/13/2021 ?CLINICAL DATA:  Acute  neurological deficit.  Stroke suspected. EXAM: CT HEAD WITHOUT CONTRAST TECHNIQUE: Contiguous axial images were obtained from the base of the skull through the vertex without intravenous contrast. RADIATION DOSE REDUCTION: This exam was performed according to the departmental dose-optimization program which includes automated exposure control, adjustment of the mA and/or kV according to patient size and/or use of iterative reconstruction technique. COMPARISON:  None. FINDINGS: Brain: No evidence of acute infarction, hemorrhage, hydrocephalus, extra-axial collection or mass lesion/mass effect. Vascular: No hyperdense vessel or unexpected calcification. Skull: Normal. Negative for fracture or focal lesion. Sinuses/Orbits: Paranasal sinuses are clear. Right ocular prosthesis is suggested. Other: None. IMPRESSION: No acute intracranial abnormalities. Electronically Signed   By: Burman Nieves M.D.   On: 05/13/2021 21:53  ? ?DG CHEST PORT 1 VIEW ? ?Result Date: 05/14/2021 ?CLINICAL DATA:  Cardiac arrest EXAM: PORTABLE CHEST 1 VIEW COMPARISON:  05/13/2021 FINDINGS: Endotracheal tube, nasogastric tube, are unchanged. Pulmonary insufflation is stable. Extensive bilateral upper lung zone airspace infiltrate appears stable. There is progressive infiltrate within the left lower lung zone. No pneumothorax or pleural effusion. Cardiac size within normal limits. No acute bone abnormality. IMPRESSION: Stable support tubes. Mild pulmonary hypoinflation, stable. Progressive extensive asymmetric airspace infiltrate in keeping with asymmetric pulmonary  edema or infection. Electronically Signed   By: Helyn Numbers M.D.   On: 05/14/2021 01:50  ? ?DG Chest Port 1 View ? ?Result Date: 05/13/2021 ?CLINICAL DATA:  Endotracheal tube. EXAM: PORTABLE CHEST 1 VIEW COMPARISON:  Chest x-ray 11/30/2020. FINDINGS: Endotracheal tube tip is approximately 2.9 cm above the carina. Enteric tube extends below the diaphragm. Lines and tubes overlie the  chest. There is dense airspace disease in the bilateral upper lobes, left greater than right. Costophrenic angles are clear. No pneumothorax or pleural effusion identified. Cardiomediastinal silhouette is within normal limits. [place]

## 2021-05-20 NOTE — Progress Notes (Signed)
?Cardiology Progress Note  ?Patient ID: Beth Barnes ?MRN: [id number] ?DOB: [birth year] ?Date of Encounter: 05/20/2021 ? ?Primary Cardiologist: None ? ?Subjective  ? ?Chief Complaint: None.  ? ?HPI: No indication for ICD per electrophysiology.  Denies any chest pain or trouble breathing. ? ?ROS:  ?All other ROS reviewed and negative. Pertinent positives noted in the HPI.    ? ?Inpatient Medications  ?Scheduled Meds: ? carvedilol  12.5 mg Per Tube BID WC  ? dapagliflozin propanediol  10 mg Oral Daily  ? folic acid  1 mg Per Tube Daily  ? levETIRAcetam  1,500 mg Oral BID  ? pneumococcal 20-valent conjugate vaccine  0.5 mL Intramuscular Tomorrow-1000  ? sacubitril-valsartan  1 tablet Oral BID  ? spironolactone  25 mg Per Tube Daily  ? ?Continuous Infusions: ? ?PRN Meds: ?acetaminophen, docusate, loperamide, LORazepam, polyethylene glycol, traMADol  ? ?Vital Signs  ? ?Vitals:  ? 05/19/21 1216 05/19/21 1745 05/19/21 2313 05/20/21 1914  ?BP: (!) 125/91 (!) 125/98 (!) 121/104 116/76  ?Pulse: 78 84 74 65  ?Resp: 16  20 15   ?Temp: 98.2 ?F (36.8 ?C)  98.3 ?F (36.8 ?C) 98.3 ?F (36.8 ?C)  ?TempSrc: Oral  Oral Oral  ?SpO2: 100%  100% 100%  ?Weight:      ?Height:      ? ?No intake or output data in the 24 hours ending 05/20/21 0655 ? ?  05/19/2021  ?  4:38 AM 05/18/2021  ?  6:30 AM 05/17/2021  ?  4:26 AM  ?Last 3 Weights  ?Weight (lbs) 141 lb 3.2 oz 141 lb 5 oz 144 lb 2.9 oz  ?Weight (kg) 64.048 kg 64.1 kg 65.4 kg  ?   ? ?Telemetry  ?Overnight telemetry shows sinus rhythm in the 70s, which I personally reviewed.  ? ?Physical Exam  ? ?Vitals:  ? 05/19/21 1216 05/19/21 1745 05/19/21 2313 05/20/21 07/20/21  ?BP: (!) 125/91 (!) 125/98 (!) 121/104 116/76  ?Pulse: 78 84 74 65  ?Resp: 16  20 15   ?Temp: 98.2 ?F (36.8 ?C)  98.3 ?F (36.8 ?C) 98.3 ?F (36.8 ?C)  ?TempSrc: Oral  Oral Oral  ?SpO2: 100%  100% 100%  ?Weight:      ?Height:      ? No intake or output data in the 24 hours ending 05/20/21 0655  ? ?  05/19/2021  ?  4:38 AM 05/18/2021  ?  6:30 AM  05/17/2021  ?  4:26 AM  ?Last 3 Weights  ?Weight (lbs) 141 lb 3.2 oz 141 lb 5 oz 144 lb 2.9 oz  ?Weight (kg) 64.048 kg 64.1 kg 65.4 kg  ?  Body mass index is 22.79 kg/m?.  ? ?General: Well nourished, well developed, in no acute distress ?Head: Atraumatic, normal size  ?Eyes: PEERLA, EOMI  ?Neck: Supple, no JVD ?Endocrine: No thryomegaly ?Cardiac: Normal S1, S2; RRR; no murmurs, rubs, or gallops ?Lungs: Clear to auscultation bilaterally, no wheezing, rhonchi or rales  ?Abd: Soft, nontender, no hepatomegaly  ?Ext: No edema, pulses 2+ ?Musculoskeletal: No deformities, BUE and BLE strength normal and equal ?Skin: Warm and dry, no rashes   ?Neuro: Alert and oriented to person, place, time, and situation, CNII-XII grossly intact, no focal deficits  ?Psych: Normal mood and affect  ? ?Labs  ?High Sensitivity Troponin:   ?Recent Labs  ?Lab 05/13/21 ?1740 05/13/21 ?1849 05/14/21 ?0106  ?TROPONINIHS 280* 1,223* 3,101*  ?   ?Cardiac EnzymesNo results for input(s): TROPONINI in the last 168 hours. No results for input(s): TROPIPOC in  the last 168 hours.  ?Chemistry ?Recent Labs  ?Lab 05/14/21 ?1431 05/15/21 ?0215 05/18/21 ?1632 05/19/21 ?1025 05/20/21 ?0251  ?NA 132*   < > 135 135 138  ?K 3.5   < > 4.7 3.2* 4.2  ?CL 97*   < > 104 104 107  ?CO2 25   < > 19* 26 19*  ?GLUCOSE 197*   < > 86 98 85  ?BUN 10   < > <5* <5* 6  ?CREATININE 1.08*   < > 0.53 0.56 0.63  ?CALCIUM 8.0*   < > 9.4 8.9 9.0  ?PROT 5.6*  --   --   --   --   ?ALBUMIN 2.7*  --   --   --   --   ?AST 211*  --   --   --   --   ?ALT 110*  --   --   --   --   ?ALKPHOS 56  --   --   --   --   ?BILITOT 0.6  --   --   --   --   ?GFRNONAA >60   < > >60 >60 >60  ?ANIONGAP 10   < > 12 5 12   ? < > = values in this interval not displayed.  ?  ?Hematology ?Recent Labs  ?Lab 05/16/21 ?0409 05/18/21 ?1632 05/19/21 ?1025  ?WBC 6.7 7.4 5.6  ?RBC 3.28* 4.32 3.79*  ?HGB 9.7* 12.7 11.4*  ?HCT 31.0* 41.3 35.6*  ?MCV 94.5 95.6 93.9  ?MCH 29.6 29.4 30.1  ?MCHC 31.3 30.8 32.0  ?RDW 19.9* 19.1*  19.3*  ?PLT 146* 272 306  ? ?BNPNo results for input(s): BNP, PROBNP in the last 168 hours.  ?DDimer No results for input(s): DDIMER in the last 168 hours.  ? ?Radiology  ?ECHOCARDIOGRAM LIMITED ? ?Result Date: 05/19/2021 ?   ECHOCARDIOGRAM LIMITED REPORT   Patient Name:   Beth Barnes Date of Exam: 05/19/2021 Medical Rec #:  [id number]     Height:       66.0 in Accession #:    [id number]    Weight:       141.2 lb Date of Birth:  1995-10-07     BSA:          1.725 m? Patient Age:    25 years      BP:           125/91 mmHg Patient Gender: F             HR:           67 bpm. Exam Location:  Inpatient Procedure: Limited Echo, Color Doppler and Intracardiac Opacification Agent Indications:    Cardiac arrest I46.9  History:        Patient has prior history of Echocardiogram examinations, most                 recent 05/14/2021. Polysubstance abuse.  Sonographer:    Leta Jungling RDCS Referring Phys: [id number] RENEE LYNN URSUY IMPRESSIONS  1. Left ventricular ejection fraction, by estimation, is 30 to 35%. The left ventricle has moderately decreased function. The left ventricle demonstrates global hypokinesis.  2. The mitral valve is normal in structure. FINDINGS  Left Ventricle: Left ventricular ejection fraction, by estimation, is 30 to 35%. The left ventricle has moderately decreased function. The left ventricle demonstrates global hypokinesis. Definity contrast agent was given IV to delineate the left ventricular endocardial borders. The left ventricular internal cavity size was normal in size.  There is no left ventricular hypertrophy. Mitral Valve: The mitral valve is normal in structure. Tricuspid Valve: The tricuspid valve is normal in structure. LEFT VENTRICLE PLAX 2D LVIDd:         3.80 cm LVIDs:         3.25 cm LV PW:         1.00 cm LV IVS:        1.10 cm  LV Volumes (MOD) LV vol d, MOD A2C: 84.4 ml LV vol d, MOD A4C: 79.9 ml LV vol s, MOD A2C: 52.9 ml LV vol s, MOD A4C: 47.4 ml LV SV MOD A2C:     31.5 ml LV SV MOD  A4C:     79.9 ml LV SV MOD BP:      32.9 ml LEFT ATRIUM         Index LA diam:    2.30 cm 1.33 cm/m?   AORTA Ao Root diam: 2.80 cm Laurance Flatten MD Electronically signed by Laurance Flatten MD Signature Date/Time: 05/19/2021/2:47:55 PM    Final    ? ?Cardiac Studies  ?TTE 05/19/2021 ? 1. Left ventricular ejection fraction, by estimation, is 30 to 35%. The  ?left ventricle has moderately decreased function. The left ventricle  ?demonstrates global hypokinesis.  ? 2. The mitral valve is normal in structure.  ? ?Patient Profile  ?Beth Barnes is a 27 y.o. female with polysubstance abuse who was admitted on 05/13/2021 with VF arrest.  Found to have newly diagnosed systolic heart failure.  Significant alcohol abuse is documented in the chart. ? ?Assessment & Plan  ? 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 ?-No indication for ICD per EP. ?-Cardiac MRI recommended but this can be obtained as an outpatient.  Really will not change management. ? ?#New onset systolic heart failure, EF 30-35% ?#Alcohol induced cardiomyopathy ?-On appropriate medical therapy.  Increase Coreg to 12.5 mg twice daily.  On Aldactone 25 mg daily.  On Entresto 24-26 mg twice daily.  On Jardiance 10 mg daily. ?-Euvolemic on exam. ?-Overall should consider oral contraception.  This was advised. ?-She is stable for discharge from a heart failure standpoint.  I will arrange hospital follow-up with me in 4 to 6 weeks. ?-No indication for ICD at this time. ? ?#Alcohol abuse ?-Cessation advised. ? ?[facility] will sign off.   ?Medication Recommendations: As above ?Other recommendations (labs, testing, etc): None ?Follow up as an outpatient: She will see me in 4 to 6 weeks as an outpatient.  Cardiac MRI can be obtained as an outpatient. ? ?For questions or updates, please contact [facility] ?Please consult [email or web address] for contact info under  ? ?Signed, ?Gerri Spore T. Flora Lipps, MD, [facility] ?[facility]  [facility]  ?05/20/2021 6:55 AM  ? ?

## 2021-05-20 NOTE — Plan of Care (Signed)
  Problem: Activity: Goal: Risk for activity intolerance will decrease Outcome: Progressing   Problem: Coping: Goal: Level of anxiety will decrease Outcome: Progressing   Problem: Safety: Goal: Ability to remain free from injury will improve Outcome: Progressing   

## 2021-05-20 NOTE — Progress Notes (Signed)
Inpatient Rehab Admissions Coordinator:  ? ?I do not have a bed for this Pt. On CIR this AM. I await insurance auth and will follow for potential admit pending insurance auth. ? ? ?Megan Salon, MS, CCC-SLP ?Rehab Admissions Coordinator  ?[phone] (celll) ?[phone] (office) ? ?

## 2021-05-20 NOTE — Progress Notes (Signed)
Heart Failure Stewardship Pharmacist Progress Note ? ? ?PCP: Patient, No Pcp Per (Inactive) ?PCP-Cardiologist: None  ? ? ?HPI:  ?26 yo F with PMH of blindness, glaucoma, and polysubstance abuse. She was visiting her boyfriend on [address] and was found unresponsive. Recived CPR, epi, Narcan, and bicarb during ACLS for vfib arrest. Obtained ROSC and was directly admitted to the ICU after intubation. An ECHO was done on 4/5 and LVEF <20% with G2DD an moderately reduced RV function. Extubated 4/6. Possible CIR. Repeat ECHO on 4/10 with LVEF of 30-35%. cMRI on 4/11 with EF 42%, without evidence of myocarditis or infiltration or edema. ? ?Current HF Medications: ?Beta Blocker: carvedilol 12.5 mg BID ?ACE/ARB/ARNI: Sherryll Burger 24/26 mg BID ?Aldosterone Antagonist: spironolactone 25 mg daily ?SGLT2i: Farxiga 10 mg daily ? ?Prior to admission HF Medications: ?ACE/ARB/ARNI: losartan 50 mg daily ? ?Pertinent Lab Values: ?Serum creatinine 0.63, BUN 6, Potassium 4.2, Sodium 138, A1c 4.7  ? ?Vital Signs: ?Weight: 141 lbs (admission weight: 143 lbs) ?Blood pressure: 110/80s  ?Heart rate: 80s  ?I/O: not well documented ? ?Medication Assistance / Insurance Benefits Check: ?Does the patient have prescription insurance?  Yes ?Type of insurance plan: [place] Medicaid ? ?Outpatient Pharmacy:  ?Prior to admission [facility] ?Is the patient willing to use [facility] TOC pharmacy at discharge? Yes ?Is the patient willing to transition their outpatient pharmacy to utilize a [facility] outpatient pharmacy?   Pending ?  ? ?Assessment: ?1. Acute systolic CHF (EF 75-64%), due to NICM. cMRI on 4/11 without myocarditis. NYHA class II symptoms. ?- Agree with increasing to carvedilol 12.5 mg BID ?- Continue Entresto 24/26 mg BID ?- Continue spironolactone 25 mg daily ?- Continue Farxiga 10 mg daily ?  ?Plan: ?1) Medication changes recommended at this time: ?- Agree with changes as above ? ?2) Patient assistance: ?- Prior authorization approved for Marcelline Deist  and Sherryll Burger ?- Copays $0 ? ?3)  Education  ?- Patient has been educated on current HF medications and potential additions to HF medication regimen ?- Patient verbalizes understanding that over the next few months, these medication doses may change and more medications may be added to optimize HF regimen ?- Patient has been educated on basic disease state pathophysiology and goals of therapy ? ? ?Sharen Hones, PharmD, BCPS ?Heart Failure Stewardship Pharmacist ?Phone [phone] ? ? ?

## 2021-05-20 NOTE — H&P (Incomplete)
? ? ?Physical Medicine and Rehabilitation Admission H&P ? ?  ?CC: debility secondary to cardiac arrest, heart failure ? ?HPI: Beth Barnes is a 26 year old female who was found unresponsive while visiting her boyfriend who was hospitalized at [facility] on 05/13/2021. CPR initiated and intubated. She was found to be in V-fib. ROSC obtained after approximately 10 minutes of downtime.  She was admitted to the intensive care unit intubated and unresponsive.  She was having intermittent seizures with sinus tachycardia and h marked hypertension.  Cardiology consultation, Dr. Antoine Poche evaluated.  No suspicion of acute coronary syndrome based on initial EKG.  Neurology consultation for EEG on 4/4 for onset of myoclonus.    Required propofol and Versed for whole body myoclonic jerking.  Dr. Jerrell Belfast consulted. Was transferred to [facility] for vEEG.  Dr. Otelia Limes consulted and rapid, repeat and LTM EEGs performed.  Echocardiogram performed left ventricular ejection fraction by estimation is less than 20% and the left ventricle has severely decreased function.  She was extubated on 4/6.  Her EEG normalized and her neurologic exam improved dramatically over the next several days.  Keppra 1500 mg twice daily continued.  She was evaluated by electrophysiology team/Dr. Graciela Husbands.  He advised the patient through alcohol abstention and medical compliance her cardiomyopathy may improve.  He had a lengthy discussion with her that her alcohol is a life-threatening problem for her and maintaining normal potassium level is critical.  The patient was also counseled by heart failure registered nurse regarding diet and medical compliance as well as her polysubstance abuse.  Per her note, the patient admitted to drinking approximately a fifth bottle of vodka daily, has used marijuana and smokes several cigarettes daily.  Patient also stated that she lives with her boyfriend who pays the bills.  Patient transferred to hospitalist service. The  patient requires inpatient physical medicine and rehabilitation evaluations and treatment secondary to dysfunction due to debility secondary to cardiac arrest, acute heart failure/cardiomyopathy, alcohol abuse. ? ?Review of Systems  ?Constitutional:  Negative for chills and fever.  ?HENT:  Negative for hearing loss and sore throat.   ?Eyes:   ?     Blind  ?Respiratory:  Positive for cough. Negative for shortness of breath.   ?     Mild dry cough with tickle in throat  ?Cardiovascular:  Negative for chest pain and leg swelling.  ?Gastrointestinal:  Negative for abdominal pain, nausea and vomiting.  ?Genitourinary:  Negative for dysuria and urgency.  ?Musculoskeletal:  Negative for myalgias and neck pain.  ?Skin:  Negative for itching and rash.  ?Neurological:  Negative for dizziness and headaches.  ?Past Medical History:  ?Diagnosis Date  ? Blind   ? Congenital glaucoma of both eyes   ? Hypokalemia   ? ?Past Surgical History:  ?Procedure Laterality Date  ? EYE SURGERY    ? ?Family History  ?Problem Relation Age of Onset  ? Healthy Mother   ? Healthy Father   ? ?Social History:  reports that she has been smoking cigarettes. She has a 2.50 pack-year smoking history. She has never used smokeless tobacco. She reports current alcohol use of about 3.0 standard drinks per week. She reports that she does not currently use drugs after having used the following drugs: Marijuana. ?Allergies:  ?Allergies  ?Allergen Reactions  ? Cetirizine & Related Hives  ? ?Medications Prior to Admission  ?Medication Sig Dispense Refill  ? buPROPion (WELLBUTRIN) 75 MG tablet Take 75 mg by mouth daily.    ?  busPIRone (BUSPAR) 10 MG tablet Take 10 mg by mouth 3 (three) times daily.    ? dorzolamide-timolol (COSOPT) 22.3-6.8 MG/ML ophthalmic solution Place 1 drop into the left eye 2 (two) times daily.    ? folic acid (FOLVITE) 1 MG tablet Take 1 tablet by mouth daily.    ? ibuprofen (ADVIL) 200 MG tablet Take 200-400 mg by mouth every 6 (six)  hours as needed for cramping or mild pain.    ? losartan (COZAAR) 50 MG tablet Take 50 mg by mouth daily.    ? meclizine (ANTIVERT) 25 MG tablet Take 1 tablet (25 mg total) by mouth 3 (three) times daily as needed for dizziness or nausea. 30 tablet 0  ? naltrexone (DEPADE) 50 MG tablet Take 50 mg by mouth daily.    ? ondansetron (ZOFRAN ODT) 4 MG disintegrating tablet Take 1 tablet (4 mg total) by mouth every 8 (eight) hours as needed for nausea or vomiting. (Patient taking differently: Take 4 mg by mouth every 6 (six) hours as needed for nausea or vomiting.) 10 tablet 0  ? triamcinolone cream (KENALOG) 0.1 % Apply 2 x daily to the affected area. (Patient taking differently: Apply 1 application. topically 2 (two) times daily as needed (skin irritation).) 30 g 0  ? ? ? ? ?Home: ?Home Living ?Family/patient expects to be discharged to:: Private residence ?Living Arrangements: Parent (mother) ?Available Help at Discharge: Family, Available 24 hours/day ?Type of Home: House ?Home Access: Stairs to enter, Ramped entrance ?Entrance Stairs-Number of Steps: 3 ?Entrance Stairs-Rails: None ?Home Layout: One level ?Bathroom Shower/Tub: Walk-in shower ?Bathroom Toilet: Handicapped height ?Home Equipment: Agricultural consultant (2 wheels), BSC/3in1, Shower seat - built in, [employer] - tub/shower, Hand held shower head, Adaptive equipment ?Adaptive Equipment: Reacher, Sock aid ?Additional Comments: info above for pt's mother's house as plan is to d/c home with mother instead of back to pt living with SO ?  ?Functional History: ?Prior Function ?Prior Level of Function : Independent/Modified Independent ?Mobility Comments: Pt would ambulate using her walking stick secondary to being blind. ?ADLs Comments: Pt only able to see shadows. Able to perform all ADLs, clean, cook etc IND. Calls Uber/Ly[employer] for transportation. ? ?Functional Status:  ?Mobility: ?Bed Mobility ?Overal bed mobility: Needs Assistance ?Bed Mobility: Sit to Supine ?Supine to  sit: Min assist, HOB elevated ?Sit to supine: Supervision, HOB elevated ?General bed mobility comments: pt long sitting in bed upno arrival, able to transition to sitting EOB ?Transfers ?Overall transfer level: Needs assistance ?Equipment used: 1 person hand held assist ?Transfers: Sit to/from Stand, Bed to chair/wheelchair/BSC ?Sit to Stand: Min guard ?Bed to/from chair/wheelchair/BSC transfer type:: Step pivot ?Step pivot transfers: Mod assist ?General transfer comment: minG for safety, mild instability with initial stand but pt able to recover without assist ?Ambulation/Gait ?Ambulation/Gait assistance: Min assist ?Gait Distance (Feet): 45 Feet (+15 ft + 15 ft) ?Assistive device: 1 person hand held assist ?Gait Pattern/deviations: Step-through pattern, Decreased stride length, Shuffle, Scissoring, Narrow base of support ?General Gait Details: pt with small steps and narrow BOS, intermittently needing minA to steady as well as constant minA to direct (due to baseline blindness). pt able to complete changes in direction and speed with minA, challenged by prolonged SLS ?Gait velocity: reduced ?Gait velocity interpretation: <1.31 ft/sec, indicative of household ambulator ?  ? ?ADL: ?ADL ?Overall ADL's : Needs assistance/impaired ?Eating/Feeding: Set up, Sitting ?Grooming: Set up, Cueing for sequencing, Cueing for safety, Sitting, Oral care, Wash/dry face, Wash/dry hands ?Grooming Details (indicate cue  type and reason): cues for placment of items on sink ?Upper Body Bathing: Set up, Sitting ?Lower Body Bathing: Minimal assistance, Sitting/lateral leans ?Upper Body Dressing : Minimal assistance, Sitting ?Lower Body Dressing: Set up, Sitting/lateral leans ?Lower Body Dressing Details (indicate cue type and reason): to don socks ?Toilet Transfer: Min guard, Ambulation, Regular Toilet ?Toilet Transfer Details (indicate cue type and reason): simulated in room ?Toileting- Clothing Manipulation and Hygiene:  Supervision/safety, Sitting/lateral lean ?Functional mobility during ADLs: Min guard, Cueing for sequencing, Cueing for safety ?General ADL Comments: pt reports she does not have cane here in hospital, uses BUE to [place]

## 2021-05-20 NOTE — Progress Notes (Signed)
Nurse called reporting Beth Barnes calling asking for something for sleep.  ?Note was reviewed. This provider spoke with pharmacist regarding  her acte care medications was reviewed.  ?We will order Benadryl 12.5 mg tonight, will have Dr Carlis Abbott address her insomnia in the morning.  ?The above discussed with nurse, he verbalizes understanding.  ?

## 2021-05-20 NOTE — Progress Notes (Signed)
Inpatient Rehab Admissions Coordinator:  ? ?I received insurance auth and have a CIR bed for this pt. Today. RN may call report to [phone]. ? ?Megan Salon, MS, CCC-SLP ?Rehab Admissions Coordinator  ?[phone] (celll) ?[phone] (office) ? ? ?

## 2021-05-20 NOTE — H&P (Signed)
? ? ?Physical Medicine and Rehabilitation Admission H&P ? ?  ?CC: Debility secondary to cardiac arrest, heart failure ? ?HPI: Beth Barnes is a 26 year old female who was found unresponsive while visiting her boyfriend who was hospitalized at [facility] on 05/13/2021. CPR initiated and intubated. She was found to be in V-fib. ROSC obtained after approximately 10 minutes of downtime.  She was admitted to the intensive care unit intubated and unresponsive.  She was having intermittent seizures with sinus tachycardia and h marked hypertension.  Cardiology consultation, Dr. Antoine Poche evaluated.  No suspicion of acute coronary syndrome based on initial EKG.  Neurology consultation for EEG on 4/4 for onset of myoclonus.    Required propofol and Versed for whole body myoclonic jerking.  Dr. Jerrell Belfast consulted. Was transferred to [facility] for vEEG.  Dr. Otelia Limes consulted and rapid, repeat and LTM EEGs performed.  Echocardiogram performed left ventricular ejection fraction by estimation is less than 20% and the left ventricle has severely decreased function.  She was extubated on 4/6.  Her EEG normalized and her neurologic exam improved dramatically over the next several days.  Keppra 1500 mg twice daily continued.  She was evaluated by electrophysiology team/Dr. Graciela Husbands.  He advised the patient through alcohol abstention and medical compliance her cardiomyopathy may improve.  He had a lengthy discussion with her that her alcohol is a life-threatening problem for her and maintaining normal potassium level is critical.  The patient was also counseled by heart failure registered nurse regarding diet and medical compliance as well as her polysubstance abuse.  Per her note, the patient admitted to drinking approximately a fifth bottle of vodka daily, has used marijuana and smokes several cigarettes daily.  Patient also stated that she lives with her boyfriend who pays the bills.  Patient transferred to hospitalist service. The  patient requires inpatient physical medicine and rehabilitation evaluations and treatment secondary to dysfunction due to debility secondary to cardiac arrest, acute heart failure/cardiomyopathy, alcohol abuse. Currently with cough. ? ?Review of Systems  ?Constitutional:  Negative for chills and fever.  ?HENT:  Negative for hearing loss and sore throat.   ?Eyes:   ?     Blind  ?Respiratory:  Positive for cough. Negative for shortness of breath.   ?     Mild dry cough with tickle in throat  ?Cardiovascular:  Negative for chest pain and leg swelling.  ?Gastrointestinal:  Negative for abdominal pain, nausea and vomiting.  ?Genitourinary:  Negative for dysuria and urgency.  ?Musculoskeletal:  Negative for myalgias and neck pain.  ?Skin:  Negative for itching and rash.  ?Neurological:  Negative for dizziness and headaches.  ?Past Medical History:  ?Diagnosis Date  ? Blind   ? Congenital glaucoma of both eyes   ? Hypokalemia   ? ?Past Surgical History:  ?Procedure Laterality Date  ? EYE SURGERY    ? ?Family History  ?Problem Relation Age of Onset  ? Healthy Mother   ? Healthy Father   ? ?Social History:  reports that she has been smoking cigarettes. She has a 2.50 pack-year smoking history. She has never used smokeless tobacco. She reports current alcohol use of about 3.0 standard drinks per week. She reports that she does not currently use drugs after having used the following drugs: Marijuana. ?Allergies:  ?Allergies  ?Allergen Reactions  ? Cetirizine & Related Hives  ? ?Medications Prior to Admission  ?Medication Sig Dispense Refill  ? carvedilol (COREG) 12.5 MG tablet Place 1 tablet (12.5 mg total) into  feeding tube 2 (two) times daily with a meal. 60 tablet 0  ? [START ON 05/21/2021] dapagliflozin propanediol (FARXIGA) 5 MG TABS tablet Take 1 tablet (5 mg total) by mouth daily. 30 tablet 0  ? dorzolamide-timolol (COSOPT) 22.3-6.8 MG/ML ophthalmic solution Place 1 drop into the left eye 2 (two) times daily.    ?  feeding supplement (ENSURE ENLIVE / ENSURE PLUS) LIQD Take 237 mLs by mouth 3 (three) times daily between meals. 10000 mL 0  ? folic acid (FOLVITE) 1 MG tablet Take 1 tablet by mouth daily.    ? levETIRAcetam (KEPPRA) 750 MG tablet Take 2 tablets (1,500 mg total) by mouth 2 (two) times daily. 120 tablet 0  ? sacubitril-valsartan (ENTRESTO) 24-26 MG Take 1 tablet by mouth 2 (two) times daily. 60 tablet 0  ? [START ON 05/21/2021] spironolactone (ALDACTONE) 25 MG tablet Take 1 tablet (25 mg total) by mouth daily. 30 tablet 0  ? triamcinolone cream (KENALOG) 0.1 % Apply 2 x daily to the affected area. (Patient taking differently: Apply 1 application. topically 2 (two) times daily as needed (skin irritation).) 30 g 0  ? ?Home: ?Home Living ?Family/patient expects to be discharged to:: Private residence ?Living Arrangements: Parent (mother) ?Available Help at Discharge: Family, Available 24 hours/day ?Type of Home: House ?Home Access: Stairs to enter, Ramped entrance ?Entrance Stairs-Number of Steps: 3 ?Entrance Stairs-Rails: None ?Home Layout: One level ?Bathroom Shower/Tub: Walk-in shower ?Bathroom Toilet: Handicapped height ?Home Equipment: Agricultural consultant (2 wheels), BSC/3in1, Shower seat - built in, [employer] - tub/shower, Hand held shower head, Adaptive equipment ?Adaptive Equipment: Reacher, Sock aid ?Additional Comments: info above for pt's mother's house as plan is to d/c home with mother instead of back to pt living with SO ?  ?Functional History: ?Prior Function ?Prior Level of Function : Independent/Modified Independent ?Mobility Comments: Pt would ambulate using her walking stick secondary to being blind. ?ADLs Comments: Pt only able to see shadows. Able to perform all ADLs, clean, cook etc IND. Calls [employer]/Ly[employer] for transportation. ?  ?Functional Status:  ?Mobility: ?Bed Mobility ?Overal bed mobility: Needs Assistance ?Bed Mobility: Sit to Supine ?Supine to sit: Min assist, HOB elevated ?Sit to supine:  Supervision, HOB elevated ?General bed mobility comments: pt long sitting in bed upno arrival, able to transition to sitting EOB ?Transfers ?Overall transfer level: Needs assistance ?Equipment used: 1 person hand held assist ?Transfers: Sit to/from Stand, Bed to chair/wheelchair/BSC ?Sit to Stand: Min guard ?Bed to/from chair/wheelchair/BSC transfer type:: Step pivot ?Step pivot transfers: Mod assist ?General transfer comment: minG for safety, mild instability with initial stand but pt able to recover without assist ?Ambulation/Gait ?Ambulation/Gait assistance: Min assist ?Gait Distance (Feet): 45 Feet (+15 ft + 15 ft) ?Assistive device: 1 person hand held assist ?Gait Pattern/deviations: Step-through pattern, Decreased stride length, Shuffle, Scissoring, Narrow base of support ?General Gait Details: pt with small steps and narrow BOS, intermittently needing minA to steady as well as constant minA to direct (due to baseline blindness). pt able to complete changes in direction and speed with minA, challenged by prolonged SLS ?Gait velocity: reduced ?Gait velocity interpretation: <1.31 ft/sec, indicative of household ambulator ?  ?ADL: ?ADL ?Overall ADL's : Needs assistance/impaired ?Eating/Feeding: Set up, Sitting ?Grooming: Set up, Cueing for sequencing, Cueing for safety, Sitting, Oral care, Wash/dry face, Wash/dry hands ?Grooming Details (indicate cue type and reason): cues for placment of items on sink ?Upper Body Bathing: Set up, Sitting ?Lower Body Bathing: Minimal assistance, Sitting/lateral leans ?Upper Body Dressing : Minimal  assistance, Sitting ?Lower Body Dressing: Set up, Sitting/lateral leans ?Lower Body Dressing Details (indicate cue type and reason): to don socks ?Toilet Transfer: Min guard, Ambulation, Regular Toilet ?Toilet Transfer Details (indicate cue type and reason): simulated in room ?Toileting- Clothing Manipulation and Hygiene: Supervision/safety, Sitting/lateral lean ?Functional mobility  during ADLs: Min guard, Cueing for sequencing, Cueing for safety ?General ADL Comments: pt reports she does not have cane here in hospital, uses BUE to navigate spaces ?  ?Cognition: ?Cognition ?Overall Cognitive Statu

## 2021-05-20 NOTE — Progress Notes (Incomplete)
Inpatient Rehabilitation Admission Medication Review by a Pharmacist ? ?A complete drug regimen review was completed for this patient to identify any potential clinically significant medication issues. ? ?High Risk Drug Classes Is patient taking? Indication by Medication  ?Antipsychotic No   ?Anticoagulant No   ?Antibiotic No   ?Opioid Yes Tramadol- acute pain  ?Antiplatelet No   ?Hypoglycemics/insulin Yes Farxiga- New onset HFrEF  ?Vasoactive Medication Yes Spironolactone, coreg, Entresto- HFrEF  ?Chemotherapy No   ?Other Yes Keppra- seizure prophylaxis ?Ativan- seizure  ? ? ? ?Type of Medication Issue Identified Description of Issue Recommendation(s)  ?Drug Interaction(s) (clinically significant) ?    ?Duplicate Therapy ?    ?Allergy ?    ?No Medication Administration End Date ?    ?Incorrect Dose ?    ?Additional Drug Therapy Needed ?    ?Significant med changes from prior encounter (inform family/care partners about these prior to discharge).    ?Other ? PTA meds Unreconciled during last admission. Patient's mother stated she handles her own medication  ? ? ?Clinically significant medication issues were identified that warrant physician communication and completion of prescribed/recommended actions by midnight of the next day:  No ? ? ?Time spent performing this drug regimen review (minutes):  30 ? ?Stephan Draughn BS, PharmD, BCPS ?Clinical Pharmacist ?05/20/2021 2:20 PM ? ?Contact: [phone] after 3 PM ? ?"Be curious, not judgmental..." -Debbora Dus ?

## 2021-05-20 NOTE — Progress Notes (Signed)
Heart Failure Nurse Navigator Progress Note ? ?Pt to DC today. Updated Navigators/AVS with HV TOC clinic appt for 4/17 @ 2pm.  ? ?Ozella Rocks, MSN, RN ?Heart Failure Nurse Navigator ? ?

## 2021-05-20 NOTE — Progress Notes (Addendum)
PMR Admission Coordinator Pre-Admission Assessment ?  ?Patient: Beth Barnes is an 26 y.o., female ?MRN: [id number] ?DOB: 1995-06-19 ?Height: 5\' 6"  (167.6 cm) ?Weight: 64 kg ?  ?Insurance Information ?HMO:   PPO:      PCP:      IPA:      80/20:      OTHER:  ?PRIMARY: [place] Medicaid [employer]       Policy#: [id number] N, [id number]     Subscriber: Pt ?CM Name: Guillermina [place]      Phone#: [phone]     Fax#: [phone]  I received insurance auth via fax for 7 days for admit 05/20/21 with update due 05/27/21 ?Pre-Cert#: [id number]      Employer:  ?Benefits:  Phone #:      Name:  ?Eff. Date: 04/09/2021- still active     Deduct:       Out of Pocket Max:       Life Max:  ?CIR:       SNF:  ?Outpatient:      Co-Pay:  ?Home Health:       Co-Pay:  ?DME:      Co-Pay:  ?Providers:  ?  ?SECONDARY:       Policy#:      Phone#:  ?  ?Financial Counselor:       Phone#:  ?  ?The ?Data Collection Information Summary? for patients in Inpatient Rehabilitation Facilities with attached ?Privacy Act [place] Records? was provided and verbally reviewed with: Patient ?  ?Emergency Contact Information ?Contact Information   ?  ?  Name Relation Home Work Mobile  ?  Manson,Tara Mother     [phone]  ?  Jones,Victoria Friend [id number]   [phone]  ?  ?   ?  ?  ?Current Medical History  ?Patient Admitting Diagnosis: Cardiac arrest ?History of Present Illness: Pt. Is a 26 year old woman with hx of blindness, polysubstance abuse who was visiting boyfriend in [place] and found unresponsive, unknown downtime on 05/13/21. downtime.  CPR begun immediately (see separate note).  CRNA intubated.  Initial rhythm vfib.  ROSC obtained and patient direct admitted to ICU.  History obtained from boyfriend. She left briefly and then came back.  History of cocaine/etoh/MJ abuse.  He denies she has any IVDA or opiate use.Postcardiac arrest, pt exhibiting whole body myoclonic jerking but stat rapid EEG negative for seizures. CT of head 4/4 negative for acute  intracranial abnormalities. Urinary toxicology screen positive only for benzodiazepines. ETT 4/4 - 4/6 PT/OT consulted and recommended CIR to assist return to PLOF.  ?  ?Patient's medical record from [facility] has been reviewed by the rehabilitation admission coordinator and physician. ?  ?Past Medical History  ?    ?Past Medical History:  ?Diagnosis Date  ? Blind    ? Congenital glaucoma of both eyes    ? Hypokalemia    ?  ?  ?Has the patient had major surgery during 100 days prior to admission? No ?  ?Family History   ?family history includes Healthy in her father and mother. ?  ?Current Medications ?  ?Current Facility-Administered Medications:  ?  acetaminophen (TYLENOL) tablet 650 mg, 650 mg, Oral, Q6H PRN, Lavina Hamman, MD, 650 mg at 05/19/21 0956 ?  carvedilol (COREG) tablet 6.25 mg, 6.25 mg, Per Tube, BID WC, Agarwala, Ravi, MD, 6.25 mg at 05/19/21 0905 ?  dapagliflozin propanediol (FARXIGA) tablet 10 mg, 10 mg, Oral, Daily, O'Neal, Cassie Freer, MD, 10 mg at 05/19/21 0957 ?  docusate (COLACE) 50 MG/5ML liquid 100 mg, 100 mg, Per Tube, BID PRN, Kipp Brood, MD ?  folic acid (FOLVITE) tablet 1 mg, 1 mg, Per Tube, Daily, Agarwala, Ravi, MD, 1 mg at 05/19/21 0905 ?  levETIRAcetam (KEPPRA) tablet 1,500 mg, 1,500 mg, Oral, BID, Agarwala, Ravi, MD, 1,500 mg at 05/19/21 0905 ?  loperamide (IMODIUM) capsule 2 mg, 2 mg, Oral, PRN, Kipp Brood, MD, 2 mg at 05/17/21 2001 ?  LORazepam (ATIVAN) injection 2 mg, 2 mg, Intravenous, Q4H PRN, Kipp Brood, MD, 2 mg at 05/16/21 0210 ?  perflutren lipid microspheres (DEFINITY) IV suspension, 1-10 mL, Intravenous, PRN, Baldwin Jamaica, PA-C, 2 mL at 05/19/21 1405 ?  pneumococcal 20-valent conjugate vaccine (PREVNAR 20) injection 0.5 mL, 0.5 mL, Intramuscular, Tomorrow-1000, Agarwala, Ravi, MD ?  polyethylene glycol (MIRALAX / GLYCOLAX) packet 17 g, 17 g, Per Tube, Daily PRN, Kipp Brood, MD ?  sacubitril-valsartan (ENTRESTO) 24-26 mg per  tablet, 1 tablet, Oral, BID, Agarwala, Ravi, MD, 1 tablet at 05/19/21 [id number] ?  spironolactone (ALDACTONE) tablet 25 mg, 25 mg, Per Tube, Daily, Agarwala, Ravi, MD, 25 mg at 05/19/21 0905 ?  traMADol (ULTRAM) tablet 50 mg, 50 mg, Oral, Q12H PRN, Lavina Hamman, MD ?  ?Patients Current Diet:  ?Diet Order   ?  ?         ?    Diet Heart Room service appropriate? Yes with Assist; Fluid consistency: Thin  Diet effective now       ?  ?  ?   ?  ?  ?   ?  ?  ?Precautions / Restrictions ?Precautions ?Precautions: Fall, Other (comment) ?Precaution Comments: watch HR; impulsive/restless ?Restrictions ?Weight Bearing Restrictions: No  ?  ?Has the patient had 2 or more falls or a fall with injury in the past year? No ?  ?Prior Activity Level ?Community (5-7x/wk): Pt independent PTA ?  ?Prior Functional Level ?Self Care: Did the patient need help bathing, dressing, using the toilet or eating? Independent ?  ?Indoor Mobility: Did the patient need assistance with walking from room to room (with or without device)? Independent ?  ?Stairs: Did the patient need assistance with internal or external stairs (with or without device)? Independent ?  ?Functional Cognition: Did the patient need help planning regular tasks such as shopping or remembering to take medications? Independent ?  ?Patient Information ?Are you of Hispanic, Latino/a,or Spanish origin?: A. No, not of Hispanic, Latino/a, or Spanish origin ?What is your race?: B. Black or African American ?Do you need or want an interpreter to communicate with a doctor or health care staff?: 0. No ?  ?Patient's Response To:  ?Health Literacy and Transportation ?Is the patient able to respond to health literacy and transportation needs?: Yes ?Health Literacy - How often do you need to have someone help you when you read instructions, pamphlets, or other written material from your doctor or pharmacy?: Sometimes ?In the past 12 months, has lack of transportation kept you from medical  appointments or from getting medications?: No ?In the past 12 months, has lack of transportation kept you from meetings, work, or from getting things needed for daily living?: No ?  ?Home Assistive Devices / Equipment ?Home Assistive Devices/Equipment: None ?Home Equipment: Conservation officer, nature (2 wheels), BSC/3in1, Shower seat - built in, [employer] - tub/shower, Hand held shower head, Adaptive equipment ?  ?Prior Device Use: Indicate devices/aids used by the patient prior to current illness, exacerbation or injury? None of the above ?  ?  Current Functional Level ?Cognition ?  Overall Cognitive Status: Impaired/Different from baseline ?Current Attention Level: Sustained ?Orientation Level: Oriented X4 ?Following Commands: Follows one step commands with increased time ?Safety/Judgement: Decreased awareness of safety, Decreased awareness of deficits ?General Comments: pt with poor awareness to deficits, stating she showered this morning when she had not, mother present and correcting pt's statements. ?   ?Extremity Assessment ?(includes Sensation/Coordination) ?  Upper Extremity Assessment: Overall WFL for tasks assessed  ?Lower Extremity Assessment: Defer to PT evaluation  ?   ?ADLs ?  Overall ADL's : Needs assistance/impaired ?Eating/Feeding: Set up, Sitting ?Grooming: Set up, Cueing for sequencing, Cueing for safety, Sitting, Oral care, Wash/dry face, Wash/dry hands ?Grooming Details (indicate cue type and reason): cues for placment of items on sink ?Upper Body Bathing: Set up, Sitting ?Lower Body Bathing: Minimal assistance, Sitting/lateral leans ?Upper Body Dressing : Minimal assistance, Sitting ?Lower Body Dressing: Set up, Sitting/lateral leans ?Lower Body Dressing Details (indicate cue type and reason): to don socks ?Toilet Transfer: Min guard, Ambulation, Regular Toilet ?Toilet Transfer Details (indicate cue type and reason): simulated in room ?Toileting- Clothing Manipulation and Hygiene: Supervision/safety,  Sitting/lateral lean ?Functional mobility during ADLs: Min guard, Cueing for sequencing, Cueing for safety ?General ADL Comments: pt reports she does not have cane here in hospital, uses BUE to navigate spaces  ?   ?Mob

## 2021-05-21 LAB — CBC WITH DIFFERENTIAL/PLATELET
Abs Immature Granulocytes: 0.03 10*3/uL (ref 0.00–0.07)
Basophils Absolute: 0.1 10*3/uL (ref 0.0–0.1)
Basophils Relative: 1 %
Eosinophils Absolute: 0.1 10*3/uL (ref 0.0–0.5)
Eosinophils Relative: 2 %
HCT: 34.1 % — ABNORMAL LOW (ref 36.0–46.0)
Hemoglobin: 10.8 g/dL — ABNORMAL LOW (ref 12.0–15.0)
Immature Granulocytes: 1 %
Lymphocytes Relative: 39 %
Lymphs Abs: 2.2 10*3/uL (ref 0.7–4.0)
MCH: 30.1 pg (ref 26.0–34.0)
MCHC: 31.7 g/dL (ref 30.0–36.0)
MCV: 95 fL (ref 80.0–100.0)
Monocytes Absolute: 0.7 10*3/uL (ref 0.1–1.0)
Monocytes Relative: 13 %
Neutro Abs: 2.6 10*3/uL (ref 1.7–7.7)
Neutrophils Relative %: 44 %
Platelets: 350 10*3/uL (ref 150–400)
RBC: 3.59 MIL/uL — ABNORMAL LOW (ref 3.87–5.11)
RDW: 19.3 % — ABNORMAL HIGH (ref 11.5–15.5)
WBC: 5.7 10*3/uL (ref 4.0–10.5)
nRBC: 0 % (ref 0.0–0.2)

## 2021-05-21 LAB — VITAMIN D 25 HYDROXY (VIT D DEFICIENCY, FRACTURES): Vit D, 25-Hydroxy: 26.31 ng/mL — ABNORMAL LOW (ref 30–100)

## 2021-05-21 LAB — COMPREHENSIVE METABOLIC PANEL
ALT: 63 U/L — ABNORMAL HIGH (ref 0–44)
AST: 65 U/L — ABNORMAL HIGH (ref 15–41)
Albumin: 2.9 g/dL — ABNORMAL LOW (ref 3.5–5.0)
Alkaline Phosphatase: 41 U/L (ref 38–126)
Anion gap: 9 (ref 5–15)
BUN: <5 mg/dL — ABNORMAL LOW (ref 6–20)
CO2: 23 mmol/L (ref 22–32)
Calcium: 8.7 mg/dL — ABNORMAL LOW (ref 8.9–10.3)
Chloride: 105 mmol/L (ref 98–111)
Creatinine, Ser: 0.6 mg/dL (ref 0.44–1.00)
GFR, Estimated: >60 mL/min (ref >60)
Glucose, Bld: 86 mg/dL (ref 70–99)
Potassium: 2.8 mmol/L — ABNORMAL LOW (ref 3.5–5.1)
Sodium: 137 mmol/L (ref 135–145)
Total Bilirubin: 0.6 mg/dL (ref 0.3–1.2)
Total Protein: 5.6 g/dL — ABNORMAL LOW (ref 6.5–8.1)

## 2021-05-21 LAB — MAGNESIUM: Magnesium: 1.7 mg/dL (ref 1.7–2.4)

## 2021-05-21 MED ORDER — POTASSIUM CHLORIDE 20 MEQ PO PACK
40.0000 meq | PACK | Freq: Two times a day (BID) | ORAL | Status: DC
  Administered 2021-05-21 (×2): 40 meq via ORAL
  Filled 2021-05-21 (×3): qty 2

## 2021-05-21 MED ORDER — MAGNESIUM GLUCONATE 500 MG PO TABS
250.0000 mg | ORAL_TABLET | Freq: Every day | ORAL | Status: DC
  Administered 2021-05-21 – 2021-05-22 (×2): 250 mg via ORAL
  Filled 2021-05-21 (×2): qty 1

## 2021-05-21 MED ORDER — TRAZODONE HCL 50 MG PO TABS
50.0000 mg | ORAL_TABLET | Freq: Every evening | ORAL | Status: DC | PRN
  Administered 2021-05-22: 50 mg via ORAL
  Filled 2021-05-21: qty 1

## 2021-05-21 MED ORDER — MELATONIN 3 MG PO TABS
3.0000 mg | ORAL_TABLET | Freq: Every evening | ORAL | Status: DC | PRN
  Administered 2021-05-21 – 2021-05-22 (×2): 3 mg via ORAL
  Filled 2021-05-21 (×2): qty 1

## 2021-05-21 NOTE — Progress Notes (Signed)
Inpatient Rehabilitation Center ?Individual Statement of Services ? ?Patient Name:  Beth Barnes  ?Date:  05/21/2021 ? ?Welcome to the Inpatient Rehabilitation Center.  Our goal is to provide you with an individualized program based on your diagnosis and situation, designed to meet your specific needs.  With this comprehensive rehabilitation program, you will be expected to participate in at least 3 hours of rehabilitation therapies Monday-Friday, with modified therapy programming on the weekends. ? ?Your rehabilitation program will include the following services:  Physical Therapy (PT), Occupational Therapy (OT), Speech Therapy (ST), 24 hour per day rehabilitation nursing, Therapeutic Recreaction (TR), Neuropsychology, Care Coordinator, Rehabilitation Medicine, Nutrition Services, and Pharmacy Services ? ?Weekly team conferences will be held on wednesday to discuss your progress.  Your Inpatient Rehabilitation Care Coordinator will talk with you frequently to get your input and to update you on team discussions.  Team conferences with you and your family in attendance may also be held. ? ?Expected length of stay: 3-5 days  Overall anticipated outcome: mod/I level ? ?Depending on your progress and recovery, your program may change. Your Inpatient Rehabilitation Care Coordinator will coordinate services and will keep you informed of any changes. Your Inpatient Rehabilitation Care Coordinator's name and contact numbers are listed  below. ? ?The following services may also be recommended but are not provided by the Inpatient Rehabilitation Center:  ? ?Home Health Rehabiltiation Services ?Outpatient Rehabilitation Services ?Vocational Rehabilitation ?  ?Arrangements will be made to provide these services after discharge if needed.  Arrangements include referral to agencies that provide these services. ? ?Your insurance has been verified to be:  well care-Medicaid ?Your primary doctor is:  None ? ?Pertinent information  will be shared with your doctor and your insurance company. ? ?Inpatient Rehabilitation Care Coordinator:  Dossie Der, LCSW [phone] or (C) [phone] ? ?Information discussed with and copy given to patient by: Lucy Chris, 05/21/2021, 10:11 AM    ?

## 2021-05-21 NOTE — Evaluation (Signed)
Speech Language Pathology Assessment and Plan ? ?Patient Details  ?Name: Beth Barnes ?MRN: [id number] ?Date of Birth: 1995/10/22 ? ?SLP Diagnosis: Cognitive Impairments  ?Rehab Potential: Good ?ELOS: 5-7 days  ? ? ?Today's Date: 05/21/2021 ?SLP Individual Time: 4098-1191 ?SLP Individual Time Calculation (min): 60 min ? ? ?Hospital Problem: Principal Problem: ?  Heart failure ([place]) ?Active Problems: ?  Acute systolic heart failure ([place]) ? ?Past Medical History:  ?Past Medical History:  ?Diagnosis Date  ? Blind   ? Congenital glaucoma of both eyes   ? Hypokalemia   ? ?Past Surgical History:  ?Past Surgical History:  ?Procedure Laterality Date  ? EYE SURGERY    ? ? ?Assessment / Plan / Recommendation ?Clinical Impression Pt. Is a 26 year old woman with hx of blindness, polysubstance abuse who was visiting boyfriend in [place] and found unresponsive, unknown downtime on 05/13/21. downtime.  CPR begun immediately (see separate note).  CRNA intubated.  Initial rhythm vfib.  ROSC obtained and patient direct admitted to ICU.  History obtained from boyfriend. She left briefly and then came back.  History of cocaine/etoh/MJ abuse.  He denies she has any IVDA or opiate use.Postcardiac arrest, pt exhibiting whole body myoclonic jerking but stat rapid EEG negative for seizures. CT of head 4/4 negative for acute intracranial abnormalities. Urinary toxicology screen positive only for benzodiazepines. ETT 4/4 - 4/6 PT/OT consulted and recommended CIR to assist return to PLOF.  ?  ?Pt presents with mild cognitive impairments in memory and higher level awareness. Pt expressed acute changes in intermittent confusion and need for substance abuse treatment (therapy and AAA.) Pt scored WFL on SLUMS adjusted due to vision impairments 22/24. Pt demonstrated WFL on Cognistat similarities and judgement subsections. Functional problem solving was not assessed at a high level due to vision impairments, but pt was able to problem solving and navigate  around the room to use the bathroom/wash her hands mod I. Pt demonstrated difficulty recalling novel medications and was able to manage medications, money and time at baseline. Pt required several cues to not ambulate around the room without assistance, demonstrating reduced safety awareness. Pt would benefit from skilled ST services in order to maximize functional independence and reduce burden of care, likely requiring supervision at discharge.  ?  ?Skilled Therapeutic Interventions          Skilled ST services focused on cognitive skills. SLP facilitated administration of cognitive linguistic formal assessment and provided education of results. SLP and pt collaborated to set goals for cognitive linguistic needs during length of stay. All questions answered to satisfaction. Pt demonstrated general recall of types of medication consumed at home and was able to identify novel medications when SLP provided list. SLP educated pt on novel/current medication, pt required mod A fading to min A verbal cues to recall medication name/function/times per day. Pt demonstrated verbal problem solving of medication consumed 1-4x per day mod I. Pt was left in room with call bell within reach and bed alarm set. SLP recommends to continue skilled services.  ?  ?SLP Assessment ? Patient will need skilled Speech Lanaguage Pathology Services during CIR admission  ?  ?Recommendations ? Recommendations for Other Services: Neuropsych consult ?Patient destination: Home ?Follow up Recommendations: 24 hour supervision/assistance ?Equipment Recommended: None recommended by SLP  ?  ?SLP Frequency 3 to 5 out of 7 days   ?SLP Duration ? ?SLP Intensity ? ?SLP Treatment/Interventions 5-7 days ? ?Minumum of 1-2 x/day, 30 to 90 minutes ? ?Cognitive remediation/compensation;Cueing hierarchy;Medication managment;Functional  tasks;Patient/family education;Internal/external aids   ? ?Pain ?Pain Assessment ?Pain Score: 0-No pain ? ?Prior  Functioning ?Cognitive/Linguistic Baseline: Information not available ?Type of Home: House ? Lives With: Family ?Available Help at Discharge: Family;Available 24 hours/day ?Vocation: Full time employment ? ?SLP Evaluation ?Cognition ?Overall Cognitive Status: Impaired/Different from baseline ?Arousal/Alertness: Awake/alert ?Orientation Level: Oriented X4 ?Memory: Impaired ?Memory Impairment: Decreased short term memory;Decreased recall of new information ?Awareness: Impaired ?Awareness Impairment: Anticipatory impairment;Emergent impairment ?Problem Solving: Appears intact ?Behaviors: Restless;Impulsive ?Safety/Judgment: Impaired ?Comments: pt is somewhat impulsive  ?Comprehension ?Auditory Comprehension ?Overall Auditory Comprehension: Appears within functional limits for tasks assessed ?Yes/No Questions: Within Functional Limits ?Commands: Within Functional Limits ?Conversation: Complex ?Expression ?Expression ?Primary Mode of Expression: Verbal ?Verbal Expression ?Overall Verbal Expression: Appears within functional limits for tasks assessed ?Written Expression ?Dominant Hand: Right ?Oral Motor ?Oral Motor/Sensory Function ?Overall Oral Motor/Sensory Function: Within functional limits ?Motor Speech ?Overall Motor Speech: Appears within functional limits for tasks assessed ?Respiration: Within functional limits ?Phonation: Normal ? ?Care Tool ?Care Tool Cognition ?Ability to hear (with hearing aid or hearing appliances if normally used Ability to hear (with hearing aid or hearing appliances if normally used): 0. Adequate - no difficulty in normal conservation, social interaction, listening to TV ?  ?Expression of Ideas and Wants Expression of Ideas and Wants: 4. Without difficulty (complex and basic) - expresses complex messages without difficulty and with speech that is clear and easy to understand ?  ?Understanding Verbal and Non-Verbal Content Understanding Verbal and Non-Verbal Content: 4. Understands (complex  and basic) - clear comprehension without cues or repetitions  ?Memory/Recall Ability Memory/Recall Ability : Current season;Location of own room;Staff names and faces;That he or she is in a hospital/hospital unit  ? ? ?Short Term Goals: ?Week 1: SLP Short Term Goal 1 (Week 1): STG=LTG due to short ELOS ? ?Refer to Care Plan for Long Term Goals ? ?Recommendations for other services: Neuropsych ? ?Discharge Criteria: Patient will be discharged from SLP if patient refuses treatment 3 consecutive times without medical reason, if treatment goals not met, if there is a change in medical status, if patient makes no progress towards goals or if patient is discharged from hospital. ? ?The above assessment, treatment plan, treatment alternatives and goals were discussed and mutually agreed upon: by patient ? ?Rahim Astorga ?05/21/2021, 2:02 PM ? ? ?

## 2021-05-21 NOTE — Progress Notes (Signed)
Well settled in after admission to the unit yesterday. Denies pain or shortness of breath. VS stable. Required benadryl for sleep last night. Mother slept overnight with patient. Remains ambulatory with minimal assistance. Safety maintained. ?

## 2021-05-21 NOTE — Progress Notes (Signed)
?                                                       PROGRESS NOTE ? ? ?Subjective/Complaints: ?Slept poorly last night. Tolerated melatonin poorly in the past. Discussed trying trazodone and she is agreeable. ?Denies shortness of breath, chest pain ?Reviewed her labs with her mother ? ?ROS: +insomnia ? ?Objective: ?  ?MR CARDIAC MORPHOLOGY W WO CONTRAST ? ?Result Date: 05/20/2021 ?CLINICAL DATA:  Sudden Cardiac Death/ Cardiomyopathy EXAM: CARDIAC MRI TECHNIQUE: The patient was scanned on a 1.5 Tesla Siemens magnet. A dedicated cardiac coil was used. Functional imaging was done using Fiesta sequences. 2,3, and 4 chamber views were done to assess for RWMA's. Modified Simpson's rule using a short axis stack was used to calculate an ejection fraction on a dedicated work [employer]. The patient received 8 cc of Gadavist. After 10 minutes inversion recovery sequences were used to assess for infiltration and scar tissue. CONTRAST:  Gadavist FINDINGS: Normal atrial sizes. No ASD/PFO. Small ascending thoracic aorta 2.2 cm Small nearly circumferential pericardial effusion. Normal cardiac valves. Tri leaflet AV Normal RV size and function. Mild LVE. Mild global hypokinesis. No defined RWMAls. Quantitative EF 42% (EDV 89 cc ESV 52 cc SV 37 cc) Delayed enhancement images show non specific mild stripe of mid myocardial uptake diffusely Normal parametric findings T2 55 msec T1 1181 msec ECV 31% IMPRESSION: 1. Mild septal hypertrophy 14 mm with mild global hypokinesis EF 42% 2. Nonspecific mid myocardial gadolinium uptake on delayed inversion recovery sequences 3. Normal parametric measures no evidence of myocarditis or infiltration or edema 4.  Normal RV size and function 5.  Normal cardiac valves 6.  Small but nearly circumferential pericardial effusion Charlton Haws Electronically Signed   By: Charlton Haws M.D.   On: 05/20/2021 09:47   ?Recent Labs  ?  05/20/21 ?1042 05/21/21 ?5809  ?WBC 6.1 5.7  ?HGB  11.5* 10.8*  ?HCT 35.4* 34.1*  ?PLT 327 350  ? ?Recent Labs  ?  05/20/21 ?0251 05/21/21 ?9833  ?NA 138 137  ?K 4.2 2.8*  ?CL 107 105  ?CO2 19* 23  ?GLUCOSE 85 86  ?BUN 6 <5*  ?CREATININE 0.63 0.60  ?CALCIUM 9.0 8.7*  ? ? ?Intake/Output Summary (Last 24 hours) at 05/21/2021 1739 ?Last data filed at 05/21/2021 1325 ?Gross per 24 hour  ?Intake 300 ml  ?Output --  ?Net 300 ml  ?  ? ?  ? ?Physical Exam: ?Vital Signs ?Blood pressure 117/69, pulse 82, temperature 99.3 ?F (37.4 ?C), temperature source Oral, resp. rate 20, height 5\' 6"  (1.676 m), weight 62.1 kg, SpO2 99 %. ? ?Gen: no distress, normal appearing ?HEENT: left nystagmus, congenital blindness ?Cardio: Reg rate ?Chest: normal effort, normal rate of breathing ?Abd: soft, non-distended ?Ext: no edema ?Psych: pleasant, normal affect ?Skin: intact ?Neurological:  ?   General: No focal deficit present.  ?   Mental Status: She is alert and oriented to person, place, and time. No focal deficits ?  ? ?Assessment/Plan: ?1. Functional deficits which require 3+ hours per day of interdisciplinary therapy in a comprehensive inpatient rehab setting. ?Physiatrist is providing close team supervision and 24 hour management of active medical problems listed below. ?Physiatrist and rehab team continue to assess barriers to discharge/monitor patient progress toward functional and medical  goals ? ?Care Tool: ? ?Bathing ?   ?Body parts bathed by patient: Right arm, Left arm, Chest, Abdomen, Front perineal area, Buttocks, Right upper leg, Left upper leg, Face, Left lower leg, Right lower leg  ?   ?  ?  ?Bathing assist Assist Level: Set up assist ?  ?  ?Upper Body Dressing/Undressing ?Upper body dressing   ?What is the patient wearing?: Pull over shirt, Bra ?   ?Upper body assist Assist Level: Set up assist ?   ?Lower Body Dressing/Undressing ?Lower body dressing ? ? ?   ?What is the patient wearing?: Underwear/pull up, Pants ? ?  ? ?Lower body assist Assist for lower body dressing: Set up  assist ?   ? ?Toileting ?Toileting    ?Toileting assist Assist for toileting: Independent ?  ?  ?Transfers ?Chair/bed transfer ? ?Transfers assist ?   ? ?Chair/bed transfer assist level: Supervision/Verbal cueing ?  ?  ?Locomotion ?Ambulation ? ? ?Ambulation assist ? ?   ? ?  ?  ?   ? ?Walk 10 feet activity ? ? ?Assist ?   ? ?  ?   ? ?Walk 50 feet activity ? ? ?Assist   ? ?  ?   ? ? ?Walk 150 feet activity ? ? ?Assist   ? ?  ?  ?  ? ?Walk 10 feet on uneven surface  ?activity ? ? ?Assist   ? ? ?  ?   ? ?Wheelchair ? ? ? ? ?Assist   ?  ?  ? ?  ?   ? ? ?Wheelchair 50 feet with 2 turns activity ? ? ? ?Assist ? ?  ?  ? ? ?   ? ?Wheelchair 150 feet activity  ? ? ? ?Assist ?   ? ? ?   ? ?Blood pressure 117/69, pulse 82, temperature 99.3 ?F (37.4 ?C), temperature source Oral, resp. rate 20, height 5\' 6"  (1.676 m), weight 62.1 kg, SpO2 99 %. ? ? ? Medical Problem List and Plan: ?1. Functional deficits secondary to debility secondary to cardiac arrest with mild cognitive impairment, acute systolic HF ?            -patient may shower ?            -ELOS/Goals: 10-14 days S ?            -Admit to CIR ?2.  Antithrombotics: ?-DVT/anticoagulation:  Pharmaceutical: Lovenox ?            -antiplatelet therapy: none ?3. Pain Management: Tylenol prn ?4. Mood: LCSW to evaluate and provide emotional support ?            -antipsychotic agents: n/a (Buspar and Wellbutrin on med list; NOT being given) ?5. Neuropsych: This patient is capable of making decisions on her own behalf. ?6. Skin/Wound Care: Routine skin care checks ?7. 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            --continue Coreg 12.5 mg BID ?            --continue Aldactone 25 mg daily ?            --continue Entresto 24-26 mg BID ?            --continue Jardiance 10 mg daily ?            --daily weight ?9: Hypokalemia: supplement BID and repeat potassium level  tomorrow.  ?10: Alcohol abuse: cessation counseling. Check magnesium level tomorrow.  ?11: Mild cognitive impairment: SLP eval ?12: Congenital blindness/glaucoma:                          --continue Cosopt drops left eye ?            --has right eye prosthesis ?13. Vitamin D deficiency: start ergocalciferol 50,000U once per week for 7 weeks ?14. Insomnia: start trazodone 50mg  daily prn HS ? ? ?LOS: ?1 days ?A FACE TO FACE EVALUATION WAS PERFORMED ? ? P Veverly Larimer ?05/21/2021, 5:39 PM  ? ?  ?

## 2021-05-21 NOTE — Progress Notes (Signed)
Patient ID: Beth Barnes, female   DOB: Jul 09, 1995, 26 y.o.   MRN: [id number]  Have emailed pt AA meetings in [place] and numbers for Substance abuse centers ?

## 2021-05-21 NOTE — Evaluation (Signed)
Physical Therapy Assessment and Plan ? ?Patient Details  ?Name: Beth Barnes ?MRN: [id number] ?Date of Birth: Apr 05, 1995 ? ?PT Diagnosis: Coordination disorder and Difficulty walking ?Rehab Potential: Good ?ELOS: 3-5 days  ? ?Today's Date: 05/21/2021 ?PT Individual Time: 1530-1630 ?PT Individual Time Calculation (min): 60 min   ? ?Hospital Problem: Principal Problem: ?  Heart failure ([facility]) ?Active Problems: ?  Acute systolic heart failure ([facility]) ? ? ?Past Medical History:  ?Past Medical History:  ?Diagnosis Date  ? Blind   ? Congenital glaucoma of both eyes   ? Hypokalemia   ? ?Past Surgical History:  ?Past Surgical History:  ?Procedure Laterality Date  ? EYE SURGERY    ? ? ?Assessment & Plan ?Clinical Impression: Patient is a 26 year old female who was found unresponsive while visiting her boyfriend who was [facility] at [facility] on [facility]/13/[facility]21. CPR initiated and intubated. She was found to be in V-fib. ROSC obtained after approximately 10 minutes of downtime.  She was admitted to the intensive care unit intubated and unresponsive.  She was having intermittent seizures with sinus tachycardia and h marked hypertension.  Cardiology consultation, Dr. Antoine Poche evaluated.  No suspicion of acute coronary syndrome based on initial EKG.  Neurology consultation for EEG on 4/4 for onset of myoclonus.    Required propofol and Versed for whole body myoclonic jerking.  Dr. Jerrell Belfast consulted. Was transferred to [facility] for vEEG.  Dr. Otelia Limes consulted and rapid, repeat and LTM EEGs performed.  Echocardiogram performed left ventricular ejection fraction by estimation is less than 20% and the left ventricle has severely decreased function.  She was extubated on 4/6.  Her EEG normalized and her neurologic exam improved dramatically over the next several days.  Keppra 1500 mg twice daily continued.  She was evaluated by electrophysiology team/Dr. Graciela Husbands.  He advised the patient through alcohol abstention and medical compliance  her cardiomyopathy may improve.  He had a lengthy discussion with her that her alcohol is a life-threatening problem for her and maintaining normal potassium level is critical.  The patient was also counseled by heart failure registered nurse regarding diet and medical compliance as well as her polysubstance abuse.  Per her note, the patient admitted to drinking approximately a fifth bottle of vodka daily, has used marijuana and smokes several cigarettes daily.  Patient also stated that she lives with her boyfriend who pays the bills.  Patient transferred to hospitalist service. The patient requires inpatient physical medicine and rehabilitation evaluations and treatment secondary to dysfunction due to debility secondary to cardiac arrest, acute heart failure/cardiomyopathy, alcohol abuse. Currently with cough.  Patient transferred to CIR on 05/20/2021 .  ? ?Patient currently requires supervision with mobility secondary to muscle weakness, decreased cardiorespiratoy endurance, and decreased balance strategies.  Prior to hospitalization, patient was modified independent  with mobility and lived with Family in a House home.  Home access is 4Ramped entrance. ? ?Patient will benefit from skilled PT intervention to maximize safe functional mobility, minimize fall risk, and decrease caregiver burden for planned discharge home with intermittent assist.  Anticipate patient will benefit from follow up OP at discharge. ? ?PT - End of Session ?Activity Tolerance: Tolerates 30+ min activity with multiple rests ?Endurance Deficit: Yes ?PT Assessment ?Rehab Potential (ACUTE/IP ONLY): Good ?PT Barriers to Discharge: Decreased caregiver support;Home environment access/layout;Behavior ?PT Patient demonstrates impairments in the following area(s): Balance;Endurance;Safety ?PT Transfers Functional Problem(s): Bed Mobility;Bed to Chair;Car;Furniture;Floor ?PT Locomotion Functional Problem(s): Ambulation;Wheelchair Mobility;Stairs ?PT  Plan ?PT Intensity: Minimum of 1-2 x/day ,45  to 90 minutes ?PT Frequency: 5 out of 7 days ?PT Duration Estimated Length of Stay: 3-5 days ?PT Treatment/Interventions: Ambulation/gait training;Discharge planning;Functional mobility training;Psychosocial support;Therapeutic Activities;Visual/perceptual remediation/compensation;Balance/vestibular training;Disease management/prevention;Neuromuscular re-education;Skin care/wound management;Therapeutic Exercise;Wheelchair propulsion/positioning;Cognitive remediation/compensation;DME/adaptive equipment instruction;Pain management;Splinting/orthotics;UE/LE Strength taining/ROM;Community reintegration;Patient/family education;Stair training;UE/LE Coordination activities ?PT Transfers Anticipated Outcome(s): Mod Indep ?PT Locomotion Anticipated Outcome(s): Ambulatory modified independent ?PT Recommendation ?Recommendations for Other Services: Therapeutic Recreation consult ?Therapeutic Recreation Interventions: Stress management;Outing/community reintergration ?Follow Up Recommendations: Outpatient PT ?Patient destination: Home ?Equipment Recommended: To be determined ? ? ?PT Evaluation ?Precautions/Restrictions ?Precautions ?Precautions: Fall ?Precaution Comments: legally blind ?Restrictions ?Weight Bearing Restrictions: No ?General ?  Vital SignsTherapy Vitals ?Temp: 99.3 ?F (37.4 ?C) ?Temp Source: Oral ?Pulse Rate: 82 ?Resp: 20 ?BP: 117/69 ?Patient Position (if appropriate): Sitting ?Oxygen Therapy ?SpO2: 99 % ?O2 Device: Room Air ?Pain ?Pain Assessment ?Pain Scale: 0-10 ?Pain Score: 0-No pain ?Pain Interference ?Pain Interference ?Pain Effect on Sleep: 0. Does not apply - I have not had any pain or hurting in the past 5 days ?Pain Interference with Therapy Activities: 0. Does not apply - I have not received rehabilitationtherapy in the past 5 days ?Pain Interference with Day-to-Day Activities: 1. Rarely or not at all ?Home Living/Prior Functioning ?Home Living ?Available  Help at Discharge: Family;Available 24 hours/day ?Type of Home: House ?Home Access: Ramped entrance ?Entrance Stairs-Number of Steps: 4 ?Entrance Stairs-Rails: None ?Home Layout: One level ?Bathroom Shower/Tub: Walk-in shower ?Bathroom Toilet: Handicapped height ?Additional Comments: info for SO house ? Lives With: Family ?Prior Function ?Level of Independence: Independent with basic ADLs;Independent with homemaking with ambulation;Independent with gait;Independent with transfers ? Able to Take Stairs?: Yes ?Driving: No ?Vocation: Full time employment ?Vocation Requirements: worked as a Designer, industrial/product with [employer] ?Vision/Perception  ?Vision - History ?Ability to See in Adequate Light: 4 Severely impaired ?Vision - Assessment ?Additional Comments: pt Legally blind. able to see light and some colors ?Perception ?Perception: Within Functional Limits ?Praxis ?Praxis: Intact  ?Cognition ?  ?Sensation ?Sensation ?Light Touch: Appears Intact ?Hot/Cold: Appears Intact ?Proprioception: Appears Intact ?Stereognosis: Not tested ?Coordination ?Gross Motor Movements are Fluid and Coordinated: Yes ?Fine Motor Movements are Fluid and Coordinated: Yes ?Heel Shin Test: [facility] BLE ?Motor  ?Motor ?Motor: Within Functional Limits  ? ?Trunk/Postural Assessment  ?Cervical Assessment ?Cervical Assessment: Within Functional Limits ?Thoracic Assessment ?Thoracic Assessment: Within Functional Limits ?Lumbar Assessment ?Lumbar Assessment: Within Functional Limits ?Postural Control ?Postural Control: Within Functional Limits  ?Balance ?Balance ?Balance Assessed: Yes ?Standardized Balance Assessment ?Standardized Balance Assessment: Berg Balance Test ?Sharlene Motts Balance Test ?Sit to Stand: Able to stand without using hands and stabilize independently ?Standing Unsupported: Able to stand safely 2 minutes ?Sitting with Back Unsupported but Feet Supported on Floor or Stool: Able to sit safely and securely 2 minutes ?Stand to  Sit: Sits safely with minimal use of hands ?Transfers: Able to transfer safely, minor use of hands ?Standing Unsupported with Eyes Closed: Able to stand 10 seconds safely ?Standing Ubsupported with Feet

## 2021-05-21 NOTE — Plan of Care (Signed)
?  Problem: Consults ?Goal: RH GENERAL PATIENT EDUCATION ?Description: See Patient Education module for education specifics. ?Outcome: Adequate for Discharge ?  ?Problem: RH BOWEL ELIMINATION ?Goal: RH STG MANAGE BOWEL WITH ASSISTANCE ?Description: STG Manage Bowel with mod I  Assistance. ?Outcome: Adequate for Discharge ?Goal: RH STG MANAGE BOWEL W/MEDICATION W/ASSISTANCE ?Description: STG Manage Bowel with Medication with mod I Assistance. ?Outcome: Adequate for Discharge ?  ?Problem: RH SAFETY ?Goal: RH STG ADHERE TO SAFETY PRECAUTIONS W/ASSISTANCE/DEVICE ?Description: STG Adhere to Safety Precautions With cues Assistance/Device. ?Outcome: Adequate for Discharge ?  ?Problem: RH PAIN MANAGEMENT ?Goal: RH STG PAIN MANAGED AT OR BELOW PT'S PAIN GOAL ?Description: At or below level 4 with prns ?Outcome: Adequate for Discharge ?  ?Problem: RH KNOWLEDGE DEFICIT GENERAL ?Goal: RH STG INCREASE KNOWLEDGE OF SELF CARE AFTER HOSPITALIZATION ?Description: Patient and mother will be able to manage care at discharge using information independently ?Outcome: Adequate for Discharge ?  ?Problem: Education: ?Goal: Ability to demonstrate management of disease process will improve ?Description: Manage HF with medications and dietary modification independently ?Outcome: Adequate for Discharge ?Goal: Ability to verbalize understanding of medication therapies will improve ?Description: Patient will be able to manage meds independently ?Outcome: Adequate for Discharge ?Goal: Individualized Educational Video(s) ?Outcome: Adequate for Discharge ?  ?

## 2021-05-21 NOTE — Evaluation (Signed)
Occupational Therapy Assessment and Plan ? ?Patient Details  ?Name: Beth Barnes ?MRN: [id number] ?Date of Birth: 1995/08/09 ? ?OT Diagnosis: cognitive deficits ?Rehab Potential: Rehab Potential (ACUTE ONLY): Good ?ELOS: 3-5 days  ? ?Today's Date: 05/21/2021 ?OT Individual Time: 9379-0240 ?OT Individual Time Calculation (min): 60 min    ? ?Hospital Problem: Principal Problem: ?  Heart failure (HCC) ?Active Problems: ?  Acute systolic heart failure (HCC) ? ? ?Past Medical History:  ?Past Medical History:  ?Diagnosis Date  ? Blind   ? Congenital glaucoma of both eyes   ? Hypokalemia   ? ?Past Surgical History:  ?Past Surgical History:  ?Procedure Laterality Date  ? EYE SURGERY    ? ? ?Assessment & Plan ?Clinical Impression:   Beth Barnes is a 26 year old female who was found unresponsive while visiting her boyfriend who was hospitalized at [facility] on 05/13/2021. CPR initiated and intubated. She was found to be in V-fib. ROSC obtained after approximately 10 minutes of downtime.  She was admitted to the intensive care unit intubated and unresponsive.  She was having intermittent seizures with sinus tachycardia and h marked hypertension.  Cardiology consultation, Dr. Antoine Poche evaluated.  No suspicion of acute coronary syndrome based on initial EKG.  Neurology consultation for EEG on 4/4 for onset of myoclonus.    Required propofol and Versed for whole body myoclonic jerking.  Dr. Jerrell Belfast consulted. Was transferred to [facility] for vEEG.  Dr. Otelia Limes consulted and rapid, repeat and LTM EEGs performed.  Echocardiogram performed left ventricular ejection fraction by estimation is less than 20% and the left ventricle has severely decreased function.  She was extubated on 4/6.  Her EEG normalized and her neurologic exam improved dramatically over the next several days.  Keppra 1500 mg twice daily continued.  She was evaluated by electrophysiology team/Dr. Graciela Husbands.  He advised the patient through alcohol abstention and  medical compliance her cardiomyopathy may improve.  He had a lengthy discussion with her that her alcohol is a life-threatening problem for her and maintaining normal potassium level is critical.  The patient was also counseled by heart failure registered nurse regarding diet and medical compliance as well as her polysubstance abuse.  Per her note, the patient admitted to drinking approximately a fifth bottle of vodka daily, has used marijuana and smokes several cigarettes daily.  Patient also stated that she lives with her boyfriend who pays the bills.  Patient transferred to hospitalist service. The patient requires inpatient physical medicine and rehabilitation evaluations and treatment secondary to dysfunction due to debility secondary to cardiac arrest, acute heart failure/cardiomyopathy, alcohol abuse. Currently with cough. ?  Patient transferred to CIR on 05/20/2021 .   ? ?Patient currently requires supervision with basic self-care skills secondary to decreased awareness, decreased problem solving, decreased safety awareness, and decreased memory.  Prior to hospitalization, patient could complete ADLs with independent . ? ?Patient will benefit from skilled intervention to increase independence with basic self-care skills prior to discharge home with care partner.  Anticipate patient will require 24 hour supervision and no further OT follow recommended. ? ?OT - End of Session ?Activity Tolerance: Tolerates 30+ min activity without fatigue ?Endurance Deficit: No ?OT Assessment ?Rehab Potential (ACUTE ONLY): Good ?OT Patient demonstrates impairments in the following area(s): Cognition ?OT Basic ADL's Functional Problem(s): Dressing;Toileting;Bathing ?OT Transfers Functional Problem(s): Toilet;Tub/Shower ?OT Additional Impairment(s): None ?OT Plan ?OT Intensity: Minimum of 1-2 x/day, 45 to 90 minutes ?OT Frequency: 5 out of 7 days ?OT Duration/Estimated Length of Stay: 3-5  days ?OT Treatment/Interventions:  Cognitive remediation/compensation;Disease mangement/prevention;Discharge planning;Patient/family education;Psychosocial support ?OT Self Feeding Anticipated Outcome(s): no goal, pt is independent ?OT Basic Self-Care Anticipated Outcome(s): independent ?OT Toileting Anticipated Outcome(s): independent ?OT Bathroom Transfers Anticipated Outcome(s): independent ?OT Recommendation ?Patient destination: Home ?Follow Up Recommendations: Other (comment) (substance abuse treatment center) ?Equipment Recommended: None recommended by OT ? ? ?OT Evaluation ?Precautions/Restrictions  ?Precautions ?Precautions: Fall ?Precaution Comments: legally blind ?Restrictions ?Weight Bearing Restrictions: No ?Pain ?Pain Assessment ?Pain Score: 0-No pain ?Home Living/Prior Functioning ?Home Living ?Living Arrangements: Spouse/significant other ?Available Help at Discharge: Family, Available 24 hours/day ?Type of Home: House ?Home Access: Stairs to enter, Ramped entrance ?Entrance Stairs-Number of Steps: 3 ?Entrance Stairs-Rails: None ?Home Layout: One level ?Bathroom Shower/Tub: Walk-in shower ?Bathroom Toilet: Handicapped height ?Additional Comments: info above for pt's mother's house as plan is to d/c home with mother instead of back to pt living with SO ? Lives With: Family ?Prior Function ?Level of Independence: Independent with basic ADLs, Independent with homemaking with ambulation, Independent with gait, Independent with transfers ? Able to Take Stairs?: Yes ?Driving: No ?Vocation: Full time employment ?Vocation Requirements: worked as a Designer, industrial/product with [employer] ?Vision ?Baseline Vision/History: 2 Legally blind ?Ability to See in Adequate Light: 4 Severely impaired ?Patient Visual Report: No change from baseline;Other (comment) ?Praxis ?Praxis: Intact ?Cognition ?Cognition ?Overall Cognitive Status: Impaired/Different from baseline ?Arousal/Alertness: Awake/alert ?Orientation Level:  Person;Place;Situation ?Person: Oriented ?Place: Oriented ?Situation: Oriented ?Memory: Impaired ?Memory Impairment: Decreased short term memory;Decreased recall of new information ?Awareness: Impaired (decreased awareness of her need for substance abuse treatment) ?Awareness Impairment: Intellectual impairment ?Behaviors: Restless;Impulsive ?Safety/Judgment: Impaired ?Comments: pt is somewhat impulsive ?Brief Interview for Mental Status (BIMS) ?Repetition of Three Words (First Attempt): 3 ?Temporal Orientation: Year: Correct ?Temporal Orientation: Month: Accurate within 5 days ?Temporal Orientation: Day: Incorrect (Friday) ?Recall: "Sock": Yes, no cue required ?Recall: "Blue": Yes, no cue required ?Recall: "Bed": Yes, no cue required ?BIMS Summary Score: 14 ?Sensation ?Sensation ?Light Touch: Appears Intact ?Hot/Cold: Appears Intact ?Proprioception: Appears Intact ?Stereognosis: Not tested ?Coordination ?Gross Motor Movements are Fluid and Coordinated: Yes ?Fine Motor Movements are Fluid and Coordinated: Yes ?Motor  ?Motor ?Motor: Within Functional Limits  ?Trunk/Postural Assessment  ?Cervical Assessment ?Cervical Assessment: Within Functional Limits ?Thoracic Assessment ?Thoracic Assessment: Within Functional Limits ?Lumbar Assessment ?Lumbar Assessment: Within Functional Limits ?Postural Control ?Postural Control: Within Functional Limits  ?Balance ?Balance ?Balance Assessed:  [facility]) ?Extremity/Trunk Assessment ?RUE Assessment ?RUE Assessment: Within Functional Limits ?LUE Assessment ?LUE Assessment: Within Functional Limits ? ?Care Tool ?Care Tool Self Care ?Eating   ?Eating Assist Level: Independent ?   ?Oral Care    ?Oral Care Assist Level: Independent ?   ?Bathing   ?Body parts bathed by patient: Right arm;Left arm;Chest;Abdomen;Front perineal area;Buttocks;Right upper leg;Left upper leg;Face;Left lower leg;Right lower leg ?  ?  ?Assist Level: Set up assist ?   ?Upper Body Dressing(including orthotics)   ?What  is the patient wearing?: Pull over shirt;Bra ?  ?Assist Level: Set up assist ?   ?Lower Body Dressing (excluding footwear)   ?What is the patient wearing?: Underwear/pull up;Pants ?Assist for lower body dressing: Set up assist

## 2021-05-21 NOTE — Progress Notes (Addendum)
Inpatient Rehabilitation Admission Medication Review by a Pharmacist ? ?A complete drug regimen review was completed for this patient to identify any potential clinically significant medication issues. ? ?High Risk Drug Classes Is patient taking? Indication by Medication  ?Antipsychotic Yes Compazine -N/V  ?Anticoagulant Yes Lovenox - vte ppx  ?Antibiotic No   ?Opioid No   ?Antiplatelet No   ?Hypoglycemics/insulin Yes Farxiga- New onset HFrEF  ?Vasoactive Medication Yes Spironolactone, coreg, Entresto- HFrEF  ?Chemotherapy No   ?Other Yes Keppra- seizure prophylaxis ?Klor-Con - K supplementation ?Cosop - glaucoma  ? ? ? ?Type of Medication Issue Identified Description of Issue Recommendation(s)  ?Drug Interaction(s) (clinically significant) ?    ?Duplicate Therapy ?    ?Allergy ?    ?No Medication Administration End Date ?    ?Incorrect Dose ?    ?Additional Drug Therapy Needed ?    ?Significant med changes from prior encounter (inform family/care partners about these prior to discharge).    ?Other ? PTA meds Unreconciled during last admission. Patient's mother stated she handles her own medication  ? ? ?Clinically significant medication issues were identified that warrant physician communication and completion of prescribed/recommended actions by midnight of the next day:  No ? ? ?Time spent performing this drug regimen review (minutes):  30 ? ?Aryella Besecker Anastasovites BS ?Pharmacy Student ?05/21/2021 9:23 AM ? ?Contact: [phone] after 3 PM ? ?"Be curious, not judgmental..." -Debbora Dus ?

## 2021-05-21 NOTE — Progress Notes (Signed)
Inpatient Rehabilitation Care Coordinator ?Assessment and Plan ?Patient Details  ?Name: Beth Barnes ?MRN: [id number] ?Date of Birth: 02/17/1995 ? ?Today's Date: 05/21/2021 ? ?Hospital Problems: Principal Problem: ?  Heart failure (HCC) ?Active Problems: ?  Acute systolic heart failure (HCC) ? ?Past Medical History:  ?Past Medical History:  ?Diagnosis Date  ? Blind   ? Congenital glaucoma of both eyes   ? Hypokalemia   ? ?Past Surgical History:  ?Past Surgical History:  ?Procedure Laterality Date  ? EYE SURGERY    ? ?Social History:  reports that she has been smoking cigarettes. She has a 2.50 pack-year smoking history. She has never used smokeless tobacco. She reports current alcohol use of about 3.0 standard drinks per week. She reports that she does not currently use drugs after having used the following drugs: Marijuana. ? ?Family / Support Systems ?Marital Status: Single ?Patient Roles: Partner, Parent ?Spouse/Significant Other: has a boyfriend she was living with PTA ?Other Supports: Kallie Locks [phone]  [place]-friend [phone] ?Anticipated Caregiver: Pt plans to go back with boyfriend-she and Mom will need to discuss ?Ability/Limitations of Caregiver: Mom can assist pt is quite high level ?Caregiver Availability: Other (Comment) (need to see what pt is planning to do-boyfriend works and will be gone during the day) ?Family Dynamics: Pt does not plan to go to Mom;s home and she plans to go back with boyfriend where she was living. She will need to discuss with her Mom ? 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 ?Abuse/Neglect ?Abuse/Neglect Assessment Can Be Completed: Yes ?Physical Abuse: Denies ?Verbal Abuse: Denies ?Sexual Abuse: Denies ?Exploitation of patient/patient's resources: Denies ?Self-Neglect: Denies ? ?Patient response to: ?Social Isolation - How often do you feel lonely or isolated from those around you?: Never ? ?Emotional Status ?Pt's affect, behavior and adjustment status: Pt has no memory of the event that happened here, she does not remember being in the hospital She is doing quite well and is high level, will not be here long. She is aware of her need to quit drinking and get her health in order ?Recent Psychosocial Issues: blind since birth history of polysubstance abuse ?Psychiatric History: No history would benefit from seeing neruo-psych but feel she will not be here long enough ?Substance Abuse History: ETOH-poly substance abuse-is willing to go to AA but feels this is enough ? ?Patient / Family Perceptions, Expectations & Goals ?Pt/Family understanding of illness & functional limitations: Pt does not know what happened to her, she is glad she is doing well and did talk with the MD this am while on rounds. Have called mom and left a message ?Premorbid pt/family roles/activities: daughter, girlfriend, new employee, friend ?Anticipated changes in roles/activities/participation: resume ?Pt/family expectations/goals: Pt states: " I plan to go back with my boyfirend and then start my training for my job." ? ?Community Resources ?Community Agencies: None ?Premorbid Home Care/DME Agencies: Other (Comment) (has rw, bsc, tub seat from other family members) ?Transportation available at discharge: boyfriend-uber, taxi family etc ?Is the patient able to  respond to transportation needs?: Yes ?In the past 12 months, has lack of transportation  kept you from medical appointments or from getting medications?: No ?In the past 12 months, has lack of transportation kept you from meetings, work, or from getting things needed for daily living?: No ?Resource referrals recommended: Neuropsychology ? ?Discharge Planning ?Living Arrangements: Spouse/significant other ?Support Systems: Spouse/significant other, Parent, Friends/neighbors ?Type of Residence: Private residence ?Insurance Resources: [employer] (specify county) ?Financial Resources: Family Support ?Financial Screen Referred: No ?Living Expenses: Rent ?Money Management: Significant Other ?Does the patient have any problems obtaining your medications?: No (did not go to the MD PTA) ?Home Management: pt and boyfriend ?Patient/Family Preliminary Plans: Pt plans to return home with boyfirend, her Mom plans for her to come to her home. Both will need to discuss this and pt is able to decide since she is capable of making her own decisons according to the MD. She is quite high level and will be a very short length of stay. ?Care Coordinator Barriers to Discharge: Insurance for SNF coverage, Behavior ?Care Coordinator Anticipated Follow Up Needs: HH/OP ? ?Clinical Impression ?Pleasant female who is high level and wondering reason she is here and not at home with her boyfriend. That is her plan, she will need to discuss with Mom since her Mom wants her to go home with her. Discussed substance abuse resources she wants them e-mailed to her so she can follow up with. Will await therapy team's evaluations. ? ?Lucy Chris ?05/21/2021, 10:09 AM ? ?  ?

## 2021-05-21 NOTE — Plan of Care (Signed)
?  Problem: RH Balance ?Goal: LTG Patient will maintain dynamic sitting balance (PT) ?Description: LTG:  Patient will maintain dynamic sitting balance with assistance during mobility activities (PT) ?Flowsheets (Taken 05/21/2021 1825) ?LTG: Pt will maintain dynamic sitting balance during mobility activities with:: Independent ?Goal: LTG Patient will maintain dynamic standing balance (PT) ?Description: LTG:  Patient will maintain dynamic standing balance with assistance during mobility activities (PT) ?Flowsheets (Taken 05/21/2021 1825) ?LTG: Pt will maintain dynamic standing balance during mobility activities with:: Independent with assistive device  ?  ?Problem: RH Bed Mobility ?Goal: LTG Patient will perform bed mobility with assist (PT) ?Description: LTG: Patient will perform bed mobility with assistance, with/without cues (PT). ?Flowsheets (Taken 05/21/2021 1825) ?LTG: Pt will perform bed mobility with assistance level of: Independent ?  ?Problem: RH Bed to Chair Transfers ?Goal: LTG Patient will perform bed/chair transfers w/assist (PT) ?Description: LTG: Patient will perform bed to chair transfers with assistance (PT). ?Flowsheets (Taken 05/21/2021 1825) ?LTG: Pt will perform Bed to Chair Transfers with assistance level: Independent with assistive device  ?  ?Problem: RH Car Transfers ?Goal: LTG Patient will perform car transfers with assist (PT) ?Description: LTG: Patient will perform car transfers with assistance (PT). ?Flowsheets (Taken 05/21/2021 1825) ?LTG: Pt will perform car transfers with assist:: Independent with assistive device  ?  ?Problem: RH Furniture Transfers ?Goal: LTG Patient will perform furniture transfers w/assist (OT/PT) ?Description: LTG: Patient will perform furniture transfers  with assistance (OT/PT). ?Flowsheets (Taken 05/21/2021 1825) ?LTG: Pt will perform furniture transfers with assist:: Independent with assistive device  ?  ?Problem: RH Ambulation ?Goal: LTG Patient will ambulate in  controlled environment (PT) ?Description: LTG: Patient will ambulate in a controlled environment, # of feet with assistance (PT). ?Flowsheets (Taken 05/21/2021 1825) ?LTG: Pt will ambulate in controlled environ  assist needed:: Independent with assistive device ?LTG: Ambulation distance in controlled environment: 161ft ?Goal: LTG Patient will ambulate in home environment (PT) ?Description: LTG: Patient will ambulate in home environment, # of feet with assistance (PT). ?Flowsheets (Taken 05/21/2021 1825) ?LTG: Pt will ambulate in home environ  assist needed:: Independent with assistive device ?LTG: Ambulation distance in home environment: 62ft with LRAD ?  ?Problem: RH Stairs ?Goal: LTG Patient will ambulate up and down stairs w/assist (PT) ?Description: LTG: Patient will ambulate up and down # of stairs with assistance (PT) ?Flowsheets (Taken 05/21/2021 1825) ?LTG: Pt will ambulate up/down stairs assist needed:: Independent with assistive device ?LTG: Pt will  ambulate up and down number of stairs: 3 steps to access home with 1 rail ?  ?

## 2021-05-21 NOTE — Progress Notes (Signed)
Inpatient Rehabilitation  Patient information reviewed and entered into eRehab system by Jahayra Mazo M. Paden Kuras, M.A., CCC/SLP, PPS Coordinator.  Information including medical coding, functional ability and quality indicators will be reviewed and updated through discharge.    

## 2021-05-22 ENCOUNTER — Other Ambulatory Visit (HOSPITAL_COMMUNITY): Payer: Self-pay

## 2021-05-22 LAB — BASIC METABOLIC PANEL
Anion gap: 7 (ref 5–15)
BUN: <5 mg/dL — ABNORMAL LOW (ref 6–20)
CO2: 23 mmol/L (ref 22–32)
Calcium: 9 mg/dL (ref 8.9–10.3)
Chloride: 107 mmol/L (ref 98–111)
Creatinine, Ser: 0.63 mg/dL (ref 0.44–1.00)
GFR, Estimated: >60 mL/min (ref >60)
Glucose, Bld: 94 mg/dL (ref 70–99)
Potassium: 3.1 mmol/L — ABNORMAL LOW (ref 3.5–5.1)
Sodium: 137 mmol/L (ref 135–145)

## 2021-05-22 MED ORDER — ACETAMINOPHEN 325 MG PO TABS
325.0000 mg | ORAL_TABLET | ORAL | Status: DC | PRN
Start: 2021-05-22 — End: 2021-06-03

## 2021-05-22 MED ORDER — CARVEDILOL 12.5 MG PO TABS
12.5000 mg | ORAL_TABLET | Freq: Two times a day (BID) | ORAL | 0 refills | Status: DC
  Filled 2021-05-22: qty 60, 30d supply, fill #0

## 2021-05-22 MED ORDER — POTASSIUM CHLORIDE CRYS ER 20 MEQ PO TBCR
40.0000 meq | EXTENDED_RELEASE_TABLET | Freq: Two times a day (BID) | ORAL | Status: DC
  Administered 2021-05-22 – 2021-05-23 (×3): 40 meq via ORAL
  Filled 2021-05-22 (×3): qty 2

## 2021-05-22 MED ORDER — SACUBITRIL-VALSARTAN 24-26 MG PO TABS
1.0000 | ORAL_TABLET | Freq: Two times a day (BID) | ORAL | 0 refills | Status: DC
  Filled 2021-05-22: qty 60, 30d supply, fill #0

## 2021-05-22 MED ORDER — LEVETIRACETAM 750 MG PO TABS
1500.0000 mg | ORAL_TABLET | Freq: Two times a day (BID) | ORAL | 0 refills | Status: DC
  Filled 2021-05-22: qty 120, 30d supply, fill #0

## 2021-05-22 MED ORDER — MAGNESIUM GLUCONATE 500 MG PO TABS
250.0000 mg | ORAL_TABLET | Freq: Every day | ORAL | 0 refills | Status: DC
  Filled 2021-05-22: qty 30, 60d supply, fill #0

## 2021-05-22 MED ORDER — FOLIC ACID 1 MG PO TABS
1.0000 mg | ORAL_TABLET | Freq: Every day | ORAL | 0 refills | Status: DC
  Filled 2021-05-22: qty 30, 30d supply, fill #0

## 2021-05-22 MED ORDER — DAPAGLIFLOZIN PROPANEDIOL 10 MG PO TABS
10.0000 mg | ORAL_TABLET | Freq: Every day | ORAL | 0 refills | Status: DC
  Filled 2021-05-22: qty 30, 30d supply, fill #0

## 2021-05-22 MED ORDER — SPIRONOLACTONE 25 MG PO TABS
25.0000 mg | ORAL_TABLET | Freq: Every day | ORAL | 0 refills | Status: DC
Start: 2021-05-22 — End: 2021-06-03
  Filled 2021-05-22: qty 30, 30d supply, fill #0

## 2021-05-22 NOTE — Patient Care Conference (Signed)
Inpatient RehabilitationTeam Conference and Plan of Care Update ?Date: 05/21/2021   Time: 11:13 AM  ? ? ?Patient Name: Beth Barnes      ?Medical Record Number: [id number]  ?Date of Birth: [birth year] ?Sex: Female         ?Room/Bed: 4M07C/4M07C-01 ?Payor Info: Payor: [place] MEDICAID PREPAID HEALTH PLAN / Plan: [place] MEDICAID [facility] / Product Type: *No Product type* /   ? ?Admit Date/Time:  05/20/2021  3:47 PM ? ?Primary Diagnosis:  Heart failure (HCC) ? ?Hospital Problems: Principal Problem: ?  Heart failure (HCC) ?Active Problems: ?  Acute systolic heart failure (HCC) ? ? ? ?Expected Discharge Date: Expected Discharge Date:  (evals pending; Short ELOS) ? ?Team Members Present: ?Physician leading conference: Dr. Sula Soda ?Social Worker Present: Dossie Der, LCSW ?Nurse Present: Chana Bode, RN ?PT Present: Wynelle Link, PT ?OT Present: Dolphus Jenny, OT ?SLP Present: Other (comment) Sarita Bottom, SLP) ?PPS Coordinator present : Fae Pippin, SLP ? ?   Current Status/Progress Goal Weekly Team Focus  ?Bowel/Bladder ? ? Continent of B/B. LBM 05/19/21  Maintain continence.      ?Swallow/Nutrition/ Hydration ? ?           ?ADL's ? ?           ?Mobility ? ? PT EVAL PENDING         ?Communication ? ?           ?Safety/Cognition/ Behavioral Observations ?           ?Pain ? ? Denies Pain.  Will remain pain free. <3/10.      ?Skin ? ? Grossly intact  Maintain skin integrity.  Asses Q shift and prn.   ? ? ?Discharge Planning:  ?Pt plans to go home with boyfriend who does work during the day-she and MOm need to discuss this   ?Team Discussion: ?Patient has no memory of event or understanding of rationale for therapy. Anxious to go home and notes she wants to go home with boy friend and not mother. ? ?Patient on target to meet rehab goals: ?Evals pending ? ?*See Care Plan and progress notes for long and short-term goals.  ? ?Revisions to Treatment Plan:  ?Neuro psych referral recommended ?  ?Teaching Needs: ?Safety,  medication management, dietary modification recommendations, etc  ?Current Barriers to Discharge: ?Decreased caregiver support and congenital blindness ? ?Possible Resolutions to Barriers: ?Family education ?AA recommended post discharge ?  ? ? Medical Summary ?  ?  ?  ?  ? ? ?  ? ? ?I attest that I was present, lead the team conference, and concur with the assessment and plan of the team. ? ? ?Chana Bode B ?05/22/2021, 11:03 AM  ? ? ? ? ? ? ?

## 2021-05-22 NOTE — Progress Notes (Signed)
?                                                       PROGRESS NOTE ? ? ?Subjective/Complaints: ?Did great with therapy today! ?Therapy team feels she can discharge tomorrow, discussed with patient and she is agreeable ?Delayed recall is significantly impaired ? ?ROS: +insomnia, +congenital blindness ? ?Objective: ?  ?No results found. ?Recent Labs  ?  05/20/21 ?1042 05/21/21 ?6063  ?WBC 6.1 5.7  ?HGB 11.5* 10.8*  ?HCT 35.4* 34.1*  ?PLT 327 350  ? ?Recent Labs  ?  05/21/21 ?0160 05/22/21 ?1093  ?NA 137 137  ?K 2.8* 3.1*  ?CL 105 107  ?CO2 23 23  ?GLUCOSE 86 94  ?BUN <5* <5*  ?CREATININE 0.60 0.63  ?CALCIUM 8.7* 9.0  ? ? ?Intake/Output Summary (Last 24 hours) at 05/22/2021 1318 ?Last data filed at 05/22/2021 0700 ?Gross per 24 hour  ?Intake 238 ml  ?Output --  ?Net 238 ml  ?  ? ?  ? ?Physical Exam: ?Vital Signs ?Blood pressure 129/90, pulse 67, temperature 98.3 ?F (36.8 ?C), resp. rate 18, height 5\' 6"  (1.676 m), weight 62.1 kg, SpO2 100 %. ? ?Gen: no distress, normal appearing ?HEENT: left nystagmus, congenital blindness ?Cardio: Reg rate ?Chest: normal effort, normal rate of breathing ?Abd: soft, non-distended ?Ext: no edema ?Psych: pleasant, normal affect ?Skin: intact ?Neurological:  ?   General: No focal deficit present.  ?   Mental Status: She is alert and oriented to person, place, and time. No focal deficits ?Ambulating modI ?  ? ?Assessment/Plan: ?1. Functional deficits which require 3+ hours per day of interdisciplinary therapy in a comprehensive inpatient rehab setting. ?Physiatrist is providing close team supervision and 24 hour management of active medical problems listed below. ?Physiatrist and rehab team continue to assess barriers to discharge/monitor patient progress toward functional and medical goals ? ?Care Tool: ? ?Bathing ?   ?Body parts bathed by patient: Right arm, Left arm, Chest, Abdomen, Front perineal area, Buttocks, Right upper leg, Left upper leg, Face, Left lower leg, Right lower leg   ?   ?  ?  ?Bathing assist Assist Level: Independent ?  ?  ?Upper Body Dressing/Undressing ?Upper body dressing   ?What is the patient wearing?: Pull over shirt, Bra ?   ?Upper body assist Assist Level: Independent ?   ?Lower Body Dressing/Undressing ?Lower body dressing ? ? ?   ?What is the patient wearing?: Underwear/pull up, Pants ? ?  ? ?Lower body assist Assist for lower body dressing: Independent ?   ? ?Toileting ?Toileting    ?Toileting assist Assist for toileting: Independent ?  ?  ?Transfers ?Chair/bed transfer ? ?Transfers assist ?   ? ?Chair/bed transfer assist level: Independent with assistive device ?Chair/bed transfer assistive device: Cane ?  ?Locomotion ?Ambulation ? ? ?Ambulation assist ? ?   ? ?Assist level: Independent with assistive device ?Assistive device: Other (comment) (walking cane) ?Max distance: 1091ft  ? ?Walk 10 feet activity ? ? ?Assist ?   ? ?Assist level: Independent with assistive device ?Assistive device: Other (comment)  ? ?Walk 50 feet activity ? ? ?Assist   ? ?Assist level: Independent with assistive device ?Assistive device: Other (comment)  ? ? ?Walk 150 feet activity ? ? ?Assist   ? ?Assist level: Independent with assistive device ?Assistive  device: Other (comment) ?  ? ?Walk 10 feet on uneven surface  ?activity ? ? ?Assist   ? ? ?Assist level: Independent with assistive device ?Assistive device: Other (comment)  ? ?Wheelchair ? ? ? ? ?Assist Is the patient using a wheelchair?: No ?  ?Wheelchair activity did not occur: N/A ? ?  ?   ? ? ?Wheelchair 50 feet with 2 turns activity ? ? ? ?Assist ? ?  ?Wheelchair 50 feet with 2 turns activity did not occur: N/A ? ? ?   ? ?Wheelchair 150 feet activity  ? ? ? ?Assist ? Wheelchair 150 feet activity did not occur: N/A ? ? ?   ? ?Blood pressure 129/90, pulse 67, temperature 98.3 ?F (36.8 ?C), resp. rate 18, height 5\' 6"  (1.676 m), weight 62.1 kg, SpO2 100 %. ? ? ? Medical Problem List and Plan: ?1. Functional deficits secondary to  debility secondary to cardiac arrest with mild cognitive impairment, acute systolic HF ?            -patient may shower ?            -ELOS/Goals: 10-14 days S ?            d/c home tomorrow ?2.  Antithrombotics: ?-DVT/anticoagulation:  Pharmaceutical: Lovenox ?            -antiplatelet therapy: none ?3. Pain Management: Tylenol prn ?4. Mood: LCSW to evaluate and provide emotional support ?            -antipsychotic agents: n/a (Buspar and Wellbutrin on med list; NOT being given) ?5. Neuropsych: This patient is capable of making decisions on her own behalf. ?6. Skin/Wound Care: Routine skin care checks ?7. 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            --continue Coreg 12.5 mg BID ?            --continue Aldactone 25 mg daily ?            --continue Entresto 24-26 mg BID ?            --continue Jardiance 10 mg daily ?            --daily weight ?9: Hypokalemia: supplement BID and repeat potassium level tomorrow.  ?10: Alcohol abuse: cessation counseling. Will set up without outpatient AA ?11: Mild cognitive impairment: SLP eval ordered. Discuss with neurology whether Keppra can be weaned ?12: Congenital blindness/glaucoma:                          --continue Cosopt drops left eye ?            --has right eye prosthesis ?13. Vitamin D deficiency: start ergocalciferol 50,000U once per week for 7 weeks ?14. Insomnia: start trazodone 50mg  daily prn HS. Continue Benadryl prn ? ? ?LOS: ?2 days ?A FACE TO FACE EVALUATION WAS PERFORMED ? ? P Chaelyn Bunyan ?05/22/2021, 1:18 PM  ? ?  ?

## 2021-05-22 NOTE — Progress Notes (Signed)
Physical Therapy Session Note ? ?Patient Details  ?Name: Beth Barnes ?MRN: [id number] ?Date of Birth: May 17, 1995 ? ?Today's Date: 05/22/2021 ?PT Individual Time: 2229-7989 ?PT Individual Time Calculation (min): 72 min  ? ?Short Term Goals: ?Week 1:  PT Short Term Goal 1 (Week 1): STG=LTG due to ELOS ? ?Skilled Therapeutic Interventions/Progress Updates:  ?   ?Pt sitting EOB to start - agreeable to PT tx and denies pain. Pt is blind and use a walking cane to assist with visibility and safety during gait. Sit<>stand mod I and ambulated ~227f on rehab hallways mod I with walking cane - only cues needed for occasional guidance due to unfamiliar environment. Pt reports she doesn't use the walking cane at home due to familiarity, only uses when going outdoors or shopping. We ambulated outdoors on concrete and practiced transfers to park benches and chairs without arm rests - she was able to manage this well without cues and good safety awareness. In total, she ambulated >20035fduring session mod I with walking cane.  ? ?Discussed home safety, modifications at home for the blind, hobbies to explore, etc. Pt seemingly aware of deficits and limitations, open about her struggles with alcoholism and keeping a job.  ? ?We practiced navigating the gift shop downstairs to address community reintegration - pt reports she uses a "shopping aid" from stores that she shops at, used to help locate items.  ? ?We practiced navigating steps in the hospital which she did mod I using R hand rail and her walking cane, +15 steps up/down with reciprocal stepping.  ? ?Pt returned to room and remained seated EOB at end of session, all needs met.  ? ?Therapy Documentation ?Precautions:  ?Precautions ?Precautions: Fall ?Precaution Comments: legally blind ?Restrictions ?Weight Bearing Restrictions: No ?General: ?  ? ?Therapy/Group: Individual Therapy ? ?ChAlger SimonsT ?05/22/2021, 7:24 AM  ?

## 2021-05-22 NOTE — Progress Notes (Signed)
Occupational Therapy Session Note ? ?Patient Details  ?Name: Beth Barnes ?MRN: [id number] ?Date of Birth: 1995/07/03 ? ?Today's Date: 05/22/2021 ?OT Individual Time: 9702-6378 ?OT Individual Time Calculation (min): 35 min  ? ? ?Short Term Goals: ?Week 1:  OT Short Term Goal 1 (Week 1): STGs = LTGs ? ?Skilled Therapeutic Interventions/Progress Updates:  ?Patient met lying horizontally in bed in agreement with OT treatment session. Denies pain at rest and with activity. Short OT treatment session with focus on STM. Patient able to navigate to ortho gym with intermittent verbal cues to navigate around obstacles. Gave patient sequence of 3 colors. Able to recall 3/3 colors immediately and only 3/3 with 2 minute delay. With sequence of 5 colors patient able to recall 5/5 immediately.Recalled 0/5 correctly after 2 minute delay. Returned to hospital room in same manner as indicated above. Session concluded with patient seated EOB with call bell within reach, bed alarm activated and all needs met.  ? ?Therapy Documentation ?Precautions:  ?Precautions ?Precautions: Fall ?Precaution Comments: legally blind ?Restrictions ?Weight Bearing Restrictions: No ?General: ?  ? ?Therapy/Group: Individual Therapy ? ?Brenton Joines R Howerton-Davis ?05/22/2021, 12:19 PM ?

## 2021-05-22 NOTE — Discharge Instructions (Addendum)
Inpatient Rehab Discharge Instructions ? ?Beth Barnes ?Discharge date and time: 05/23/2021  ? ?Activities/Precautions/ Functional Status: ?Activity: no lifting, driving, or strenuous exercise for until cleared by MD ?Diet: cardiac diet ?Wound Care: none needed ?Functional status:  ?___ No restrictions     ___ Walk up steps independently ?___ 24/7 supervision/assistance   ___ Walk up steps with assistance ?___ Intermittent supervision/assistance  ___ Bathe/dress independently ?___ Walk with walker     ___ Bathe/dress with assistance ?___ Walk Independently    ___ Shower independently ?___ Walk with assistance    __x_ Shower with assistance ?__x_ No alcohol     ___ Return to work/school ________ ? ?Special Instructions: ? ?No driving, alcohol consumption or tobacco use.  ? ? ?GENERAL COMMUNITY RESOURCES FOR PATIENT/FAMILY: ?Support Groups:  [facility] SUPPORT GROUPS IN [place] EMAILED TO PT'S PHONE ?Mental Health: [phone] ? ?My questions have been answered and I understand these instructions. I will adhere to these goals and the provided educational materials after my discharge from the hospital. ? ?Patient/Caregiver Signature _______________________________ Date __________ ? ?Clinician Signature _______________________________________ Date __________ ? ?Please bring this form and your medication list with you to all your follow-up doctor's appointments.   ?

## 2021-05-22 NOTE — Progress Notes (Addendum)
Patient ID: Beth Barnes, female   DOB: 12-18-1995, 26 y.o.   MRN: [id number]  Team and MD feel pa has reached her goals of mod/I level and ready for discharge tomorrow. Will contact Mom to make aware and let pt know of plan for tomorrow. ? ?12:11 PM Have informed pt of plan for tomorrow. Have not be able to reach her Mom. ? ?12:54 PM Met with pt to see if the number for her Mom is correct and she voiced it is. Spoke with Mom to give her the update regarding readiness for discharge tomorrow. She and pt will discuss place she is going too.  ?

## 2021-05-22 NOTE — Progress Notes (Signed)
Inpatient Rehabilitation Care Coordinator ?Discharge Note  ? ?Patient Details  ?Name: Beth Barnes ?MRN: [id number] ?Date of Birth: 01/17/1996 ? ? ?Discharge location: HOME WITH BOYFRIEND ? ?Length of Stay: 3 days ? ?Discharge activity level: independent with her cane ? ?Home/community participation: active ? ?Patient response [id number] Literacy - How often do you need to have someone help you when you read instructions, pamphlets, or other written material from your doctor or pharmacy?: Sometimes ? ?Patient response [id number] Isolation - How often do you feel lonely or isolated from those around you?: Never ? ?Services provided included: MD, RD, PT, OT, SLP, RN, CM, Pharmacy, Neuropsych, SW ? ?Financial Services:  ?Charity fundraiser Utilized: Medicaid ?  ? ?Choices offered to/list presented to: pt ? ?Follow-up services arranged:  ?Mental Health services, Other (Comment) ([facility] Support Groups) ?   ?  ?  ?  ? ?Patient response to transportation need: ?Is the patient able to respond to transportation needs?: Yes ?In the past 12 months, has lack of transportation kept you from medical appointments or from getting medications?: No ?In the past 12 months, has lack of transportation kept you from meetings, work, or from getting things needed for daily living?: No ? ? ? ?Comments (or additional information):was high level when she came to rehab. Emailed her [place] meetings and ads services she can follow up with for OP counseling. Aware needs to change her lifestyle so as to prevent another health issue ? ?Patient/Family verbalized understanding of follow-up arrangements:  Yes ? ?Individual responsible for coordination of the follow-up plan: self [phone] ? ?Confirmed correct DME delivered: Elease Hashimoto 05/22/2021   ? ?Elease Hashimoto ?

## 2021-05-22 NOTE — Progress Notes (Signed)
Inpatient Rehabilitation Discharge Medication Review by a Pharmacist ? ?A complete drug regimen review was completed for this patient to identify any potential clinically significant medication issues. ? ?High Risk Drug Classes Is patient taking? Indication by Medication  ?Antipsychotic No   ?Anticoagulant No   ?Antibiotic No   ?Opioid No   ?Antiplatelet No   ?Hypoglycemics/insulin Yes Farxiga- HF  ?Vasoactive Medication Yes Entresto, Spironolactone, Coreg- HF  ?Chemotherapy No   ?Other Yes Keppra- seizure prophylaxis  ? ? ? ?Type of Medication Issue Identified Description of Issue Recommendation(s)  ?Drug Interaction(s) (clinically significant) ?    ?Duplicate Therapy ?    ?Allergy ?    ?No Medication Administration End Date ?    ?Incorrect Dose ?    ?Additional Drug Therapy Needed ?    ?Significant med changes from prior encounter (inform family/care partners about these prior to discharge).    ?Other ?    ? ? ?Clinically significant medication issues were identified that warrant physician communication and completion of prescribed/recommended actions by midnight of the next day:  No ? ?Time spent performing this drug regimen review (minutes):  30 ? ? ?Naomii Kreger BS, PharmD, BCPS ?Clinical Pharmacist ?05/23/2021 10:17 AM ? ?Contact: [phone] after 3 PM ? ?"Be curious, not judgmental..." -Debbora Dus ?

## 2021-05-22 NOTE — Progress Notes (Signed)
Slept well last night. Patient states she doesn't like taking Melatonin as sleep aid and would prefer benadryl. Morning staff to be informed to notify team for further consideration. Denies apin. No new changes to report. Safety maintained . ?

## 2021-05-22 NOTE — Discharge Summary (Signed)
Physical Therapy Discharge Summary ? ?Patient Details  ?Name: Beth Barnes ?MRN: [id number] ?Date of Birth: 08/18/1995 ? ?Patient has met 9 of 9 long term goals due to improved activity tolerance, improved balance, and ability to compensate for deficits.  Patient to discharge at an ambulatory level Modified Independent.   Patient's care partner is independent to provide the necessary physical and cognitive assistance at discharge. ? ?Reasons goals not met: n/a ? ?Recommendation:  ?No PT follow up recommendations.  ? ?Equipment: ?No equipment provided ? ?Reasons for discharge: treatment goals met and discharge from hospital ? ?Patient/family agrees with progress made and goals achieved: Yes ? ?PT Discharge ?Precautions/Restrictions ?Precautions ?Precautions: Fall ?Precaution Comments: legally blind ?Restrictions ?Weight Bearing Restrictions: No ?Pain ?Pain Assessment ?Pain Scale: 0-10 ?Pain Score: 0-No pain ?Pain Interference ?Pain Interference ?Pain Effect on Sleep: 0. Does not apply - I have not had any pain or hurting in the past 5 days ?Pain Interference with Therapy Activities: 0. Does not apply - I have not received rehabilitationtherapy in the past 5 days ?Pain Interference with Day-to-Day Activities: 1. Rarely or not at all ?Vision/Perception  ?Vision - History ?Ability to See in Adequate Light: 4 Severely impaired ?Vision - Assessment ?Additional Comments: pt legally blind at baseline; no change reported therefore assessment not warranted ?Perception ?Perception: Within Functional Limits ?Praxis ?Praxis: Intact  ?Cognition ?Overall Cognitive Status: Impaired/Different from baseline ?Arousal/Alertness: Awake/alert ?Orientation Level: Oriented X4 ?Memory: Appears intact ?Awareness: Appears intact ?Problem Solving: Appears intact ?Safety/Judgment: Appears intact ?Sensation ?Sensation ?Light Touch: Appears Intact ?Hot/Cold: Appears Intact ?Proprioception: Appears Intact ?Stereognosis: Appears  Intact ?Coordination ?Gross Motor Movements are Fluid and Coordinated: Yes ?Fine Motor Movements are Fluid and Coordinated: Yes ?Heel Shin Test: WFL BLE ?Motor  ?Motor ?Motor: Within Functional Limits  ?Mobility ?Bed Mobility ?Bed Mobility: Supine to Sit;Sit to Supine ?Supine to Sit: Independent ?Sit to Supine: Independent ?Transfers ?Transfers: Sit to Stand;Stand to Sit;Stand Pivot Transfers ?Sit to Stand: Independent ?Stand to Sit: Independent ?Stand Pivot Transfers: Independent ?Transfer (Assistive device): None ?Locomotion  ?Gait ?Ambulation: Yes ?Gait Assistance: Independent with assistive device ?Gait Distance (Feet): 1000 Feet ?Assistive device: Other (Comment) (walking cane) ?Gait ?Gait: Yes ?Gait Pattern: Within Functional Limits ?Stairs / Additional Locomotion ?Stairs: Yes ?Stairs Assistance: Independent with assistive device ?Stair Management Technique: One rail Right;With cane ?Number of Stairs: 15 ?Height of Stairs: 6 ?Wheelchair Mobility ?Wheelchair Mobility: No  ?Trunk/Postural Assessment  ?Cervical Assessment ?Cervical Assessment: Within Functional Limits ?Thoracic Assessment ?Thoracic Assessment: Within Functional Limits ?Lumbar Assessment ?Lumbar Assessment: Within Functional Limits ?Postural Control ?Postural Control: Within Functional Limits  ?Balance ?Balance ?Balance Assessed: Yes ?Static Sitting Balance ?Static Sitting - Balance Support: Feet supported;No upper extremity supported ?Static Sitting - Level of Assistance: 7: Independent ?Dynamic Sitting Balance ?Dynamic Sitting - Balance Support: During functional activity;No upper extremity supported ?Dynamic Sitting - Level of Assistance: 7: Independent ?Static Standing Balance ?Static Standing - Balance Support: During functional activity;No upper extremity supported ?Static Standing - Level of Assistance: 7: Independent ?Dynamic Standing Balance ?Dynamic Standing - Balance Support: No upper extremity supported;During functional  activity ?Dynamic Standing - Level of Assistance: 6: Modified independent (Device/Increase time) ?Extremity Assessment  ?RUE Assessment ?RUE Assessment: Within Functional Limits ?LUE Assessment ?LUE Assessment: Within Functional Limits ?RLE Assessment ?RLE Assessment: Within Functional Limits ?LLE Assessment ?LLE Assessment: Within Functional Limits ? ? ? ? P  PT ?05/22/2021, 12:17 PM ?

## 2021-05-22 NOTE — Plan of Care (Signed)
?  Problem: Consults ?Goal: RH GENERAL PATIENT EDUCATION ?Description: See Patient Education module for education specifics. ?Outcome: Progressing ?  ?Problem: RH BOWEL ELIMINATION ?Goal: RH STG MANAGE BOWEL WITH ASSISTANCE ?Description: STG Manage Bowel with mod I  Assistance. ?Outcome: Progressing ?Goal: RH STG MANAGE BOWEL W/MEDICATION W/ASSISTANCE ?Description: STG Manage Bowel with Medication with mod I Assistance. ?Outcome: Progressing ?  ?Problem: RH SAFETY ?Goal: RH STG ADHERE TO SAFETY PRECAUTIONS W/ASSISTANCE/DEVICE ?Description: STG Adhere to Safety Precautions With cues Assistance/Device. ?Outcome: Progressing ?  ?Problem: RH PAIN MANAGEMENT ?Goal: RH STG PAIN MANAGED AT OR BELOW PT'S PAIN GOAL ?Description: At or below level 4 with prns ?Outcome: Progressing ?  ?Problem: RH KNOWLEDGE DEFICIT GENERAL ?Goal: RH STG INCREASE KNOWLEDGE OF SELF CARE AFTER HOSPITALIZATION ?Description: Patient and mother will be able to manage care at discharge using information independently ?Outcome: Progressing ?  ?Problem: Education: ?Goal: Ability to demonstrate management of disease process will improve ?Description: Manage HF with medications and dietary modification independently ?Outcome: Progressing ?Goal: Ability to verbalize understanding of medication therapies will improve ?Description: Patient will be able to manage meds independently ?Outcome: Progressing ?Goal: Individualized Educational Video(s) ?Outcome: Progressing ?  ?

## 2021-05-22 NOTE — Progress Notes (Signed)
Occupational Therapy Discharge Summary ? ?Patient Details  ?Name: Beth Barnes ?MRN: [id number] ?Date of Birth: 1995-02-12 ? ?Today's Date: 05/22/2021 ?OT Individual Time: [id number] ?OT Individual Time Calculation (min): 45 min  ? ?Skilled Intervention:  Pt sitting EOB eating breakfast and requesting that therapist return in 30 minutes to allow her to finish meal.  Upon returning, pt requesting to shower and change clothes.  Pt completed all self care and mobility with independence including the following:  Sit to stand from EOB, ambulating to bathroom, toilet transfer and toileting, ambulated a few feet to shower, UB/LB bathing in standing, dried off, ambulated to EOB, dressed UB and LB. Direct hand off to nurse at end of session. ? ? ?Patient has met 4 of 4 long term goals due to improved activity tolerance and ability to compensate for deficits.  Patient to discharge at overall Independent level. Patient verbalizes awareness of need for management of alcohol addiction and she requested resources including alcoholics anonymous and yoga instruction and plans to follow up with this.  Pt also verbalizes good higher level problem solving skills in regards to IADL completion with good knowledge on reintegrating into the community while compensating for legal blindness.  Pt completed all in- room functional mobility and self care including shower level bathing while standing and all ADL item retrieval with independence.  Pt navigates her new environment well and safely and anticipate this will translate well to her familiar environment. Patient plans to return to house, living with boyfriend who works remotely and will be available as needed, however patient is overall independent and does not require assist.   ? ?Reasons goals not met: NA all goals met ? ?Recommendation:  ?Patient will not require further skilled OT after discharge from CIR. ? ?Equipment: ?none ? ?Reasons for discharge: treatment goals met and discharge  from hospital ? ?Patient/family agrees with progress made and goals achieved: Yes ? ?OT Discharge ?Precautions/Restrictions  ?Precautions ?Precautions: Fall ?Precaution Comments: legally blind ?Restrictions ?Weight Bearing Restrictions: No ?Pain ?Pain Assessment ?Pain Scale: 0-10 ?Pain Score: 0-No pain ?ADL ?ADL ?Eating: Independent ?Grooming: Independent ?Upper Body Bathing: Independent ?Where Assessed-Upper Body Bathing: Shower ?Lower Body Bathing: Independent ?Where Assessed-Lower Body Bathing: Shower ?Upper Body Dressing: Independent ?Lower Body Dressing: Independent ?Toileting: Independent ?Where Assessed-Toileting: Toilet ?Toilet Transfer: Independent ?Toilet Transfer Method: Ambulating ?Walk-In Shower Transfer: Independent ?Walk-In Shower Transfer Method: Ambulating ?Walk-In Shower Equipment: Other (comment) (no equipment) ?Vision ?Baseline Vision/History: 2 Legally blind ?Patient Visual Report: No change from baseline;Other (comment) ?Additional Comments: pt legally blind at baseline; no change reported therefore assessment not warranted ?Perception  ?Perception: Within Functional Limits ?Praxis ?Praxis: Intact ?Cognition ?Cognition ?Overall Cognitive Status: Impaired/Different from baseline ?Arousal/Alertness: Awake/alert ?Orientation Level: Person;Place;Situation ?Person: Oriented ?Place: Oriented ?Situation: Oriented ?Memory: Appears intact ?Awareness: Appears intact ?Problem Solving: Appears intact ?Safety/Judgment: Appears intact ?Brief Interview for Mental Status (BIMS) ?Repetition of Three Words (First Attempt): 3 ?Temporal Orientation: Year: Correct ?Temporal Orientation: Month: Accurate within 5 days ?Temporal Orientation: Day: Correct ?Recall: "Sock": Yes, no cue required ?Recall: "Blue": Yes, no cue required ?Recall: "Bed": Yes, no cue required ?BIMS Summary Score: 15 ?Sensation ?Sensation ?Light Touch: Appears Intact ?Hot/Cold: Appears Intact ?Proprioception: Appears Intact ?Stereognosis: Not  tested ?Coordination ?Gross Motor Movements are Fluid and Coordinated: Yes ?Fine Motor Movements are Fluid and Coordinated: Yes ?Motor  ?Motor ?Motor: Within Functional Limits ?Mobility  ?Bed Mobility ?Supine to Sit: Independent ?Sit to Supine: Independent ?Transfers ?Sit to Stand: Independent ?Stand to Sit: Independent  ?Trunk/Postural Assessment  ?Cervical Assessment ?Cervical  Assessment: Within Functional Limits ?Thoracic Assessment ?Thoracic Assessment: Within Functional Limits ?Lumbar Assessment ?Lumbar Assessment: Within Functional Limits ?Postural Control ?Postural Control: Within Functional Limits  ?Balance ?Balance ?Balance Assessed: Yes ?Static Sitting Balance ?Static Sitting - Balance Support: Feet supported;No upper extremity supported ?Static Sitting - Level of Assistance: 7: Independent ?Dynamic Sitting Balance ?Dynamic Sitting - Balance Support: During functional activity;No upper extremity supported ?Dynamic Sitting - Level of Assistance: 7: Independent ?Static Standing Balance ?Static Standing - Balance Support: During functional activity;No upper extremity supported ?Static Standing - Level of Assistance: 7: Independent ?Dynamic Standing Balance ?Dynamic Standing - Balance Support: No upper extremity supported;During functional activity ?Dynamic Standing - Level of Assistance: 6: Modified independent (Device/Increase time) ?Extremity/Trunk Assessment ?RUE Assessment ?RUE Assessment: Within Functional Limits ?LUE Assessment ?LUE Assessment: Within Functional Limits ? ? ?Caryl Asp Luca Burston ?05/22/2021, 11:53 AM ?

## 2021-05-22 NOTE — Progress Notes (Signed)
Patient ID: Beth Barnes, female   DOB: Dec 08, 1995, 26 y.o.   MRN: [id number] ?Met with the patient and her mother to introduce self, review rehab process, team conference and plan of care. Reviewed medications and secondary risk management. Noted education on medications per pharmacy and gave recommendations for management of timing of medications and zone tool for heart failure. Patient noted preference to receive information via email so that she can have a copy and hear info rather than paper/forms for others to read to her. Continue to follow along to discharge to address educational needs to facilitate preparation for discharge. Beth Barnes ? ?

## 2021-05-23 ENCOUNTER — Other Ambulatory Visit (HOSPITAL_COMMUNITY): Payer: Self-pay

## 2021-05-23 LAB — BASIC METABOLIC PANEL
Anion gap: 6 (ref 5–15)
BUN: 5 mg/dL — ABNORMAL LOW (ref 6–20)
CO2: 22 mmol/L (ref 22–32)
Calcium: 8.9 mg/dL (ref 8.9–10.3)
Chloride: 108 mmol/L (ref 98–111)
Creatinine, Ser: 0.56 mg/dL (ref 0.44–1.00)
GFR, Estimated: >60 mL/min (ref >60)
Glucose, Bld: 87 mg/dL (ref 70–99)
Potassium: 3.5 mmol/L (ref 3.5–5.1)
Sodium: 136 mmol/L (ref 135–145)

## 2021-05-23 MED ORDER — TRAZODONE HCL 50 MG PO TABS
50.0000 mg | ORAL_TABLET | Freq: Every evening | ORAL | 0 refills | Status: DC | PRN
Start: 2021-05-23 — End: 2021-06-03
  Filled 2021-05-23: qty 30, 30d supply, fill #0

## 2021-05-23 MED ORDER — POTASSIUM CHLORIDE CRYS ER 20 MEQ PO TBCR
20.0000 meq | EXTENDED_RELEASE_TABLET | Freq: Every day | ORAL | 0 refills | Status: DC
  Filled 2021-05-23: qty 30, 30d supply, fill #0

## 2021-05-23 NOTE — Progress Notes (Signed)
?                                                       PROGRESS NOTE ? ? ?Subjective/Complaints: ?Ready for d/c today ?Discussed that we would provide with Alcoholics anonymous resources ?Discussed that keppra could contribute to her cognitive deficits ? ?ROS: +insomnia, +congenital blindness, +impaired cognition from baseline as per mom ? ?Objective: ?  ?No results found. ?Recent Labs  ?  05/21/21 ?[id number]  ?WBC 5.7  ?HGB 10.8*  ?HCT 34.1*  ?PLT 350  ? ?Recent Labs  ?  05/22/21 ?0637 05/23/21 ?[id number]  ?NA 137 136  ?K 3.1* 3.5  ?CL 107 108  ?CO2 23 22  ?GLUCOSE 94 87  ?BUN <5* 5*  ?CREATININE 0.63 0.56  ?CALCIUM 9.0 8.9  ? ? ?Intake/Output Summary (Last 24 hours) at 05/23/2021 1124 ?Last data filed at 05/22/2021 2102 ?Gross per 24 hour  ?Intake 240 ml  ?Output --  ?Net 240 ml  ?  ? ?  ? ?Physical Exam: ?Vital Signs ?Blood pressure 101/62, pulse 60, temperature 98.2 ?F (36.8 ?C), resp. rate 16, height 5\' 6"  (1.676 m), weight 63.6 kg, SpO2 100 %. ? ?Gen: no distress, normal appearing ?HEENT: left nystagmus, congenital blindness ?Cardio: Reg rate ?Chest: normal effort, normal rate of breathing ?Abd: soft, non-distended ?Ext: no edema ?Psych: pleasant, normal affect ?Skin: intact ?Neurological:  ?   General: No focal deficit present.  ?   Mental Status: She is alert and oriented to person, place, and time. No focal deficits ?Ambulating modI ?Standing and packing her back independently ?  ? ?Assessment/Plan: ?1. Functional deficits which require 3+ hours per day of interdisciplinary therapy in a comprehensive inpatient rehab setting. ?Physiatrist is providing close team supervision and 24 hour management of active medical problems listed below. ?Physiatrist and rehab team continue to assess barriers to discharge/monitor patient progress toward functional and medical goals ? ?Care Tool: ? ?Bathing ?   ?Body parts bathed by patient: Right arm, Left arm, Chest, Abdomen, Front perineal area, Buttocks, Right upper leg, Left upper  leg, Face, Left lower leg, Right lower leg  ?   ?  ?  ?Bathing assist Assist Level: Independent ?  ?  ?Upper Body Dressing/Undressing ?Upper body dressing   ?What is the patient wearing?: Pull over shirt, Bra ?   ?Upper body assist Assist Level: Independent ?   ?Lower Body Dressing/Undressing ?Lower body dressing ? ? ?   ?What is the patient wearing?: Underwear/pull up, Pants ? ?  ? ?Lower body assist Assist for lower body dressing: Independent ?   ? ?Toileting ?Toileting    ?Toileting assist Assist for toileting: Independent ?  ?  ?Transfers ?Chair/bed transfer ? ?Transfers assist ?   ? ?Chair/bed transfer assist level: Independent with assistive device ?Chair/bed transfer assistive device: Cane ?  ?Locomotion ?Ambulation ? ? ?Ambulation assist ? ?   ? ?Assist level: Independent with assistive device ?Assistive device: Other (comment) (walking cane) ?Max distance: 1067ft  ? ?Walk 10 feet activity ? ? ?Assist ?   ? ?Assist level: Independent with assistive device ?Assistive device: Other (comment)  ? ?Walk 50 feet activity ? ? ?Assist   ? ?Assist level: Independent with assistive device ?Assistive device: Other (comment)  ? ? ?Walk 150 feet activity ? ? ?Assist   ? ?Assist level:  Independent with assistive device ?Assistive device: Other (comment) ?  ? ?Walk 10 feet on uneven surface  ?activity ? ? ?Assist   ? ? ?Assist level: Independent with assistive device ?Assistive device: Other (comment)  ? ?Wheelchair ? ? ? ? ?Assist Is the patient using a wheelchair?: No ?  ?Wheelchair activity did not occur: N/A ? ?  ?   ? ? ?Wheelchair 50 feet with 2 turns activity ? ? ? ?Assist ? ?  ?Wheelchair 50 feet with 2 turns activity did not occur: N/A ? ? ?   ? ?Wheelchair 150 feet activity  ? ? ? ?Assist ? Wheelchair 150 feet activity did not occur: N/A ? ? ?   ? ?Blood pressure 101/62, pulse 60, temperature 98.2 ?F (36.8 ?C), resp. rate 16, height 5\' 6"  (1.676 m), weight 63.6 kg, SpO2 100 %. ? ? ? Medical Problem List and  Plan: ?1. Functional deficits secondary to debility secondary to cardiac arrest with mild cognitive impairment, acute systolic HF ?            -patient may shower ?            -ELOS/Goals: 3 days modI ?            d/c home today ?2.  Antithrombotics: ?-DVT/anticoagulation:  Pharmaceutical: Lovenox ?            -antiplatelet therapy: none ?3. Pain Management: Tylenol prn ?4. Mood: LCSW to evaluate and provide emotional support ?            -antipsychotic agents: n/a (Buspar and Wellbutrin on med list; NOT being given) ?5. Neuropsych: This patient is capable of making decisions on her own behalf. ?6. Skin/Wound Care: Routine skin care checks ?7. 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            --continue Coreg 12.5 mg BID ?            --continue Aldactone 25 mg daily ?            --continue Entresto 24-26 mg BID ?            --continue Jardiance 10 mg daily ?            --daily weight ?9: Hypokalemia: supplement 102meq BID and repeat potassium level tomorrow.  ?10: Alcohol abuse: cessation counseling. Will set up without outpatient AA, discussed with patient. ?11: Mild cognitive impairment: SLP eval ordered. Discuss with neurology whether Keppra can be weaned. Discussed with patient that keppra can contribute to cognitive deficits ?12: Congenital blindness/glaucoma:                          --continue Cosopt drops left eye ?            --has right eye prosthesis ?13. Vitamin D deficiency: start ergocalciferol 50,000U once per week for 7 weeks ?14. Insomnia: start trazodone 50mg  daily prn HS. Continue Benadryl prn ? ? >30 minutes spent in discharge of patient including review of medications and follow-up appointments, physical examination, and in answering all patient's questions  ? ?LOS: ?3 days ?A FACE TO FACE EVALUATION WAS PERFORMED ? ?Beth Barnes ?05/23/2021, 11:24 AM  ? ?  ?

## 2021-05-23 NOTE — Consult Note (Signed)
?  Attempted to meet with the patient at 8 AM.  Knocked on the door with no response, entered the room.  Lights were out, patient was in bed asleep and her boyfriend was asleep in the reclining chair.  The patient did not respond to direct verbal request and the boyfriend simply crumbled in the chair without opening eyes stating that it was too early.  I again spoke to the patient with no response. ?

## 2021-05-23 NOTE — Plan of Care (Signed)
Care plan resolved.

## 2021-05-23 NOTE — Discharge Summary (Addendum)
Physician Discharge Summary  ?Patient ID: ?Beth Barnes ?MRN: [id number] ?DOB/AGE: Dec 11, 1995 25 y.o. ? ?Admit date: 05/20/2021 ?Discharge date: 05/23/2021 ? ?Discharge Diagnoses:  ?Principal Problem: ?  Heart failure (HCC) ?Active Problems: ?  Acute systolic heart failure (HCC) ?Mild cognitive impairment ?Congenital blindness ?Alcohol abuse ?Vitamin D deficiency ? ?Discharged Condition: stable ? ?Significant Diagnostic Studies: ?DG Abd 1 View ? ?Result Date: 05/13/2021 ?CLINICAL DATA:  Nasogastric tube placement EXAM: ABDOMEN - 1 VIEW COMPARISON:  None. FINDINGS: Distal tip of nasogastric tube is seen in distal stomach. IMPRESSION: Distal tip of nasogastric tube seen in distal stomach. Electronically Signed   By: Lupita Raider M.D.   On: 05/13/2021 18:12  ? ?CT HEAD WO CONTRAST ( ) ? ?Result Date: 05/13/2021 ?CLINICAL DATA:  Acute neurological deficit.  Stroke suspected. EXAM: CT HEAD WITHOUT CONTRAST TECHNIQUE: Contiguous axial images were obtained from the base of the skull through the vertex without intravenous contrast. RADIATION DOSE REDUCTION: This exam was performed according to the departmental dose-optimization program which includes automated exposure control, adjustment of the mA and/or kV according to patient size and/or use of iterative reconstruction technique. COMPARISON:  None. FINDINGS: Brain: No evidence of acute infarction, hemorrhage, hydrocephalus, extra-axial collection or mass lesion/mass effect. Vascular: No hyperdense vessel or unexpected calcification. Skull: Normal. Negative for fracture or focal lesion. Sinuses/Orbits: Paranasal sinuses are clear. Right ocular prosthesis is suggested. Other: None. IMPRESSION: No acute intracranial abnormalities. Electronically Signed   By: Burman Nieves M.D.   On: 05/13/2021 21:53  ? ?DG CHEST PORT 1 VIEW ? ?Result Date: 05/14/2021 ?CLINICAL DATA:  Cardiac arrest EXAM: PORTABLE CHEST 1 VIEW COMPARISON:  05/13/2021 FINDINGS: Endotracheal tube, nasogastric  tube, are unchanged. Pulmonary insufflation is stable. Extensive bilateral upper lung zone airspace infiltrate appears stable. There is progressive infiltrate within the left lower lung zone. No pneumothorax or pleural effusion. Cardiac size within normal limits. No acute bone abnormality. IMPRESSION: Stable support tubes. Mild pulmonary hypoinflation, stable. Progressive extensive asymmetric airspace infiltrate in keeping with asymmetric pulmonary edema or infection. Electronically Signed   By: Helyn Numbers M.D.   On: 05/14/2021 01:50  ? ?DG Chest Port 1 View ? ?Result Date: 05/13/2021 ?CLINICAL DATA:  Endotracheal tube. EXAM: PORTABLE CHEST 1 VIEW COMPARISON:  Chest x-ray 11/30/2020. FINDINGS: Endotracheal tube tip is approximately 2.9 cm above the carina. Enteric tube extends below the diaphragm. Lines and tubes overlie the chest. There is dense airspace disease in the bilateral upper lobes, left greater than right. Costophrenic angles are clear. No pneumothorax or pleural effusion identified. Cardiomediastinal silhouette is within normal limits. Osseous structures are within normal limits. IMPRESSION: 1. Endotracheal tube tip 2.9 cm above the carina. 2. Bilateral upper lobe airspace disease, left greater than right. Findings may be related to infection or hemorrhage. Correlate clinically. Electronically Signed   By: Darliss Cheney M.D.   On: 05/13/2021 18:08  ? ?MR CARDIAC MORPHOLOGY W WO CONTRAST ? ?Result Date: 05/20/2021 ?CLINICAL DATA:  Sudden Cardiac Death/ Cardiomyopathy EXAM: CARDIAC MRI TECHNIQUE: The patient was scanned on a 1.5 Tesla Siemens magnet. A dedicated cardiac coil was used. Functional imaging was done using Fiesta sequences. 2,3, and 4 chamber views were done to assess for RWMA's. Modified Simpson's rule using a short axis stack was used to calculate an ejection fraction on a dedicated work [employer]. The patient received 8 cc of Gadavist. After 10 minutes inversion recovery  sequences were used to assess for infiltration and scar tissue. CONTRAST:  [id number] FINDINGS: Normal atrial  sizes. No ASD/PFO. Small ascending thoracic aorta 2.2 cm Small nearly circumferential pericardial effusion. Normal cardiac valves. Tri leaflet AV Normal RV size and function. Mild LVE. Mild global hypokinesis. No defined RWMAls. Quantitative EF 42% (EDV 89 cc ESV 52 cc SV 37 cc) Delayed enhancement images show non specific mild stripe of mid myocardial uptake diffusely Normal parametric findings T2 55 msec T1 1181 msec ECV 31% IMPRESSION: 1. Mild septal hypertrophy 14 mm with mild global hypokinesis EF 42% 2. Nonspecific mid myocardial gadolinium uptake on delayed inversion recovery sequences 3. Normal parametric measures no evidence of myocarditis or infiltration or edema 4.  Normal RV size and function 5.  Normal cardiac valves 6.  Small but nearly circumferential pericardial effusion Charlton Haws Electronically Signed   By: Charlton Haws M.D.   On: 05/20/2021 09:47  ? ?EEG adult ? ?Result Date: 05/14/2021 ?Charlsie Quest, MD     05/14/2021 10:35 AM Patient Name: Beth Barnes Epilepsy Attending: Charlsie Quest Referring Physician/Provider: Lorin Glass, MD Date: 05/14/2021 Duration: 23.21 mins Patient history: 26 year old woman with hx of blindness, polysubstance abuse who was [place] boyfriend [place] and found unresponsive, unknown downtime. Now with myoclonus. EEG to evaluate for seizure  Level of alertness:  comatose  AEDs during EEG study: propofol, versed, LEV Technical aspects: This EEG study was done with scalp electrodes positioned according to the 10-20 International system of electrode placement. Electrical activity was acquired at a sampling rate of 500Hz  and reviewed with a high frequency filter of 70Hz  and a low frequency filter of 1Hz . EEG data were recorded continuously and digitally stored. Description: EEG showed continuous generalized polymorphic mixed frequencies with  predominantly 3 to 7 Hz theta-delta slowing admixed with intermittent generalized 15 to 18 Hz beta activity.  Hyperventilation and photic stimulation were not performed.   ABNORMALITY - Continuous slow, generalized IMPRESSION: This study is suggestive of severe diffuse encephalopathy, nonspecific etiology.  No seizures or epileptiform discharges were seen throughout the recording. Priyanka  ? ?Overnight EEG with video ? ?Result Date: 05/15/2021 ? , MD     05/15/2021  1:56 PM Patient Name: Beth Barnes MRN: Charlsie Quest Epilepsy Attending: 07/15/2021 Referring Physician/Provider: Clarice Pole, MD Duration: 05/14/2021 0930 to 05/15/2021 0930  Patient history: 26 year old woman with hx of blindness, polysubstance abuse who was [place] boyfriend [place] and found unresponsive, unknown downtime. Now with myoclonus. EEG to evaluate for seizure  Level of alertness:  comatose  AEDs during EEG study: propofol, versed, LEV  Technical aspects: This EEG study was done with scalp electrodes positioned according to the 10-20 International system of electrode placement. Electrical activity was acquired at a sampling rate of 500Hz  and reviewed with a high frequency filter of 70Hz  and a low frequency filter of 1Hz . EEG data were recorded continuously and digitally stored.  Description: At the beginning of study, EEG showed continuous generalized polymorphic mixed frequencies with predominantly 3 to 7 Hz theta-delta slowing admixed with intermittent generalized 15 to 18 Hz beta activity. Gradually, EEG improved and showed predominantly 8-9hz  generalized alpha activity. Hyperventilation and photic stimulation were not performed.    ABNORMALITY - Continuous slow, generalized  IMPRESSION: This study was initially suggestive of severe diffuse encephalopathy, nonspecific etiology. Gradually, EEG improved and was suggestive of mild to moderate diffuse encephalopathy, likely due to sedation.  No seizures or  epileptiform discharges were seen throughout the recording.  Priyanka 07/14/2021  ? ?ECHOCARDIOGRAM COMPLETE ? ?  Result Date: 05/14/2021 ?   ECHOCARDIOGRAM REPORT   Patient Name:   Beth Barnes Date of Exam: 05/14/2021 Me

## 2021-05-23 NOTE — IPOC Note (Signed)
Overall Plan of Care (IPOC) ?Patient Details ?Name: Beth Barnes ?MRN: [id number] ?DOB: 1995/02/11 ? ?Admitting Diagnosis: Heart failure (HCC) ? ?Hospital Problems: Principal Problem: ?  Heart failure (HCC) ?Active Problems: ?  Acute systolic heart failure (HCC) ? ? ? ? Functional Problem List: ?Nursing Medication Management, Safety, Pain, Endurance, Bowel  ?PT Balance, Endurance, Safety  ?OT Cognition  ?SLP Cognition  ?TR    ?    ? Basic ADL?s: ?OT Dressing, Toileting, Bathing  ? ?  Advanced  ADL?s: ?OT    ?   ?Transfers: ?PT Bed Mobility, Bed to Chair, Car, Furniture, Floor  ?OT Toilet, Tub/Shower  ? ?  Locomotion: ?PT Ambulation, Wheelchair Mobility, Stairs  ? ?  Additional Impairments: ?OT None  ?SLP Social Cognition ?  ?Awareness, Memory  ?TR    ? ? ?Anticipated Outcomes ?Item Anticipated Outcome  ?Self Feeding no goal, pt is independent  ?Swallowing ?   ?  ?Basic self-care ? independent  ?Toileting ? independent ?  ?Bathroom Transfers independent  ?Bowel/Bladder ? manage bowel w mod I assist  ?Transfers ? Mod Indep  ?Locomotion ? Ambulatory modified independent  ?Communication ?    ?Cognition ? mod I  ?Pain ? pain at or below level 4 with prns  ?Safety/Judgment ? maintain safety w cues  ? ?Therapy Plan: ?PT Intensity: Minimum of 1-2 x/day ,45 to 90 minutes ?PT Frequency: 5 out of 7 days ?PT Duration Estimated Length of Stay: 3-5 days ?OT Intensity: Minimum of 1-2 x/day, 45 to 90 minutes ?OT Frequency: 5 out of 7 days ?OT Duration/Estimated Length of Stay: 3-5 days ?SLP Intensity: Minumum of 1-2 x/day, 30 to 90 minutes ?SLP Frequency: 3 to 5 out of 7 days ?SLP Duration/Estimated Length of Stay: 5-7 days  ? ?Due to the current state of emergency, patients may not be receiving their 3-hours of Medicare-mandated therapy. ? ? Team Interventions: ?Nursing Interventions Patient/Family Education, Bowel Management, Pain Management, Medication Management, Disease Management/Prevention, Discharge Planning  ?PT  interventions Ambulation/gait training, Discharge planning, Functional mobility training, Psychosocial support, Therapeutic Activities, Visual/perceptual remediation/compensation, Balance/vestibular training, Disease management/prevention, Neuromuscular re-education, Skin care/wound management, Therapeutic Exercise, Wheelchair propulsion/positioning, Cognitive remediation/compensation, DME/adaptive equipment instruction, Pain management, Splinting/orthotics, UE/LE Strength taining/ROM, Community reintegration, Equities trader education, Museum/gallery curator, UE/LE Coordination activities  ?OT Interventions Cognitive remediation/compensation, Disease mangement/prevention, Discharge planning, Patient/family education, Psychosocial support  ?SLP Interventions Cognitive remediation/compensation, Cueing hierarchy, Medication managment, Functional tasks, Patient/family education, Internal/external aids  ?TR Interventions    ?SW/CM Interventions Discharge Planning, Psychosocial Support, Patient/Family Education  ? ?Barriers to Discharge ?MD  Medical stability  ?Nursing Decreased caregiver support, Home environment access/layout ?1 level 3 ste w mother,  ?PT Decreased caregiver support, Home environment access/layout, Behavior ?   ?OT   ?   ?SLP   ?   ?SW Insurance for SNF coverage, Behavior ?   ? ?Team Discharge Planning: ?Destination: PT-Home ,OT- Home , SLP-Home ?Projected Follow-up: PT-Outpatient PT, OT-  Other (comment) (substance abuse treatment center), SLP-24 hour supervision/assistance ?Projected Equipment Needs: PT-To be determined, OT- None recommended by OT, SLP-None recommended by SLP ?Equipment Details: PT- , OT-  ?Patient/family involved in discharge planning: PT- Patient, Family member/caregiver,  OT-Patient, Family member/caregiver, SLP-Patient ? ?MD ELOS: 3 days ?Medical Rehab Prognosis:  Excellent ?Assessment: The patient has been admitted for CIR therapies with the diagnosis of cardiac debility. The team will  be addressing functional mobility, strength, stamina, balance, safety, adaptive techniques and equipment, self-care, bowel and bladder mgt, patient and caregiver education. Goals have been set  at modI. Anticipated discharge destination is home. ? ?See Team Conference Notes for weekly updates to the plan of care  ?

## 2021-05-26 ENCOUNTER — Encounter (HOSPITAL_COMMUNITY): Payer: Self-pay

## 2021-05-26 ENCOUNTER — Ambulatory Visit (HOSPITAL_COMMUNITY)
Admit: 2021-05-26 | Discharge: 2021-05-26 | Disposition: A | Discharge status: Home / Self Care | Payer: Medicaid Other | Attending: Cardiology | Admitting: Cardiology

## 2021-05-26 ENCOUNTER — Telehealth (HOSPITAL_COMMUNITY): Payer: Self-pay | Admitting: *Deleted

## 2021-05-26 VITALS — BP 130/90 | HR 71 | Wt 139.6 lb

## 2021-05-26 DIAGNOSIS — I469 Cardiac arrest, cause unspecified: Secondary | ICD-10-CM | POA: Diagnosis present

## 2021-05-26 DIAGNOSIS — Z79899 Other long term (current) drug therapy: Secondary | ICD-10-CM | POA: Insufficient documentation

## 2021-05-26 DIAGNOSIS — R569 Unspecified convulsions: Secondary | ICD-10-CM | POA: Diagnosis not present

## 2021-05-26 DIAGNOSIS — I110 Hypertensive heart disease with heart failure: Secondary | ICD-10-CM | POA: Insufficient documentation

## 2021-05-26 DIAGNOSIS — I5022 Chronic systolic (congestive) heart failure: Secondary | ICD-10-CM

## 2021-05-26 DIAGNOSIS — H547 Unspecified visual loss: Secondary | ICD-10-CM | POA: Insufficient documentation

## 2021-05-26 DIAGNOSIS — E876 Hypokalemia: Secondary | ICD-10-CM | POA: Diagnosis not present

## 2021-05-26 DIAGNOSIS — F1010 Alcohol abuse, uncomplicated: Secondary | ICD-10-CM | POA: Insufficient documentation

## 2021-05-26 LAB — BASIC METABOLIC PANEL
Anion gap: 7 (ref 5–15)
BUN: 6 mg/dL (ref 6–20)
CO2: 21 mmol/L — ABNORMAL LOW (ref 22–32)
Calcium: 9.4 mg/dL (ref 8.9–10.3)
Chloride: 112 mmol/L — ABNORMAL HIGH (ref 98–111)
Creatinine, Ser: 0.75 mg/dL (ref 0.44–1.00)
GFR, Estimated: >60 mL/min (ref >60)
Glucose, Bld: 82 mg/dL (ref 70–99)
Potassium: 3.7 mmol/L (ref 3.5–5.1)
Sodium: 140 mmol/L (ref 135–145)

## 2021-05-26 LAB — MAGNESIUM: Magnesium: 2.1 mg/dL (ref 1.7–2.4)

## 2021-05-26 MED ORDER — ENTRESTO 49-51 MG PO TABS: 1.0000 | ORAL_TABLET | Freq: Two times a day (BID) | ORAL | 6 refills | Status: DC

## 2021-05-26 NOTE — Progress Notes (Signed)
? ? ?HEART & VASCULAR TRANSITION OF CARE CONSULT NOTE  ? ? ? ?Referring Physician: Dr. Flora Lipps ?Primary Care: Beth Barnes, No Pcp Per (Inactive) ?Primary Cardiologist: Dr. Antoine Poche  ? ? ?HPI: ?Referred to clinic by Dr. Flora Lipps for heart failure consultation.  ? 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 RNs on the floor attended to her immediately. She was found to be in V-fib.  She did require epinephrine, cardioversion x2, sodium bicarb.  She was treated with Narcan. Achievement of ROSC after being down for ~10 min. She was intubated and admitted to ICU. Had seizures post arrest and treated w/ Keppra. Transferred for [facility] for further management.  ? ?Labs showed hypokalemia and hypomagnesemia and EKG w/ prolonged QT. No acute STE. Electrolyte abnormalties were corrected. Hs trop 280>>1,223>>3,101 but felt likely demand ischemia and not ACS.  ? ?Of note, Boyfriend reported the pt drinks at least 1/5 of a gallon of liquor daily, also reported in another note by CCM hx of cocaine/marijuana as well (TOX + for benzos only, no ETOH level was done). ? ?Initial Echo 4/5 showed severe biventricular failure, LVEF < 20%, RV moderately reduced.  ? ?Repeat limited echo 4/10 showed interval improvement, w/ LVEF up to 30-35%  ? ?cMRI 4/11 showed mild septal hypertrophy 14 mm with mild global hypokinesis EF 42%. There was nonspecific mid myocardial gadolinium uptake on delayed inversion recovery sequences but no evidence of myocarditis or infiltration. Normal RV size and function. Normal cardiac valves.  ? ?Her CM was felt likely ETOH mediated/ stress CM in setting of cardiac arrest, likely triggered by prolonged QT in setting of severe hypokalemia/hypomagnesemia. Evaluated by EP. No indication for ICD.  ? ?Once stabilized, she was placed on GDMT w/ Entresto, Jardiance, Aldactone and Coreg. OCP  recommended. Referral placed to GYN. Has new pt appt scheduled for 5/2. ? ?She presents to clinic today for assessment. Here w/ her mother. Reports doing well symptomatically. No SOB w/ ADLs. NYHA Class I-II. Denies orthopnea/PND. No LEE. Denies CP. No palpitations, syncope/ near syncope. Reports abstinence from ETOH. Fully compliant w/ meds. Tolerating well w/o side effects.  ? ?Of note, prior to current events, she reports h/o hypertension w/ prior baseline SBPs ~160s-170s.  ? ? ?Cardiac Testing  ? ?Initial Echo 05/14/21 ?Left ventricular ejection fraction, by estimation, is <20%. The left ventricle has ?severely decreased function. The left ventricle demonstrates global hypokinesis. Left ?ventricular diastolic parameters are consistent with Grade II diastolic dysfunction ?(pseudonormalization). ?1. ?Right ventricular systolic function is moderately reduced. The right ventricular size is ?normal. ?2. ?3. The mitral valve is normal in structure. No evidence of mitral valve regurgitation. ?The aortic valve is tricuspid. Aortic valve regurgitation is not visualized. No aortic ?stenosis is present. ? ?Repeat Limited Echo 05/19/21 ?Left ventricular ejection fraction, by estimation, is 30 to 35%. The left ventricle has ?moderately decreased function. The left ventricle demonstrates global hypokinesis. ?1. ?2. The mitral valve is normal in structure. ? ?cMRI 05/19/21 ?IMPRESSION: ?1. Mild septal hypertrophy 14 mm with mild global hypokinesis EF 42% ?  ?2. Nonspecific mid myocardial gadolinium uptake on delayed inversion ?recovery sequences ?  ?3. Normal parametric measures no evidence of myocarditis or ?infiltration or edema ?  ?4.  Normal RV size and function ?  ?5.  Normal cardiac valves ?  ?6.  Small but nearly circumferential pericardial effusion ? ?Review of Systems: [  y] = yes, [ ]  = no  ? ?General: Weight gain [ ] ; Weight loss [ ] ; Anorexia [ ] ; Fatigue [ ] ; Fever [ ] ; Chills [ ] ; Weakness [ ]   ?Cardiac: Chest  pain/pressure [ ] ; Resting SOB [ ] ; Exertional SOB [ ] ; Orthopnea [ ] ; Pedal Edema [ ] ; Palpitations [ ] ; Syncope [ ] ; Presyncope [ ] ; Paroxysmal nocturnal dyspnea[ ]   ?Pulmonary: Cough [ ] ; Wheezing[ ] ; Hemoptysis[ ] ; Sputum [ ] ; Snoring [ ]   ?GI: Vomiting[ ] ; Dysphagia[ ] ; Melena[ ] ; Hematochezia [ ] ; Heartburn[ ] ; Abdominal pain [ ] ; Constipation [ ] ; Diarrhea [ ] ; BRBPR [ ]   ?GU: Hematuria[ ] ; Dysuria [ ] ; Nocturia[ ]   ?Vascular: Pain in legs with walking [ ] ; Pain in feet with lying flat [ ] ; Non-healing sores [ ] ; Stroke [ ] ; TIA [ ] ; Slurred speech [ ] ;  ?Neuro: Headaches[ ] ; Vertigo[ ] ; Seizures[ ] ; Paresthesias[ ] ;Blurred vision [ ] ; Diplopia [ ] ; Vision changes [ ]   ?Ortho/Skin: Arthritis [ ] ; Joint pain [ ] ; Muscle pain [ ] ; Joint swelling [ ] ; Back Pain [ ] ; Rash [ ]   ?Psych: Depression[ ] ; Anxiety[ ]   ?Heme: Bleeding problems [ ] ; Clotting disorders [ ] ; Anemia [ ]   ?Endocrine: Diabetes [ ] ; Thyroid dysfunction[ ]  ? ? ?Past Medical History:  ?Diagnosis Date  ? Blind   ? Congenital glaucoma of both eyes   ? Hypokalemia   ? ? ?Current Outpatient Medications  ?Medication Sig Dispense Refill  ? acetaminophen (TYLENOL) 325 MG tablet Take 1-2 tablets (325-650 mg total) by mouth every 4 (four) hours as needed for mild pain.    ? carvedilol (COREG) 12.5 MG tablet Take 12.5 mg by mouth 2 (two) times daily with a meal.    ? dapagliflozin propanediol (FARXIGA) 10 MG TABS tablet Take 1 tablet (10 mg total) by mouth daily. 30 tablet 0  ? dorzolamide-timolol (COSOPT) 22.3-6.8 MG/ML ophthalmic solution Place 1 drop into the left eye 2 (two) times daily.    ? folic acid (FOLVITE) 1 MG tablet Take 1 tablet (1 mg total) by mouth daily. 30 tablet 0  ? levETIRAcetam (KEPPRA) 750 MG tablet Take 750 mg by mouth 2 (two) times daily.    ? potassium chloride SA (KLOR-CON M) 20 MEQ tablet Take 1 tablet (20 mEq total) by mouth daily. 30 tablet 0  ? sacubitril-valsartan (ENTRESTO) 49-51 MG Take 1 tablet by mouth 2 (two) times  daily. 60 tablet 6  ? spironolactone (ALDACTONE) 25 MG tablet Take 1 tablet (25 mg total) by mouth daily. 30 tablet 0  ? traZODone (DESYREL) 50 MG tablet Take 1 tablet (50 mg total) by mouth at bedtime as needed for sleep. 30 tablet 0  ? triamcinolone cream (KENALOG) 0.1 % Apply 2 x daily to the affected area. (Beth Barnes not taking: Reported on 05/26/2021) 30 g 0  ? ?No current facility-administered medications for this encounter.  ? ? ?Allergies  ?Allergen Reactions  ? Cetirizine & Related Hives  ? ? ?  ?Social History  ? ?Socioeconomic History  ? Marital status: Significant Other  ?  Spouse name: Not on file  ? Number of children: 0  ? Years of education: Not on file  ? Highest education level: High school graduate  ?Occupational History  ?  Comment: NO  ?Tobacco Use  ? Smoking status: Some Days  ?  Packs/day: 0.50  ?  Years: 5.00  ?  Pack years: 2.50  ?  Types: Cigarettes  ? Smokeless  tobacco: Never  ? Tobacco comments:  ?  Smoking cessation  ?Vaping Use  ? Vaping Use: Some days  ? Substances: Nicotine, THC, Flavoring, Mixture of cannabinoids  ?Substance and Sexual Activity  ? Alcohol use: Yes  ?  Alcohol/week: 3.0 standard drinks  ?  Types: 3 Shots of liquor per week  ?  Comment: every day  ? Drug use: Not Currently  ?  Types: Marijuana  ?  Comment: January  ? Sexual activity: Not on file  ?Other Topics Concern  ? Not on file  ?Social History Narrative  ? Not on file  ? ?Social Determinants of Health  ? ?Financial Resource Strain: Low Risk   ? Difficulty of Paying Living Expenses: Not very hard  ?Food Insecurity: No Food Insecurity  ? Worried About Programme researcher, broadcasting/film/video in the Last Year: Never true  ? Ran Out of Food in the Last Year: Never true  ?Transportation Needs: No Transportation Needs  ? Lack of Transportation (Medical): No  ? Lack of Transportation (Non-Medical): No  ?Physical Activity: Not on file  ?Stress: Not on file  ?Social Connections: Not on file  ?Intimate Partner Violence: Not on file  ? ? ?   ?Family History  ?Problem Relation Age of Onset  ? Healthy Mother   ? Healthy Father   ? ? ?Vitals:  ? 05/26/21 1418  ?BP: 130/90  ?Pulse: 71  ?SpO2: 96%  ?Weight: 63.3 kg (139 lb 9.6 oz)  ? ? ?PHYSICAL EXAM: ?General:  We

## 2021-05-26 NOTE — Patient Instructions (Signed)
Thank you for coming in today ? ?Labs were done today, if any labs are abnormal the clinic will call you ?No news is good news ? ?INCREASE Entresto to 49/51 mg 1 tablet twice daily  ? ?Your physician recommends that you schedule a follow-up appointment in:  ?3 weeks  ? ?At the [facility], you and your health needs are our priority. As part of our continuing mission to provide you with exceptional heart care, we have created designated Provider Care Teams. These Care Teams include your primary Cardiologist (physician) and Advanced Practice Providers (APPs- Physician Assistants and Nurse Practitioners) who all work together to provide you with the care you need, when you need it.  ? ?You may see any of the following providers on your designated Care Team at your next follow up: ?Dr Glori Bickers ?Dr Loralie Champagne ?Darrick Grinder, NP ?Lyda Jester, PA ?Jessica Milford,NP ?Marlyce Huge, PA ?Audry Lore, PharmD ? ? ?Please be sure to bring in all your medications bottles to every appointment.  ? ?If you have any questions or concerns before your next appointment please send [place] a message through [employer] or call our office at [phone].   ? ?TO LEAVE A MESSAGE FOR THE NURSE SELECT OPTION 2, PLEASE LEAVE A MESSAGE INCLUDING: ?YOUR NAME ?DATE OF BIRTH ?CALL BACK NUMBER ?REASON FOR CALL**this is important as we prioritize the call backs ? ?YOU WILL RECEIVE A CALL BACK THE SAME DAY AS LONG AS YOU CALL BEFORE 4:00 PM ? ?

## 2021-05-26 NOTE — Telephone Encounter (Signed)
Called to confirm Heart & Vascular Transitions of Care appointment at 2pm on 05/26/21. Patient reminded to bring all medications and pill box organizer with them. Confirmed patient has transportation. Gave directions, instructed to utilize valet parking. ? ?Confirmed appointment prior to ending call.   ?Mom will be driving her and being here for the appointment.  ? ?Rhae Hammock, BSN, RN ?Heart Failure Nurse Navigator ?Secure Chat Only  ?

## 2021-05-27 NOTE — Plan of Care (Signed)
Long-term goals not met due LOS; pt evaluated on 4/12 and d/c'ed on 4/14 with frequency for 1 to 3x per week. Recommend follow-up in OP setting.  ? ?Problem: RH Memory ?Goal: LTG Patient will demonstrate ability for day to day (SLP) ?Description: LTG:   Patient will demonstrate ability for day to day recall/carryover during cognitive/linguistic activities with assist  (SLP) ?Outcome: Not Met (add Reason) ?Goal: LTG Patient will use memory compensatory aids to (SLP) ?Description: LTG:  Patient will use memory compensatory aids to recall biographical/new, daily complex information with cues (SLP) ?Outcome: Not Met (add Reason) ?  ?Problem: RH Awareness ?Goal: LTG: Patient will demonstrate awareness during functional activites type of (SLP) ?Description: LTG: Patient will demonstrate awareness during functional activites type of (SLP) ?Outcome: Not Met (add Reason) ?  ?

## 2021-05-27 NOTE — Progress Notes (Signed)
Speech Language Pathology Discharge Summary ? ?Patient Details  ?Name: Beth Barnes ?MRN: [id number] ?Date of Birth: 1995/10/09 ?  ? ?Patient has met 0 of 3 long term goals.  Patient to discharge at overall   level.  ?Reasons goals not met: Pt discharged prior to initiation of ST treatment  ? ?Clinical Impression/Discharge Summary: Pt was evaluated on 4/12 with the following findings: ?"Pt presents with mild cognitive impairments in memory and higher level awareness. Pt expressed acute changes in intermittent confusion and need for substance abuse treatment (therapy and AAA.) Pt scored WFL on SLUMS adjusted due to vision impairments 22/24. Pt demonstrated WFL on Cognistat similarities and judgement subsections. Functional problem solving was not assessed at a high level due to vision impairments, but pt was able to problem solving and navigate around the room to use the bathroom/wash her hands mod I. Pt demonstrated difficulty recalling novel medications and was able to manage medications, money and time at baseline. Pt required several cues to not ambulate around the room without assistance, demonstrating reduced safety awareness. Pt would benefit from skilled ST services in order to maximize functional independence and reduce burden of care, likely requiring supervision at discharge." ? ?Pt unable to be seen by ST for treatment prior to discharge; therefore, did not meet goals. Recommend f/u with OP ST services and assistance with all iADL tasks. ? ?Care Partner:  ?Caregiver Able to Provide Assistance: Yes  ?Type of Caregiver Assistance: Cognitive ? ?Recommendation:  ?24 hour supervision/assistance;Outpatient SLP  ?Rationale for SLP Follow Up: Maximize cognitive function and independence;Reduce caregiver burden  ? ?Equipment: N/A  ? ?Reasons for discharge: Discharged from hospital  ? ?Patient/Family Agrees with Progress Made and Goals Achieved: Yes  ? ? ?Beth Barnes ?05/27/2021, 1:37 PM ? ?

## 2021-05-28 ENCOUNTER — Ambulatory Visit: Payer: Medicaid Other | Admitting: Neurology

## 2021-05-28 ENCOUNTER — Encounter: Payer: Self-pay | Admitting: Neurology

## 2021-05-28 VITALS — BP 114/64 | HR 70 | Ht 66.0 in | Wt 139.5 lb

## 2021-05-28 DIAGNOSIS — G253 Myoclonus: Secondary | ICD-10-CM | POA: Diagnosis not present

## 2021-05-28 DIAGNOSIS — R569 Unspecified convulsions: Secondary | ICD-10-CM | POA: Diagnosis not present

## 2021-05-28 DIAGNOSIS — G40909 Epilepsy, unspecified, not intractable, without status epilepticus: Secondary | ICD-10-CM | POA: Diagnosis not present

## 2021-05-28 DIAGNOSIS — I509 Heart failure, unspecified: Secondary | ICD-10-CM

## 2021-05-28 DIAGNOSIS — H5410 Blindness, one eye, low vision other eye, unspecified eyes: Secondary | ICD-10-CM | POA: Diagnosis not present

## 2021-05-28 DIAGNOSIS — I469 Cardiac arrest, cause unspecified: Secondary | ICD-10-CM

## 2021-05-28 MED ORDER — LEVETIRACETAM 750 MG PO TABS
750.0000 mg | ORAL_TABLET | Freq: Two times a day (BID) | ORAL | 6 refills | Status: DC
  Filled 2021-06-27: qty 60, 30d supply, fill #0
  Filled 2021-07-24: qty 60, 30d supply, fill #1
  Filled 2021-09-19: qty 60, 30d supply, fill #2
  Filled 2021-10-24: qty 60, 30d supply, fill #3

## 2021-05-28 NOTE — Progress Notes (Signed)
? ?[facility] ? ?PATIENT: Jacquelina Kann ?DOB: 1995-06-13 ? ?REQUESTING CLINICIAN: Rolly Salter, MD ?HISTORY FROM: Patient and mother  ?REASON FOR VISIT: Post cardiac myoclonic jerks ? ? ?HISTORICAL ? ?CHIEF COMPLAINT:  ?Chief Complaint  ?Patient presents with  ? New Patient (Initial Visit)  ?  Rm 13. Accompanied by mom. ?NP internal referral for Seizure. ?Discuss Keppra. Currently taking 750 mg and 750 mg at night.  ? ? ?HISTORY OF PRESENT ILLNESS:  ?This is a 26 year old woman with past medical history of congenital glaucoma and blindness, alcohol abuse and polysubstance abuse who is presenting after cardiac arrest in the hospital.  In brief patient was visiting her her boyfriend who was admitted in the hospital and was found unresponsive, unknown downtime.  CPR began and patient intubated and transferred in the ICU.  She was found to be in V-fib for initial rhythm.  Cardiac arrest downtown about 10 minutes.  In the ICU she was noted to have myoclonic jerks.  Propofol and Versed started.  She did have a EEG which did not show any acute seizure but show moderate to mild diffuse encephalopathy.  She was started on Keppra.  Her mental status continues to improve, EEG still no seizures and disconnected.  Since discharge from the hospital, patient reported she has been doing well, she is forgetful and also complaint of daytime tiredness.  She is currently on Keppra 750 mg twice daily.  Denies any previous history of seizures other than febrile seizure at the age of 4 and no seizures since leaving the hospital.   ? ? ?Hospital Course: ?26 year old woman with hx of glaucoma and blindness, polysubstance abuse who was visiting boyfriend in [place] and found unresponsive, unknown downtime.  CPR begun immediately (see separate note).  CRNA intubated.  Initial rhythm vfib. ROSC obtained and patient direct admitted to ICU.  ?4/4 cardiac arrest ~10 min downtime ?4/5 Transferred to [facility] for LTM EEG, intubated on  Levophed ?4/5 EEG>This study is suggestive of severe diffuse encephalopathy, nonspecific etiology.  No seizures or epileptiform discharges were seen throughout the recording.Echo 4/5>1. Left ventricular ejection fraction, by estimation, is <20%.  ?4/4 CTH>no acute findings ?  ?Assessment and Plan: ?V-fib arrest. ?New onset acute systolic CHF. 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 ?Cardiology consulted. ?Cardiology will consult EP. ?Currently medical therapy recommended. ?Echocardiogram shows EF of 20%.  Global hypokinesis. ?Coreg, Aldactone, Entresto and Jardiance. ?  ?Postarrest encephalopathy. ?Persistent myoclonus  ?Mentation improving. ?Neurology was consulted. ?Patient had myoclonic jerk after encephalopathy. ?On 1500 mg of Keppra twice daily. ?Per neuro "MRI brain now less likely to reveal an abnormality given dramatic improvements in her neurological exam over the past 3 days, as well as normalization of her EEG on Thursday." ? ? ? ?OTHER MEDICAL CONDITIONS: Glaucoma, legally blind, heart failure  ? ?REVIEW OF SYSTEMS: Full 14 system review of systems performed and negative with exception of: as noted in the HPI  ? ?ALLERGIES: ?Allergies  ?Allergen Reactions  ? Cetirizine & Related Hives  ? ? ?HOME MEDICATIONS: ?Outpatient Medications Prior to Visit  ?Medication Sig Dispense Refill  ? acetaminophen (TYLENOL) 325 MG tablet Take 1-2 tablets (325-650 mg total) by mouth every 4 (four) hours as needed for mild pain.    ? busPIRone HCl (BUSPAR PO) Take by mouth.    ? carvedilol (COREG) 12.5 MG tablet Take 12.5 mg by mouth 2 (two) times daily with a meal.    ? dapagliflozin propanediol (FARXIGA) 10 MG TABS tablet Take 1  tablet (10 mg total) by mouth daily. 30 tablet 0  ? dorzolamide-timolol (COSOPT) 22.3-6.8 MG/ML ophthalmic solution Place 1 drop into the left eye 2 (two) times daily.    ? folic acid (FOLVITE) 1 MG tablet Take 1 tablet (1 mg total) by mouth daily. 30 tablet 0  ? potassium chloride SA (KLOR-CON M)  20 MEQ tablet Take 1 tablet (20 mEq total) by mouth daily. 30 tablet 0  ? sacubitril-valsartan (ENTRESTO) 49-51 MG Take 1 tablet by mouth 2 (two) times daily. 60 tablet 6  ? spironolactone (ALDACTONE) 25 MG tablet Take 1 tablet (25 mg total) by mouth daily. 30 tablet 0  ? traZODone (DESYREL) 50 MG tablet Take 1 tablet (50 mg total) by mouth at bedtime as needed for sleep. 30 tablet 0  ? triamcinolone cream (KENALOG) 0.1 % Apply 2 x daily to the affected area. 30 g 0  ? levETIRAcetam (KEPPRA) 750 MG tablet Take 750 mg by mouth 2 (two) times daily.    ? ?No facility-administered medications prior to visit.  ? ? ?PAST MEDICAL HISTORY: ?Past Medical History:  ?Diagnosis Date  ? Blind   ? Congenital glaucoma of both eyes   ? Hypokalemia   ? ? ?PAST SURGICAL HISTORY: ?Past Surgical History:  ?Procedure Laterality Date  ? EYE SURGERY    ? ? ?FAMILY HISTORY: ?Family History  ?Problem Relation Age of Onset  ? Healthy Mother   ? Healthy Father   ? ? ?SOCIAL HISTORY: ?Social History  ? ?Socioeconomic History  ? Marital status: Significant Other  ?  Spouse name: Not on file  ? Number of children: 0  ? Years of education: Not on file  ? Highest education level: High school graduate  ?Occupational History  ?  Comment: NO  ?Tobacco Use  ? Smoking status: Some Days  ?  Packs/day: 0.50  ?  Years: 5.00  ?  Pack years: 2.50  ?  Types: Cigarettes  ? Smokeless tobacco: Never  ? Tobacco comments:  ?  Smoking cessation  ?Vaping Use  ? Vaping Use: Some days  ? Substances: Nicotine, THC, Flavoring, Mixture of cannabinoids  ?Substance and Sexual Activity  ? Alcohol use: Yes  ?  Alcohol/week: 3.0 standard drinks  ?  Types: 3 Shots of liquor per week  ?  Comment: every day  ? Drug use: Not Currently  ?  Types: Marijuana  ?  Comment: January  ? Sexual activity: Not on file  ?Other Topics Concern  ? Not on file  ?Social History Narrative  ? Not on file  ? ?Social Determinants of Health  ? ?Financial Resource Strain: Low Risk   ? Difficulty of  Paying Living Expenses: Not very hard  ?Food Insecurity: No Food Insecurity  ? Worried About Programme researcher, broadcasting/film/video in the Last Year: Never true  ? Ran Out of Food in the Last Year: Never true  ?Transportation Needs: No Transportation Needs  ? Lack of Transportation (Medical): No  ? Lack of Transportation (Non-Medical): No  ?Physical Activity: Not on file  ?Stress: Not on file  ?Social Connections: Not on file  ?Intimate Partner Violence: Not on file  ? ? ?PHYSICAL EXAM ? ? ?GENERAL EXAM/CONSTITUTIONAL: ?Vitals:  ?Vitals:  ? 05/28/21 1336  ?BP: 114/64  ?Pulse: 70  ?Weight: 139 lb 8 oz (63.3 kg)  ?Height: 5\' 6"  (1.676 m)  ? ?Body mass index is 22.52 kg/m?. ?Wt Readings from Last 3 Encounters:  ?05/28/21 139 lb 8 oz (63.3 kg)  ?  05/26/21 139 lb 9.6 oz (63.3 kg)  ?05/23/21 140 lb 3.4 oz (63.6 kg)  ? ?Patient is in no distress; well developed, nourished and groomed; neck is supple ? ?CARDIOVASCULAR: ?Examination of carotid arteries is normal; no carotid bruits ?Regular rate and rhythm, no murmurs ?Examination of peripheral vascular system by observation and palpation is normal ? ?EYES: ?Pupils round and reactive to light, Visual fields full to confrontation, Extraocular movements intacts,  ?No results found. ? ?MUSCULOSKELETAL: ?Gait, strength, tone, movements noted in Neurologic exam below ? ?NEUROLOGIC: ?MENTAL STATUS:  ?   ? View : No data to display.  ?  ?  ?  ? ?awake, alert, oriented to person, place and time ?recent and remote memory intact ?normal attention and concentration ?language fluent, comprehension intact, naming intact ?fund of knowledge appropriate ? ?CRANIAL NERVE:  ?2nd, 3rd, 4th, 6th - pupils equal and reactive to light, visual fields full to confrontation, extraocular muscles intact, no nystagmus ?5th - facial sensation symmetric ?7th - facial strength symmetric ?8th - hearing intact ?9th - palate elevates symmetrically, uvula midline ?11th - shoulder shrug symmetric ?12th - tongue protrusion  midline ? ?MOTOR:  ?normal bulk and tone, full strength in the BUE, BLE ? ?SENSORY:  ?normal and symmetric to light touch, pinprick, temperature, vibration ? ?COORDINATION:  ?finger-nose-finger, fine finger movem

## 2021-05-28 NOTE — Patient Instructions (Signed)
Continue with Keppra 750 mg twice daily  ?Continue with your other medications  ?Follow up with your other doctors as scheduled  ?Return in 6 months  ?

## 2021-06-03 ENCOUNTER — Encounter: Payer: Self-pay | Admitting: Physical Medicine and Rehabilitation

## 2021-06-03 ENCOUNTER — Encounter
Payer: Medicaid Other | Attending: Physical Medicine and Rehabilitation | Admitting: Physical Medicine and Rehabilitation

## 2021-06-03 VITALS — BP 113/73 | HR 63 | Ht 66.0 in | Wt 143.4 lb

## 2021-06-03 DIAGNOSIS — I509 Heart failure, unspecified: Secondary | ICD-10-CM | POA: Insufficient documentation

## 2021-06-03 DIAGNOSIS — I426 Alcoholic cardiomyopathy: Secondary | ICD-10-CM | POA: Diagnosis present

## 2021-06-03 DIAGNOSIS — G4700 Insomnia, unspecified: Secondary | ICD-10-CM | POA: Diagnosis present

## 2021-06-03 MED ORDER — CARVEDILOL 12.5 MG PO TABS: 12.5000 mg | ORAL_TABLET | Freq: Every evening | ORAL | 3 refills | Status: DC

## 2021-06-03 MED ORDER — TRAZODONE HCL 50 MG PO TABS: 100.0000 mg | ORAL_TABLET | Freq: Every evening | ORAL | 3 refills | Status: DC | PRN

## 2021-06-03 MED ORDER — ACETAMINOPHEN 325 MG PO TABS: 325.0000 mg | ORAL_TABLET | ORAL | Status: DC | PRN

## 2021-06-03 MED ORDER — FOLIC ACID 1 MG PO TABS: 1.0000 mg | ORAL_TABLET | Freq: Every day | ORAL | 3 refills | Status: AC

## 2021-06-03 MED ORDER — ENTRESTO 49-51 MG PO TABS: 1.0000 | ORAL_TABLET | Freq: Two times a day (BID) | ORAL | 3 refills | Status: DC

## 2021-06-03 MED ORDER — TRIAMCINOLONE ACETONIDE 0.1 % EX CREA: TOPICAL_CREAM | CUTANEOUS | 3 refills | Status: DC

## 2021-06-03 MED ORDER — SPIRONOLACTONE 25 MG PO TABS
25.0000 mg | ORAL_TABLET | Freq: Every day | ORAL | 3 refills | Status: DC
Start: 2021-06-03 — End: 2021-06-27

## 2021-06-03 NOTE — Progress Notes (Signed)
? ?Subjective:  ? ? Patient ID: Beth Barnes, female    DOB: [birth year], 26 y.o.   MRN: [id number] ? ?HPI ?Beth Barnes is a 26 year old woman who presents for hospital follow-up after CIR admission for cardiomyopathy and heart failure ? ?-her mother accompanies her today ?-she does need medication refills ?-she has been ambulating well without falls ?-denies pain ?-vitals stable ?-sleeps poorly at night despite taking the 70m of trazodone- she falls asleep in the morning ? ?Pain Inventory ?Average Pain 0 ?Pain Right Now 0 ?My pain is  n/a ? ?In the last 24 hours, has pain interfered with the following? ?General activity 0 ?Relation with others 0 ?Enjoyment of life 0 ?What TIME of day is your pain at its worst? varies ?Sleep (in general) Poor ? ?Pain is worse with:  n/a ?Pain improves with:  n/a ?Relief from Meds:  n/a ? ?use a cane ?how many minutes can you walk? 30 ?ability to climb steps?  yes ?do you drive?  no ? ?disabled: date disabled 10-12-96 ?Do you have any goals in this area?  no ? ?confusion ?depression ?anxiety ? ?Any changes since last visit?  no ? ?Any changes since last visit?  no ? ? ? ?Family History  ?Problem Relation Age of Onset  ? Healthy Mother   ? Healthy Father   ? ?Social History  ? ?Socioeconomic History  ? Marital status: Significant Other  ?  Spouse name: Not on file  ? Number of children: 0  ? Years of education: Not on file  ? Highest education level: High school graduate  ?Occupational History  ?  Comment: NO  ?Tobacco Use  ? Smoking status: Some Days  ?  Packs/day: 0.50  ?  Years: 5.00  ?  Pack years: 2.50  ?  Types: Cigarettes  ? Smokeless tobacco: Never  ? Tobacco comments:  ?  Smoking cessation  ?Vaping Use  ? Vaping Use: Some days  ? Substances: Nicotine, THC, Flavoring, Mixture of cannabinoids  ?Substance and Sexual Activity  ? Alcohol use: Yes  ?  Alcohol/week: 3.0 standard drinks  ?  Types: 3 Shots of liquor per week  ?  Comment: every day  ? Drug use: Not Currently  ?  Types:  Marijuana  ?  Comment: January  ? Sexual activity: Not on file  ?Other Topics Concern  ? Not on file  ?Social History Narrative  ? Not on file  ? ?Social Determinants of Health  ? ?Financial Resource Strain: Low Risk   ? Difficulty of Paying Living Expenses: Not very hard  ?Food Insecurity: No Food Insecurity  ? Worried About Programme researcher, broadcasting/film/video in the Last Year: Never true  ? Ran Out of Food in the Last Year: Never true  ?Transportation Needs: No Transportation Needs  ? Lack of Transportation (Medical): No  ? Lack of Transportation (Non-Medical): No  ?Physical Activity: Not on file  ?Stress: Not on file  ?Social Connections: Not on file  ? ?Past Surgical History:  ?Procedure Laterality Date  ? EYE SURGERY    ? ?Past Medical History:  ?Diagnosis Date  ? Blind   ? Congenital glaucoma of both eyes   ? Hypokalemia   ? ?BP 113/73   Pulse 63   Ht 5\' 6"  (1.676 m)   Wt 143 lb 6.4 oz (65 kg)   SpO2 100%   BMI 23.15 kg/m?  ? ?Opioid Risk Score:   ?Fall Risk Score:  `1 ? ?Depression screen PHQ  2/9 ? ? ?  06/03/2021  ?  3:10 PM  ?Depression screen PHQ 2/9  ?Decreased Interest 1  ?Down, Depressed, Hopeless 1  ?PHQ - 2 Score 2  ?Altered sleeping 3  ?Tired, decreased energy 2  ?Change in appetite 0  ?Feeling bad or failure about yourself  0  ?Trouble concentrating 0  ?Moving slowly or fidgety/restless 0  ?Suicidal thoughts 0  ?PHQ-9 Score 7  ?  ? ?Review of Systems  ?Constitutional:  Positive for unexpected weight change.  ?HENT: Negative.    ?Eyes: Negative.   ?Respiratory: Negative.    ?Cardiovascular: Negative.   ?Gastrointestinal: Negative.   ?Endocrine: Negative.   ?Genitourinary: Negative.   ?Musculoskeletal: Negative.   ?Skin: Negative.   ?Allergic/Immunologic: Negative.   ?Neurological: Negative.   ?Hematological: Negative.   ?Psychiatric/Behavioral:  Positive for confusion and dysphoric mood. The patient is nervous/anxious.   ? ?   ?Objective:  ? Physical Exam ?Gen: no distress, normal appearing ?HEENT: oral mucosa  pink and moist, NCAT, congenital blindness ?Cardio: Reg rate ?Chest: normal effort, normal rate of breathing ?Abd: soft, non-distended ?Ext: no edema ?Psych: pleasant, normal affect ?Skin: intact ?Neuro/Musculoskeletal: Strength intact ?   ?Assessment & Plan:  ?HTN: ?-BP is 113/73 today.  ?-Advised checking BP daily at home and logging results to bring into follow-up appointment with PCP and myself. ?-Reviewed BP meds today.  ?-Advised regarding healthy foods that can help lower blood pressure and provided with a list: ?1) citrus foods- high in vitamins and minerals ?2) salmon and other fatty fish - reduces inflammation and oxylipins ?3) swiss chard (leafy green)- high level of nitrates ?4) pumpkin seeds- one of the best natural sources of magnesium ?5) Beans and lentils- high in fiber, magnesium, and potassium ?6) Berries- high in flavonoids ?7) Amaranth (whole grain, can be cooked similarly to rice and oats)- high in magnesium and fiber ?8) Pistachios- even more effective at reducing BP than other nuts ?9) Carrots- high in phenolic compounds that relax blood vessels and reduce inflammation ?10) Celery- contain phthalides that relax tissues of arterial walls ?11) Tomatoes- can also improve cholesterol and reduce risk of heart disease ?12) Broccoli- good source of magnesium, calcium, and potassium ?13) Greek yogurt: high in potassium and calcium ?14) Herbs and spices: Celery seed, cilantro, saffron, lemongrass, black cumin, ginseng, cinnamon, cardamom, sweet basil, and ginger ?15) Chia and flax seeds- also help to lower cholesterol and blood sugar ?16) Beets- high levels of nitrates that relax blood vessels  ?17) spinach and bananas- high in potassium ? ?-Provided lise of supplements that can help with hypertension:  ?1) magnesium: one high quality brand is Bioptemizers since it contains all 7 types of magnesium, otherwise over the counter magnesium gluconate 400mg  is a good option ?2) B vitamins ?3) vitamin D ?4)  potassium ?5) CoQ10 ?6) L-arginine ?7) Vitamin C ?8) Beetroot ?-Educated that goal BP is 120/80. ?-Made goal to incorporate some of the above foods into diet.   ? ?2) Heart failure and cardiomyopathy ?-refilled spironolactone ?-refilled coreg ?-refilled Entresto ? ?3) Insomnia ?-increase Trazodone to 100mg  HS ? ?4) History of alcohol use ?-refilled folic acid supplement ? ?Current impairments: ? ?Patient will require home OT and PT; orders have been placed ? ?The patient's medical and/or psychosocial problems require moderate decision-making during transitions in care from inpatient rehabilitation to home. This transitional care appointment included review of the patient's hospital discharge summary, review of the patient's hospital diagnostic tests and discussion of appropriate follow-up, education of  the patient regarding their condition, re-establishment of necessary referrals. I will be reviewing patient's home and/or outpatient therapy notes as they progress through therapy and corresponding with therapists accordingly. I have encouraged compliance with current medication regimen (with adjustment to regimen as needed), follow-up with necessary providers, and the importance of following a healthy diet and exercise routine to maximize recovery, health, and quality of life.   ?

## 2021-06-10 ENCOUNTER — Encounter: Payer: Medicaid Other | Admitting: Obstetrics and Gynecology

## 2021-06-16 ENCOUNTER — Ambulatory Visit (HOSPITAL_COMMUNITY): Payer: Medicaid Other

## 2021-06-23 ENCOUNTER — Ambulatory Visit (HOSPITAL_COMMUNITY): Payer: Medicaid Other

## 2021-06-26 NOTE — Progress Notes (Addendum)
HEART & VASCULAR TRANSITION OF CARE CLINIC NOTE     Referring Physician: Dr. Audie Box Primary Care: Patient, No Pcp Per (Inactive) Primary Cardiologist: N/a   HPI:  26 y/o female who is blind w/ h/o HTN, w/ reported h/o significant ETOH abuse, who recently suffered witness cardiac arrest while visiting her boyfriend who was hospitalized at [facility]. RNs on the floor attended to her immediately. She was found to be in V-fib.  She did require epinephrine, cardioversion x2, sodium bicarb.  She was treated with Narcan. Achievement of ROSC after being down for ~10 min. She was intubated and admitted to ICU. Had seizures post arrest and treated w/ Keppra. Transferred for [facility] for further management.   Labs showed hypokalemia and hypomagnesemia and EKG w/ prolonged QT. No acute STE. Electrolyte abnormalties were corrected. Hs trop 280>>1,223>>3,101 but felt likely demand ischemia and not ACS.   Of note, Boyfriend reported the pt drinks at least 1/5 of a gallon of liquor daily, also reported in another note by CCM hx of cocaine/marijuana as well (TOX + for benzos only, no ETOH level was done).  Initial Echo 4/5 showed severe biventricular failure, LVEF < 20%, RV moderately reduced.   Repeat limited echo 4/10 showed interval improvement, w/ LVEF up to 30-35%   cMRI 4/11 showed mild septal hypertrophy 14 mm with mild global hypokinesis EF 42%. There was nonspecific mid myocardial gadolinium uptake on delayed inversion recovery sequences but no evidence of myocarditis or infiltration. Normal RV size and function. Normal cardiac valves.   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 Evaluated by EP. No indication for ICD.   Once stabilized, she was placed on GDMT w/ Entresto, Jardiance, Aldactone and Coreg. OCP recommended. Referral placed to GYN- no showed appointment 05/02.   Here today  for medication titration and f/u. All of her pill bottles are empty with the exception of coreg, which is full. She does not know which medications she is taking. Folic acid, trazodone, and buspar are on her list but she does not have the bottles. She is legally blind and is unable to read pill bottles. The patient is frustrated with difficulty managing her medicines. Tearful at today's visit.  Denies dyspnea, orthopnea, PND or LE edema. Main compliant is fatigue.  Unfortunately has been drinking alcohol most days. Reports struggling with alcoholism. Endorses depression.   Lives in a single-family home with her boyfriend who is also legally blind.   Cardiac Testing   Initial Echo 05/14/21 Left ventricular ejection fraction, by estimation, is <20%. The left ventricle has severely decreased function. The left ventricle demonstrates global hypokinesis. Left ventricular diastolic parameters are consistent with Grade II diastolic dysfunction (pseudonormalization). 1. Right ventricular systolic function is moderately reduced. The right ventricular size is normal. 2. 3. The mitral valve is normal in structure. No evidence of mitral valve regurgitation. The aortic valve is tricuspid. Aortic valve regurgitation is not visualized. No aortic stenosis is present.  Repeat Limited Echo 05/19/21 Left ventricular ejection fraction, by estimation, is 30 to 35%. The left ventricle has moderately decreased function. The left ventricle demonstrates global hypokinesis. 1. 2. The mitral valve is normal in structure.  cMRI 05/19/21 IMPRESSION: 1. Mild septal hypertrophy 14 mm with mild global hypokinesis EF 42%   2. Nonspecific mid myocardial gadolinium uptake on delayed inversion recovery sequences   3. Normal parametric measures no evidence of myocarditis or  infiltration or edema   4.  Normal RV size and function   5.  Normal cardiac valves   6.  Small but nearly circumferential pericardial  effusion  Review of Systems: Cardiac and respiratory - Negative except as mentioned in HPI   Past Medical History:  Diagnosis Date   Blind    Congenital glaucoma of both eyes    Hypokalemia     Current Outpatient Medications  Medication Sig Dispense Refill   acetaminophen (TYLENOL) 325 MG tablet Take 1-2 tablets (325-650 mg total) by mouth every 4 (four) hours as needed for mild pain.     dorzolamide-timolol (COSOPT) 22.3-6.8 MG/ML ophthalmic solution Place 1 drop into the left eye 2 (two) times daily.     folic acid (FOLVITE) 1 MG tablet Take 1 tablet (1 mg total) by mouth daily. 90 tablet 3   levETIRAcetam (KEPPRA) 750 MG tablet Take 1 tablet (750 mg total) by mouth 2 (two) times daily. 60 tablet 6   potassium chloride SA (KLOR-CON M) 20 MEQ tablet Take 1 tablet (20 mEq total) by mouth 2 (two) times daily. 180 tablet 3   traZODone (DESYREL) 50 MG tablet Take 2 tablets (100 mg total) by mouth at bedtime as needed for sleep. 180 tablet 3   triamcinolone cream (KENALOG) 0.1 % Apply 2 x daily to the affected area. 30 g 3   busPIRone HCl (BUSPAR PO) Take by mouth.     dapagliflozin propanediol (FARXIGA) 10 MG TABS tablet Take 1 tablet (10 mg total) by mouth daily. 90 tablet 3   spironolactone (ALDACTONE) 25 MG tablet Take 0.5 tablets (12.5 mg total) by mouth daily. 45 tablet 3   No current facility-administered medications for this encounter.    Allergies  Allergen Reactions   Cetirizine & Related Hives      Social History   Socioeconomic History   Marital status: Significant Other    Spouse name: Not on file   Number of children: 0   Years of education: Not on file   Highest education level: High school graduate  Occupational History    Comment: NO  Tobacco Use   Smoking status: Some Days    Packs/day: 0.50    Years: 5.00    Pack years: 2.50    Types: Cigarettes   Smokeless tobacco: Never   Tobacco comments:    Smoking cessation  Vaping Use   Vaping Use: Some days    Substances: Nicotine, THC, Flavoring, Mixture of cannabinoids  Substance and Sexual Activity   Alcohol use: Yes    Alcohol/week: 3.0 standard drinks    Types: 3 Shots of liquor per week    Comment: every day   Drug use: Not Currently    Types: Marijuana    Comment: January   Sexual activity: Not on file  Other Topics Concern   Not on file  Social History Narrative   Not on file   Social Determinants of Health   Financial Resource Strain: Low Risk    Difficulty of Paying Living Expenses: Not very hard  Food Insecurity: No Food Insecurity   Worried About Charity fundraiser in the Last Year: Never true   Ran Out of Food in the Last Year: Never true  Transportation Needs: No Transportation Needs   Lack of Transportation (Medical): No   Lack of Transportation (Non-Medical): No  Physical Activity: Not on file  Stress: Not on file  Social Connections: Not on file  Intimate Partner Violence: Not on file  Family History  Problem Relation Age of Onset   Healthy Mother    Healthy Father     Vitals:   06/27/21 1037  BP: 118/65  Pulse: (!) 104  SpO2: 98%  Weight: 62.1 kg (137 lb)     PHYSICAL EXAM: General:  Ambulated into clinic with assistive device HEENT: blind Neck: supple. no JVD. Carotids 2+ bilat; no bruits.  Cor: PMI nondisplaced. Regular rate & rhythm. No rubs, gallops or murmurs. Lungs: clear Abdomen: soft, nontender, nondistended.  Extremities: no cyanosis, clubbing, rash, edema Neuro: alert & orientedx3, cranial nerves grossly intact. moves all 4 extremities w/o difficulty. Tearful.   ECG: SR 77 bpm   ASSESSMENT & PLAN:  VF Cardiac Arrest - immediate bystander CPR. Full neurological recovery  - in setting of prolonged QT from hypokalemia/hypomagnesemia from chronic heavy ETOH use  - per EP, no indication for ICD at this time   2. Chronic Systolic Heart Failure - Vfib arrest 04/23. LVEF post arrest <20%, RV moderately reduced - Repeat  limited echo several days post arrest w/ interval improvement, EF up to 30-35% - cMRI w/ continued improvement, EF 42%. No evidence of myocarditis nor infiltrative disease  - suspect stunned myocardium post VF arrest. Unlikely ischemic at her age. - also ? ETOH CM +/- HTN CM (h/o poorly controlled HTN)  - NYHA Class I-II. Euvolemic on exam  - It is uncertain which meds she is taking. We will need to start over with medication titration. She has no system in place for managing medications. - Start Farxiga 10 mg daily - Start spiro 12.5 mg daily - Add back Entresto next then BB - ETOH abstinence imperative. Reiterated this again today w/ pt and mother - Reiterated importance of birth control given fetotoxic meds.  Missed appt w/ GYN on 5/2 to discuss contraception methods. Needs to reschedule. - Returning for labs 05/23, she is a difficult stick   3. Hypertension  - h/o poorly controlled HTN, improved w/ GDMT initiation - ? Contributory to CM - BP stable today, does not appear to be taking any medications regularly  4. ETOH Abuse - Has been drinking alcohol daily - Discussed importance of complete cessation - Suspect depression contributing  5. Hx Hypokalemia/Hypomagnesemia - check labs today   - continue K supplement  6. Seizures - on Keppra. Denies any seizures since discharge  - Follow with Neurology  7. Depression - Will refer for counseling  NYHA I-II GDMT  Diuretic- no loop diuretic currently needed  BB- No, as above Ace/ARB/ARNI No, as above MRA Spiro 12.5 mg daily  SGLT2i Farxiga 10 mg daily    Current known meds refilled at [place]. Farxiga 10 mg daily, Spiro 12.5 mg daily, Kdur 20 mEq BID, and Keppra 750 mg BID placed in pill box for her. It is unknown if she is taking the other medications on her list, she is not familiar with the prescriptions.   [facility] paramedicine to assist with medication compliance.   Referred to HFSW (PCP, Medications,  Transportation, ETOH Abuse, Drug Abuse, Insurance, Financial ): Yes, assisted with arranging pill box, referral to paramedicine, referral for counseling Refer to Pharmacy:  No Refer to Home Health: No Refer to Advanced Heart Failure Clinic: Yes  Refer to General Cardiology: No  Follow up APP clinic 2 weeks, Dr. Aundra Dubin in 2 months

## 2021-06-27 ENCOUNTER — Ambulatory Visit (HOSPITAL_COMMUNITY)
Admission: RE | Admit: 2021-06-27 | Discharge: 2021-06-27 | Disposition: A | Discharge status: Home / Self Care | Payer: Medicaid Other | Source: Ambulatory Visit | Attending: Physician Assistant | Admitting: Physician Assistant

## 2021-06-27 ENCOUNTER — Other Ambulatory Visit (HOSPITAL_COMMUNITY): Payer: Self-pay

## 2021-06-27 ENCOUNTER — Telehealth (HOSPITAL_COMMUNITY): Payer: Self-pay | Admitting: *Deleted

## 2021-06-27 VITALS — BP 118/65 | HR 104 | Wt 137.0 lb

## 2021-06-27 DIAGNOSIS — F32A Depression, unspecified: Secondary | ICD-10-CM | POA: Insufficient documentation

## 2021-06-27 DIAGNOSIS — I428 Other cardiomyopathies: Secondary | ICD-10-CM

## 2021-06-27 DIAGNOSIS — Z7983 Long term (current) use of bisphosphonates: Secondary | ICD-10-CM | POA: Diagnosis not present

## 2021-06-27 DIAGNOSIS — Z7902 Long term (current) use of antithrombotics/antiplatelets: Secondary | ICD-10-CM | POA: Diagnosis not present

## 2021-06-27 DIAGNOSIS — I110 Hypertensive heart disease with heart failure: Secondary | ICD-10-CM | POA: Insufficient documentation

## 2021-06-27 DIAGNOSIS — Z8679 Personal history of other diseases of the circulatory system: Secondary | ICD-10-CM | POA: Diagnosis not present

## 2021-06-27 DIAGNOSIS — H548 Legal blindness, as defined in USA: Secondary | ICD-10-CM | POA: Diagnosis not present

## 2021-06-27 DIAGNOSIS — E876 Hypokalemia: Secondary | ICD-10-CM | POA: Diagnosis not present

## 2021-06-27 DIAGNOSIS — I10 Essential (primary) hypertension: Secondary | ICD-10-CM

## 2021-06-27 DIAGNOSIS — F1010 Alcohol abuse, uncomplicated: Secondary | ICD-10-CM | POA: Diagnosis not present

## 2021-06-27 DIAGNOSIS — I5022 Chronic systolic (congestive) heart failure: Secondary | ICD-10-CM | POA: Insufficient documentation

## 2021-06-27 DIAGNOSIS — Z7984 Long term (current) use of oral hypoglycemic drugs: Secondary | ICD-10-CM | POA: Insufficient documentation

## 2021-06-27 DIAGNOSIS — R569 Unspecified convulsions: Secondary | ICD-10-CM | POA: Insufficient documentation

## 2021-06-27 DIAGNOSIS — I469 Cardiac arrest, cause unspecified: Secondary | ICD-10-CM | POA: Diagnosis present

## 2021-06-27 DIAGNOSIS — Z79899 Other long term (current) drug therapy: Secondary | ICD-10-CM | POA: Insufficient documentation

## 2021-06-27 DIAGNOSIS — I4901 Ventricular fibrillation: Secondary | ICD-10-CM | POA: Diagnosis not present

## 2021-06-27 MED ORDER — POTASSIUM CHLORIDE CRYS ER 20 MEQ PO TBCR
20.0000 meq | EXTENDED_RELEASE_TABLET | Freq: Two times a day (BID) | ORAL | 3 refills | Status: DC
  Filled 2021-06-27: qty 180, 90d supply, fill #0
  Filled 2021-10-24: qty 180, 90d supply, fill #1

## 2021-06-27 MED ORDER — SPIRONOLACTONE 25 MG PO TABS
12.5000 mg | ORAL_TABLET | Freq: Every day | ORAL | 3 refills | Status: DC
  Filled 2021-06-27: qty 45, 90d supply, fill #0

## 2021-06-27 MED ORDER — DAPAGLIFLOZIN PROPANEDIOL 10 MG PO TABS
10.0000 mg | ORAL_TABLET | Freq: Every day | ORAL | 3 refills | Status: DC
  Filled 2021-06-27: qty 90, 90d supply, fill #0
  Filled 2021-10-16: qty 90, 90d supply, fill #1

## 2021-06-27 NOTE — Telephone Encounter (Signed)
Heart Failure Nurse Navigator Progress Note    Unable to leave voice mail for appointment 06/27/21 @ 10 am . Patient mailbox full.   Earnestine Leys, BSN, Clinical cytogeneticist Only

## 2021-06-27 NOTE — Patient Instructions (Signed)
Medication Changes:  Stop Carvedilol.  Lab Work:  Labs done today, your results will be available in MyChart, we will contact you for abnormal readings.   Testing/Procedures:  none  Referrals:  You have been referred to the paramedicine team. They will contact you to arrange appointments.  Special Instructions // Education:  ONLY TAKE MEDICATIONS IN YOUR PILL BOX  Follow-Up in: 2 weeks with the nurse practitioner and 2   At the Advanced [facility] Failure Clinic, you and your health needs are our priority. We have a designated team specialized in the treatment of Heart Failure. This Care Team includes your primary Heart Failure Specialized Cardiologist (physician), Advanced Practice Providers (APPs- Physician Assistants and Nurse Practitioners), and Pharmacist who all work together to provide you with the care you need, when you need it.   You may see any of the following providers on your designated Care Team at your next follow up:  Dr Arvilla Meres Dr Carron Curie, NP Robbie Lis, [place] [facility], [place] Karle Plumber, PharmD   Please be sure to bring in all your medications bottles to every appointment.   Need to Contact us:  If you have any questions or concerns before your next appointment please send [place] a message through [place] or call our office at [phone].    TO LEAVE A MESSAGE FOR THE NURSE SELECT OPTION 2, PLEASE LEAVE A MESSAGE INCLUDING: YOUR NAME DATE OF BIRTH CALL BACK NUMBER REASON FOR CALL**this is important as we prioritize the call backs  YOU WILL RECEIVE A CALL BACK THE SAME DAY AS LONG AS YOU CALL BEFORE 4:00 PM

## 2021-06-27 NOTE — Progress Notes (Signed)
Heart and Vascular Care Navigation  06/27/2021  Beth Barnes [birth year] [id number]  Reason for Referral: Patient seen in HF [facility] clinic.    Engaged with patient face to face for initial visit for Heart and Vascular Care Coordination.                                                                                                   Assessment:  Patient is a 26 yo female who resides in a single family home with her boyfriend. Patient states she is blind and recently had "brain injury and they said that I died". Patient has no memory of recent event and states "I just want some normalcy". Patient spoke at length about her inability to remember things stating "wish my brain would work regular" and "I just feel tired and numb". Patient states she has not been taking her medications as she "needs a good way to take them".   Patient became tearful and shared that she continues to drink alcohol daily and "I don't even get anything out of it anymore". She shared that she sometimes "lose track of time and find myself staring at the wall for hours". She shared that her boyfriend comes home from work looking for dinner and she has "done nothing all day". "I used to be able to push it down" and move on but admits to feeling depressed.                                  HRT/VAS Care Coordination     Patients Home Cardiology Office Heart Failure Clinic  [facility]   Outpatient Care Team Social Worker   Social Worker Name: Raquel Sarna, [place] [phone]   Living arrangements for the past 2 months Single Family Home   Lives with: Significant Other   Patient Current Insurance Coverage Medicaid   Patient Has Concern With Paying Medical Bills No   Does Patient Have Prescription Coverage? Yes   Home Assistive Devices/Equipment Cane (specify quad or straight)       Social History:                                                                             SDOH Screenings   Alcohol Screen: Medium Risk   Last  Alcohol Screening Score (AUDIT): 22  Depression (PHQ2-9): Medium Risk   PHQ-2 Score: 7  Financial Resource Strain: Low Risk    Difficulty of Paying Living Expenses: Not very hard  Food Insecurity: No Food Insecurity   Worried About Charity fundraiser in the Last Year: Never true   Ran Out of Food in the Last Year: Never true  Housing: Low Risk    Last Housing Risk Score: 0  Physical Activity:  Not on file  Social Connections: Not on file  Stress: Not on file  Tobacco Use: High Risk   Smoking Tobacco Use: Some Days   Smokeless Tobacco Use: Never   Passive Exposure: Not on file  Transportation Needs: No Transportation Needs   Lack of Transportation (Medical): No   Lack of Transportation (Non-Medical): No    SDOH Interventions: Financial Resources:  Sales promotion account executive Interventions: Intervention Not Indicated   Food Insecurity:  Food Insecurity Interventions: Intervention Not Indicated  Housing Insecurity:  Housing Interventions: Intervention Not Indicated  Transportation:       Other Care Navigation Interventions:     Inpatient/Outpatient Substance Abuse Counseling/Rehab Options Discussed alcohol use and resources  Provided Pharmacy assistance resources  Pill box filled during visit with box labeled with tactile stickers and Community Paramedicine referral for follow up  Patient expressed Mental Health concerns Yes, Referred to:  Elias Else, [facility]  Patient Referred to:    Follow-up plan: Patient appears to have depressive symptoms and would benefit from some ongoing counseling. Patient states she can stop drinking on her own if she is able to get the supportive counseling. CSW and Clinic RN provided medication box and added some tactile stickers to assist patient with compliance as she is blind and had been unable to take meds as directed. CSW referred patient to [employer] as well for continued monitoring on medication and supportive needs. CSW ordered a braille  pill box and will send out with CP when arrives. CSW referred patient to Elias Else, [facility] for counseling. Patient agreeable to plan and grateful for the support and assistance. CSW will follow as needed. Raquel Sarna, [place], [place]

## 2021-06-30 ENCOUNTER — Telehealth (HOSPITAL_COMMUNITY): Payer: Self-pay | Admitting: Licensed Clinical Social Worker

## 2021-06-30 NOTE — Telephone Encounter (Signed)
CSW informed of need for pt to be enrolled in [employer].  CSW completed referral and sent out to paramedics for assignment.  CSW also informed by coworker that pt in need of mental health follow up and was agreeable to assistance with this.  CSW [employer] [facility] the [place] to confirm they accept pt insurance and how they could accommodate make pt an appt given her limitations with blindness- awaiting response then will follow up with pt directly  Will continue to follow and assist as needed  Burna Sis, LCSW Clinical Social Worker Advanced Heart Failure Clinic Desk#: [phone] Cell#: [phone]

## 2021-07-01 ENCOUNTER — Ambulatory Visit (HOSPITAL_COMMUNITY)
Admission: RE | Admit: 2021-07-01 | Discharge: 2021-07-01 | Disposition: A | Discharge status: Home / Self Care | Payer: Medicaid Other | Source: Ambulatory Visit | Attending: Cardiology | Admitting: Cardiology

## 2021-07-01 DIAGNOSIS — I509 Heart failure, unspecified: Secondary | ICD-10-CM | POA: Insufficient documentation

## 2021-07-01 LAB — BASIC METABOLIC PANEL
Anion gap: 14 (ref 5–15)
BUN: 12 mg/dL (ref 6–20)
CO2: 23 mmol/L (ref 22–32)
Calcium: 9.9 mg/dL (ref 8.9–10.3)
Chloride: 99 mmol/L (ref 98–111)
Creatinine, Ser: 0.87 mg/dL (ref 0.44–1.00)
GFR, Estimated: >60 mL/min (ref >60)
Glucose, Bld: 106 mg/dL — ABNORMAL HIGH (ref 70–99)
Potassium: 4.2 mmol/L (ref 3.5–5.1)
Sodium: 136 mmol/L (ref 135–145)

## 2021-07-01 LAB — BRAIN NATRIURETIC PEPTIDE: B Natriuretic Peptide: 6.5 pg/mL (ref 0.0–100.0)

## 2021-07-03 ENCOUNTER — Other Ambulatory Visit (HOSPITAL_COMMUNITY): Payer: Self-pay | Admitting: Emergency Medicine

## 2021-07-03 ENCOUNTER — Telehealth (HOSPITAL_COMMUNITY): Payer: Self-pay | Admitting: Licensed Clinical Social Worker

## 2021-07-03 NOTE — Progress Notes (Signed)
Paramedicine Encounter    Patient ID: Beth Barnes, female    DOB: 10/28/1995, 26 y.o.   MRN: [id number]  First home visit with Beth Barnes today.  She was A&O x 4.  She states she was glad that I was going to be able to help w/ her medicines.  She also states that she has had an upset stomach w/ vomiting that started Tuesday and hasn't been able to keep any of her medications down.  So she hasn't had any medications this week.  She will take her morning meds for today when I leave so she can eat something with her medicines.  She denies any chest pain or SOB.  No edema noted.  She complains of tiredness and says she has been sleeping more since she's been home from the hospital.  She wonders if there is a correlation with her medications and tiredness.  We discussed her alcohol use and she says she is trying to cut back.  I asked her if she would be interested in getting help with her alcoholism and she said she would think about it.  She has a prescription for folic acid but it hasn't been filled.  Called [facility] to get it delivered tomorrow.  Med box refilled.  Worked with her to identify shapes and sizes of the pills and what each were for.  Will meet with her again next Wednesday and I advised her to call me should she have further needs.  Also, she says she has a scale that talks to her but she's having trouble finding it so she hasn't weighed herself.    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   Acute systolic heart failure (HCC)    Cardiac arrest (HCC) 05/13/2021    Current Outpatient Medications:     acetaminophen (TYLENOL) 325 MG tablet, Take 1-2 tablets (325-650 mg total) by mouth every 4 (four) hours as needed for mild pain., Disp: , Rfl:    busPIRone HCl (BUSPAR PO), Take by mouth., Disp: , Rfl:    dapagliflozin propanediol (FARXIGA) 10 MG TABS tablet, Take 1 tablet (10 mg total) by mouth daily., Disp: 90 tablet, Rfl: 3   dorzolamide-timolol (COSOPT) 22.3-6.8 MG/ML ophthalmic solution, Place 1 drop into the left eye 2 (two) times daily., Disp: , Rfl:    folic acid (FOLVITE) 1 MG tablet, Take 1 tablet (1 mg total) by mouth daily., Disp: 90 tablet, Rfl: 3   levETIRAcetam (KEPPRA) 750 MG tablet, Take 1 tablet (750 mg total) by mouth 2 (two) times daily., Disp: 60 tablet, Rfl: 6   potassium chloride SA (KLOR-CON M) 20 MEQ tablet, Take 1 tablet (20 mEq total) by mouth 2 (two) times daily., Disp: 180 tablet, Rfl: 3   spironolactone (ALDACTONE) 25 MG tablet, Take 0.5 tablets (12.5 mg total) by mouth daily., Disp: 45 tablet, Rfl: 3   traZODone (DESYREL) 50 MG tablet, Take 2 tablets (100 mg total) by mouth at bedtime as needed for sleep., Disp: 180 tablet, Rfl: 3   triamcinolone cream (KENALOG) 0.1 %, Apply 2 x daily to the affected area., Disp: 30  g, Rfl: 3 Allergies  Allergen Reactions   Cetirizine & Related Hives      Social History   Socioeconomic History   Marital status: Significant Other    Spouse name: Not on file   Number of children: 0   Years of education: Not on file   Highest education level: High school graduate  Occupational History    Comment: NO  Tobacco Use   Smoking status: Some Days    Packs/day: 0.50    Years: 5.00    Pack years: 2.50    Types: Cigarettes   Smokeless tobacco: Never   Tobacco comments:    Smoking cessation  Vaping Use   Vaping Use: Some days   Substances: Nicotine, THC, Flavoring, Mixture of cannabinoids  Substance and Sexual Activity   Alcohol use: Yes    Alcohol/week: 3.0 standard drinks    Types: 3 Shots of liquor per week    Comment:  every day   Drug use: Not Currently    Types: Marijuana    Comment: January   Sexual activity: Not on file  Other Topics Concern   Not on file  Social History Narrative   Not on file   Social Determinants of Health   Financial Resource Strain: Low Risk    Difficulty of Paying Living Expenses: Not very hard  Food Insecurity: No Food Insecurity   Worried About Programme researcher, broadcasting/film/video in the Last Year: Never true   Ran Out of Food in the Last Year: Never true  Transportation Needs: No Transportation Needs   Lack of Transportation (Medical): No   Lack of Transportation (Non-Medical): No  Physical Activity: Not on file  Stress: Not on file  Social Connections: Not on file  Intimate Partner Violence: Not on file    Physical Exam      Future Appointments  Date Time Provider Department Center  07/24/2021 10:00 AM [facility] PA/NP [facility] None  09/04/2021 11:20 AM Laurey Morale, MD [facility] None  12/01/2021  1:45 PM Windell Norfolk, MD [facility] None       [name], EMT-Paramedic [phone] [facility] Paramedic  07/03/21

## 2021-07-03 NOTE — Progress Notes (Signed)
Heart and Vascular Care Navigation  07/03/2021  Beth Barnes 1995-03-02 [id number]  Reason for Referral:    Engaged with patient by telephone for initial visit for Heart and Vascular Care Coordination.                                                                                                   Assessment:       Paramedicine Initial Assessment:  Housing:  In what kind of housing do you live? House/apt/trailer/shelter? Lives in apt  Do you rent/pay a mortgage/own? rent  Do you live with anyone? boyfriend  Social:  What is your current marital status? single  Do you have any children? no  Income:  What is your current source of income? SSI  Insurance:  Are you currently insured? Medicaid  Do you have prescription coverage? yes  Transportation:  Do you have transportation to your medical appointments? Uses lyft   Daily Health Needs: Do you have a working scale at home? yes  How do you manage your medications at home? Using pill box that was filled in clinic  Do you have issues affording your medications? no  Do you have any concerns with mobility at home? no  Do you have a PCP? [facility]  Do you have any trouble reading or writing? Pt is blind  Are there any additional barriers you see to getting the care you need?  Pt admits to depression.  Saw a psychiatrist a few years ago but has never received regular counseling.  Willing to consider getting established- agreeable to referral being sent to [facility] the [place].  Pt reports she has not connected with the [facility] here- was connected when she lived in [place] but not interested in connecting here yet.  HRT/VAS Care Coordination     Patients Home Cardiology Office Heart Failure Clinic  [facility]   Outpatient Care Team Social Worker   Social Worker Name: Lasandra Beech, [place] [phone]   Living arrangements for the past 2 months Apartment   Lives with: Significant  Other   Patient Current Insurance Coverage Medicaid   Patient Has Concern With Paying Medical Bills No   Does Patient Have Prescription Coverage? Yes   Home Assistive Devices/Equipment Cane (specify quad or straight)       Social History:                                                                             SDOH Screenings   Alcohol Screen: Medium Risk   Last Alcohol Screening Score (AUDIT): 22  Depression (PHQ2-9): Medium Risk   PHQ-2 Score: 7  Financial Resource Strain: Low Risk    Difficulty of Paying Living Expenses: Not very hard  Food Insecurity: No Food Insecurity   Worried About Running  Out of Food in the Last Year: Never true   Ran Out of Food in the Last Year: Never true  Housing: Low Risk    Last Housing Risk Score: 0  Physical Activity: Not on file  Social Connections: Not on file  Stress: Not on file  Tobacco Use: High Risk   Smoking Tobacco Use: Some Days   Smokeless Tobacco Use: Never   Passive Exposure: Not on file  Transportation Needs: No Transportation Needs   Lack of Transportation (Medical): No   Lack of Transportation (Non-Medical): No    SDOH Interventions: Financial Resources:    Arboriculturist Insecurity:   None reported  Housing Insecurity:   None reported  Transportation:    Utilizes lyft/uber     Other Care Navigation Interventions:     Inpatient/Outpatient Substance Abuse Counseling/Rehab Options Not assessed  Provided Pharmacy assistance resources N/a  Patient expressed Mental Health concerns Yes, Referred to:  [facility]  Patient Referred to: [facility]   Follow-up plan:    Pt to get established with [facility] once they call her regarding referral.  Paramedic to follow pt and assist with connecting to other resources  Burna Sis, LCSW Clinical Social Worker [facility] Desk#: [phone] Cell#: [phone]

## 2021-07-10 ENCOUNTER — Other Ambulatory Visit (HOSPITAL_COMMUNITY): Payer: Self-pay | Admitting: Emergency Medicine

## 2021-07-10 NOTE — Progress Notes (Signed)
.Paramedicine Encounter  Patient ID: Beth Barnes, female    DOB: 1995/11/04, 26 y.o.   MRN: [id number]  Met with Ms. Vanzandt today and she was very emotional.  She began crying because feels so tired all the time and other people telling her she shouldn't be tired because they say she hasn't don't anything but lay around.  She says, " I want to be the way I used to be and have energy to do things instead of being tired all the time."  Pt. Denies chest pain or SOB, no edema to her lower extremities.  Lung sounds clear and equal bilat.  She missed only 2 doses of her pm medications.  Pill box reconciled and folic acid added.  Pt had issues at my last visit with nausea and vomiting.  She says that she is still having some irregularity with her period.  It is normally very heavy flow and long lasting.  Now she says her periods are shorter and she spots more.  She is sexually active  but not on birth control.  We discussed the possibility of her being pregnant and she says she doesn't want a baby right now and not with the person she is with.  We discussed me helping her get a pregnancy test so we can make plans for what the next steps need to be.  She does not want her boyfriend to know.  I am making arrangement to her a pregnancy test.   BP 120/80 (BP Location: Left Arm, Patient Position: Sitting, Cuff Size: Normal)   Pulse 97   Wt 133 lb 6.4 oz (60.5 kg)   SpO2 99%   BMI 21.53 kg/m  Weight yesterday-did not weigh Last visit weight-137  Patient Care Team: Patient, No Pcp Per (Inactive) as PCP - General (General Practice)  Patient Active Problem List   Diagnosis Date Noted   Heart failure ([place]) 05/20/2021   Blindness and low vision 05/18/2021   Cardiomyopathy, alcoholic ([place]) 54/49/2010   Acute systolic heart failure (HCC)    Cardiac arrest ([place]) 05/13/2021    Current Outpatient Medications:    acetaminophen (TYLENOL) 325 MG tablet, Take 1-2 tablets (325-650 mg total) by mouth every 4 (four)  hours as needed for mild pain., Disp: , Rfl:    dapagliflozin propanediol (FARXIGA) 10 MG TABS tablet, Take 1 tablet (10 mg total) by mouth daily., Disp: 90 tablet, Rfl: 3   dorzolamide-timolol (COSOPT) 22.3-6.8 MG/ML ophthalmic solution, Place 1 drop into the left eye 2 (two) times daily., Disp: , Rfl:    folic acid (FOLVITE) 1 MG tablet, Take 1 tablet (1 mg total) by mouth daily., Disp: 90 tablet, Rfl: 3   levETIRAcetam (KEPPRA) 750 MG tablet, Take 1 tablet (750 mg total) by mouth 2 (two) times daily., Disp: 60 tablet, Rfl: 6   potassium chloride SA (KLOR-CON M) 20 MEQ tablet, Take 1 tablet (20 mEq total) by mouth 2 (two) times daily., Disp: 180 tablet, Rfl: 3   spironolactone (ALDACTONE) 25 MG tablet, Take 0.5 tablets (12.5 mg total) by mouth daily., Disp: 45 tablet, Rfl: 3   busPIRone HCl (BUSPAR PO), Take by mouth. (Patient not taking: Reported on 07/10/2021), Disp: , Rfl:    traZODone (DESYREL) 50 MG tablet, Take 2 tablets (100 mg total) by mouth at bedtime as needed for sleep. (Patient not taking: Reported on 07/03/2021), Disp: 180 tablet, Rfl: 3   triamcinolone cream (KENALOG) 0.1 %, Apply 2 x daily to the affected area. (Patient not taking: Reported  on 07/10/2021), Disp: 30 g, Rfl: 3 Allergies  Allergen Reactions   Cetirizine & Related Hives      Social History   Socioeconomic History   Marital status: Significant Other    Spouse name: Not on file   Number of children: 0   Years of education: Not on file   Highest education level: High school graduate  Occupational History    Comment: NO  Tobacco Use   Smoking status: Some Days    Packs/day: 0.50    Years: 5.00    Pack years: 2.50    Types: Cigarettes   Smokeless tobacco: Never   Tobacco comments:    Smoking cessation  Vaping Use   Vaping Use: Some days   Substances: Nicotine, THC, Flavoring, Mixture of cannabinoids  Substance and Sexual Activity   Alcohol use: Yes    Alcohol/week: 3.0 standard drinks    Types: 3 Shots  of liquor per week    Comment: every day   Drug use: Not Currently    Types: Marijuana    Comment: January   Sexual activity: Not on file  Other Topics Concern   Not on file  Social History Narrative   Not on file   Social Determinants of Health   Financial Resource Strain: Low Risk    Difficulty of Paying Living Expenses: Not very hard  Food Insecurity: No Food Insecurity   Worried About Charity fundraiser in the Last Year: Never true   Ran Out of Food in the Last Year: Never true  Transportation Needs: No Transportation Needs   Lack of Transportation (Medical): No   Lack of Transportation (Non-Medical): No  Physical Activity: Not on file  Stress: Not on file  Social Connections: Not on file  Intimate Partner Violence: Not on file    Physical Exam      Future Appointments  Date Time Provider [place]  07/24/2021 10:00 AM [facility] PA/NP [facility] None  09/04/2021 11:20 AM Larey Dresser, MD [facility] None  12/01/2021  1:45 PM Alric Ran, MD [facility] None       [name], [facility] Paramedic  07/10/21

## 2021-07-17 ENCOUNTER — Other Ambulatory Visit (HOSPITAL_COMMUNITY): Payer: Self-pay | Admitting: Emergency Medicine

## 2021-07-17 NOTE — Progress Notes (Signed)
130lb my scale  Paramedicine Encounter    Patient ID: Beth Barnes, female    DOB: Sep 21, 1995, 26 y.o.   MRN: [id number]  Third home visit with Miss Minney.  She reports no chest pain or SOB.  She still complains of periods of fatigue that comes and goes.  She does well keeping her house clean and neat.  Lung sounds clear and equal bilat.  No edema and no JVD noted.  She only missed one dose of her evening meds out of a whole weeks worth.  She also reports that she is working on decreasing her alcohol consumption.  Next home visit scheduled 6/15 @ 1:00.   BP (!) 140/100 (BP Location: Left Arm, Patient Position: Sitting, Cuff Size: Normal)   Pulse 90   Resp 16   Wt 132 lb 3.2 oz (60 kg)   SpO2 90%   BMI 21.34 kg/m  Weight yesterday-131.4lb Last visit weight-133lb  Patient Care Team: Patient, No Pcp Per (Inactive) as PCP - General (General Practice)  Patient Active Problem List   Diagnosis Date Noted   Heart failure (HCC) 05/20/2021   Blindness and low vision 05/18/2021   Cardiomyopathy, alcoholic (HCC) 05/18/2021   Acute systolic heart failure (HCC)    Cardiac arrest (HCC) 05/13/2021    Current Outpatient Medications:    acetaminophen (TYLENOL) 325 MG tablet, Take 1-2 tablets (325-650 mg total) by mouth every 4 (four) hours as needed for mild pain., Disp: , Rfl:    busPIRone HCl (BUSPAR PO), Take by mouth. (Patient not taking: Reported on 07/10/2021), Disp: , Rfl:    dapagliflozin propanediol (FARXIGA) 10 MG TABS tablet, Take 1 tablet (10 mg total) by mouth daily., Disp: 90 tablet, Rfl: 3   dorzolamide-timolol (COSOPT) 22.3-6.8 MG/ML ophthalmic solution, Place 1 drop into the left eye 2 (two) times daily., Disp: , Rfl:    folic acid (FOLVITE) 1 MG tablet, Take 1 tablet (1 mg total) by mouth daily., Disp: 90 tablet, Rfl: 3   levETIRAcetam (KEPPRA) 750 MG tablet, Take 1 tablet (750 mg total) by mouth 2 (two) times daily., Disp: 60 tablet, Rfl: 6   potassium chloride SA (KLOR-CON M) 20  MEQ tablet, Take 1 tablet (20 mEq total) by mouth 2 (two) times daily., Disp: 180 tablet, Rfl: 3   spironolactone (ALDACTONE) 25 MG tablet, Take 0.5 tablets (12.5 mg total) by mouth daily., Disp: 45 tablet, Rfl: 3   traZODone (DESYREL) 50 MG tablet, Take 2 tablets (100 mg total) by mouth at bedtime as needed for sleep. (Patient not taking: Reported on 07/03/2021), Disp: 180 tablet, Rfl: 3   triamcinolone cream (KENALOG) 0.1 %, Apply 2 x daily to the affected area. (Patient not taking: Reported on 07/10/2021), Disp: 30 g, Rfl: 3 Allergies  Allergen Reactions   Cetirizine & Related Hives      Social History   Socioeconomic History   Marital status: Significant Other    Spouse name: Not on file   Number of children: 0   Years of education: Not on file   Highest education level: High school graduate  Occupational History    Comment: NO  Tobacco Use   Smoking status: Some Days    Packs/day: 0.50    Years: 5.00    Total pack years: 2.50    Types: Cigarettes   Smokeless tobacco: Never   Tobacco comments:    Smoking cessation  Vaping Use   Vaping Use: Some days   Substances: Nicotine, THC, Flavoring, Mixture of cannabinoids  Substance and Sexual Activity   Alcohol use: Yes    Alcohol/week: 3.0 standard drinks of alcohol    Types: 3 Shots of liquor per week    Comment: every day   Drug use: Not Currently    Types: Marijuana    Comment: January   Sexual activity: Not on file  Other Topics Concern   Not on file  Social History Narrative   Not on file   Social Determinants of Health   Financial Resource Strain: Low Risk  (06/27/2021)   Overall Financial Resource Strain (CARDIA)    Difficulty of Paying Living Expenses: Not very hard  Food Insecurity: No Food Insecurity (06/27/2021)   Hunger Vital Sign    Worried About Running Out of Food in the Last Year: Never true    Ran Out of Food in the Last Year: Never true  Transportation Needs: No Transportation Needs (06/27/2021)    PRAPARE - Administrator, Civil Service (Medical): No    Lack of Transportation (Non-Medical): No  Physical Activity: Not on file  Stress: Not on file  Social Connections: Not on file  Intimate Partner Violence: Not on file    Physical Exam      Future Appointments  Date Time Provider Department Center  07/24/2021 10:00 AM [facility] PA/NP [facility] None  09/04/2021 11:20 AM Laurey Morale, MD [facility] None  12/01/2021  1:45 PM Windell Norfolk, MD [facility] None       [name], EMT-Paramedic [phone] [facility] Paramedic  07/17/21

## 2021-07-18 ENCOUNTER — Telehealth (HOSPITAL_COMMUNITY): Payer: Self-pay | Admitting: Licensed Clinical Social Worker

## 2021-07-18 NOTE — Telephone Encounter (Signed)
Pt left message requesting help setting up ride to appt through Medicaid- CSW attempted to return call but unable to reach- left VM requesting return call  CSW will plan to call pt Monday to arrange ride  Burna Sis, LCSW Clinical Social Worker [facility] Desk#: [phone] Cell#: [phone]

## 2021-07-21 ENCOUNTER — Telehealth (HOSPITAL_COMMUNITY): Payer: Self-pay | Admitting: Licensed Clinical Social Worker

## 2021-07-21 NOTE — Telephone Encounter (Signed)
H&V Care Navigation CSW Progress Note  Clinical Social Worker contacted patient by phone to assist with transportatoin.  Patient is participating in a Managed Medicaid Plan:  Yes  SDOH Screenings   Alcohol Screen: Medium Risk (05/19/2021)   Alcohol Screen    Last Alcohol Screening Score (AUDIT): 22  Depression (PHQ2-9): Medium Risk (06/03/2021)   Depression (PHQ2-9)    PHQ-2 Score: 7  Financial Resource Strain: Low Risk  (06/27/2021)   Overall Financial Resource Strain (CARDIA)    Difficulty of Paying Living Expenses: Not very hard  Food Insecurity: No Food Insecurity (06/27/2021)   Hunger Vital Sign    Worried About Running Out of Food in the Last Year: Never true    Ran Out of Food in the Last Year: Never true  Housing: Low Risk  (06/27/2021)   Housing    Last Housing Risk Score: 0  Physical Activity: Not on file  Social Connections: Not on file  Stress: Not on file  Tobacco Use: High Risk (06/03/2021)   Patient History    Smoking Tobacco Use: Some Days    Smokeless Tobacco Use: Never    Passive Exposure: Not on file  Transportation Needs: No Transportation Needs (06/27/2021)   PRAPARE - Transportation    Lack of Transportation (Medical): No    Lack of Transportation (Non-Medical): No   CSW returned pt call requesting help with setting up medicaid transportation.  CSW assisted in setting up through pt [facility]- no further needs at this time.  Pick up from clinic appt scheduled for 11am  Will continue to follow and assist as needed  Burna Sis, LCSW Clinical Social Worker [facility] Desk#: [phone] Cell#: [phone]

## 2021-07-23 NOTE — Progress Notes (Incomplete)
ADVANCED HF CLINIC CONSULT NOTE  Referring Physician: Primary Care: Primary Cardiologist:  HPI: 26 y/o female who is blind w/ h/o HTN, w/ reported h/o significant ETOH abuse, who recently suffered witness cardiac arrest while visiting her boyfriend who was hospitalized at [facility]. RNs on the floor attended to her immediately. She was found to be in V-fib.  She did require epinephrine, cardioversion x2, sodium bicarb.  She was treated with Narcan. Achievement of ROSC after being down for ~10 min. She was intubated and admitted to ICU. Had seizures post arrest and treated w/ Keppra. Transferred for [facility] for further management.    Labs showed hypokalemia and hypomagnesemia and EKG w/ prolonged QT. No acute STE. Electrolyte abnormalties were corrected. Hs trop 280>>1,223>>3,101 but felt likely demand ischemia and not ACS. Of note, Boyfriend reported the pt drinks at least 1/5 of a gallon of liquor daily, also reported in another note by CCM hx of cocaine/marijuana as well (TOX + for benzos only, no ETOH level was done).  Initial Echo 4/5 showed severe biventricular failure, LVEF < 20%, RV moderately reduced. Repeat limited echo 4/10 showed interval improvement, w/ LVEF up to 30-35%    cMRI 4/11 showed mild septal hypertrophy 14 mm with mild global hypokinesis EF 42%. There was nonspecific mid myocardial gadolinium uptake on delayed inversion recovery sequences but no evidence of myocarditis or infiltration. Normal RV size and function. Normal cardiac valves. 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 Evaluated by EP. No indication for ICD. Started on GDMT   Referral placed to GYN- no showed appointment 05/02.   Today she returns for HF follow up.Overall feeling fine. Denies SOB/PND/Orthopnea. Appetite ok. No fever or chills. Weight at home  pounds. Taking all medications\         Review of Systems: [y] = yes, [ ]  = no   General: Weight gain [ ] ; Weight loss [ ] ; Anorexia [ ] ; Fatigue [ ] ; Fever [ ] ; Chills [ ] ; Weakness [ ]   Cardiac: Chest pain/pressure [ ] ; Resting SOB [ ] ; Exertional SOB [ ] ; Orthopnea [ ] ; Pedal Edema [ ] ; Palpitations [ ] ; Syncope [ ] ; Presyncope [ ] ; Paroxysmal nocturnal dyspnea[ ]   Pulmonary: Cough [ ] ; Wheezing[ ] ; Hemoptysis[ ] ; Sputum [ ] ; Snoring [ ]   GI: Vomiting[ ] ; Dysphagia[ ] ; Melena[ ] ; Hematochezia [ ] ; Heartburn[ ] ; Abdominal pain [ ] ; Constipation [ ] ; Diarrhea [ ] ; BRBPR [ ]   GU: Hematuria[ ] ; Dysuria [ ] ; Nocturia[ ]   Vascular: Pain in legs with walking [ ] ; Pain in feet with lying flat [ ] ; Non-healing sores [ ] ; Stroke [ ] ; TIA [ ] ; Slurred speech [ ] ;  Neuro: Headaches[ ] ; Vertigo[ ] ; Seizures[ ] ; Paresthesias[ ] ;Blurred vision [ ] ; Diplopia [ ] ; Vision changes [ ]   Ortho/Skin: Arthritis [ ] ; Joint pain [ ] ; Muscle pain [ ] ; Joint swelling [ ] ; Back Pain [ ] ; Rash [ ]   Psych: Depression[ ] ; Anxiety[ ]   Heme: Bleeding problems [ ] ; Clotting disorders [ ] ; Anemia [ ]   Endocrine: Diabetes [ ] ; Thyroid dysfunction[ ]    Past Medical History:  Diagnosis Date   Blind    Congenital glaucoma of both eyes    Hypokalemia     Current Outpatient Medications  Medication Sig Dispense Refill   acetaminophen (TYLENOL) 325 MG tablet Take 1-2 tablets (325-650 mg total) by mouth every 4 (four) hours as  needed for mild pain.     busPIRone HCl (BUSPAR PO) Take by mouth. (Patient not taking: Reported on 07/10/2021)     dapagliflozin propanediol (FARXIGA) 10 MG TABS tablet Take 1 tablet (10 mg total) by mouth daily. 90 tablet 3   dorzolamide-timolol (COSOPT) 22.3-6.8 MG/ML ophthalmic solution Place 1 drop into the left eye 2 (two) times daily.     folic acid (FOLVITE) 1 MG tablet Take 1 tablet (1 mg total) by mouth daily. 90 tablet 3   levETIRAcetam (KEPPRA) 750 MG tablet Take 1 tablet (750 mg total) by mouth 2 (two) times daily. 60  tablet 6   potassium chloride SA (KLOR-CON M) 20 MEQ tablet Take 1 tablet (20 mEq total) by mouth 2 (two) times daily. 180 tablet 3   spironolactone (ALDACTONE) 25 MG tablet Take 0.5 tablets (12.5 mg total) by mouth daily. 45 tablet 3   traZODone (DESYREL) 50 MG tablet Take 2 tablets (100 mg total) by mouth at bedtime as needed for sleep. (Patient not taking: Reported on 07/17/2021) 180 tablet 3   triamcinolone cream (KENALOG) 0.1 % Apply 2 x daily to the affected area. (Patient not taking: Reported on 07/10/2021) 30 g 3   No current facility-administered medications for this visit.    Allergies  Allergen Reactions   Cetirizine & Related Hives      Social History   Socioeconomic History   Marital status: Significant Other    Spouse name: Not on file   Number of children: 0   Years of education: Not on file   Highest education level: High school graduate  Occupational History    Comment: NO  Tobacco Use   Smoking status: Some Days    Packs/day: 0.50    Years: 5.00    Total pack years: 2.50    Types: Cigarettes   Smokeless tobacco: Never   Tobacco comments:    Smoking cessation  Vaping Use   Vaping Use: Some days   Substances: Nicotine, THC, Flavoring, Mixture of cannabinoids  Substance and Sexual Activity   Alcohol use: Yes    Alcohol/week: 3.0 standard drinks of alcohol    Types: 3 Shots of liquor per week    Comment: every day   Drug use: Not Currently    Types: Marijuana    Comment: January   Sexual activity: Not on file  Other Topics Concern   Not on file  Social History Narrative   Not on file   Social Determinants of Health   Financial Resource Strain: Low Risk  (06/27/2021)   Overall Financial Resource Strain (CARDIA)    Difficulty of Paying Living Expenses: Not very hard  Food Insecurity: No Food Insecurity (06/27/2021)   Hunger Vital Sign    Worried About Running Out of Food in the Last Year: Never true    Ran Out of Food in the Last Year: Never true   Transportation Needs: No Transportation Needs (06/27/2021)   PRAPARE - Administrator, Civil Service (Medical): No    Lack of Transportation (Non-Medical): No  Physical Activity: Not on file  Stress: Not on file  Social Connections: Not on file  Intimate Partner Violence: Not on file      Family History  Problem Relation Age of Onset   Healthy Mother    Healthy Father     There were no vitals filed for this visit.  PHYSICAL EXAM: General:  Well appearing. No respiratory difficulty HEENT: normal Neck: supple. no JVD. Carotids 2+  bilat; no bruits. No lymphadenopathy or thryomegaly appreciated. Cor: PMI nondisplaced. Regular rate & rhythm. No rubs, gallops or murmurs. Lungs: clear Abdomen: soft, nontender, nondistended. No hepatosplenomegaly. No bruits or masses. Good bowel sounds. Extremities: no cyanosis, clubbing, rash, edema Neuro: alert & oriented x 3, cranial nerves grossly intact. moves all 4 extremities w/o difficulty. Affect pleasant.  ECG:   ASSESSMENT & PLAN: VF Cardiac Arrest - immediate bystander CPR. Full neurological recovery  - in setting of prolonged QT from hypokalemia/hypomagnesemia from chronic heavy ETOH use  - per EP, no indication for ICD at this time    2. Chronic Systolic Heart Failure - Vfib arrest 04/23. LVEF post arrest <20%, RV moderately reduced - Repeat limited echo several days post arrest w/ interval improvement, EF up to 30-35% - cMRI w/ continued improvement, EF 42%. No evidence of myocarditis nor infiltrative disease  - suspect stunned myocardium post VF arrest. Unlikely ischemic at her age. - also ? ETOH CM +/- HTN CM (h/o poorly controlled HTN)  - NYHA Class I-II. - Continue  Farxiga 10 mg daily - Continue  spiro 12.5 mg daily - Add back Entresto next then BB - ETOH abstinence  - Reiterated importance of birth control given fetotoxic meds.   -    3. Hypertension  - h/o poorly controlled HTN, improved w/ GDMT  initiation - ? Contributory to CM   4. ETOH Abuse - Has been drinking alcohol daily - Discussed importance of complete cessation - Suspect depression contributing   5. Hx Hypokalemia/Hypomagnesemia - Check BMET and Mag   6. Seizures - on Keppra. Denies any seizures since discharge  - Follow with Neurology   7. Depression

## 2021-07-24 ENCOUNTER — Other Ambulatory Visit (HOSPITAL_COMMUNITY): Payer: Self-pay | Admitting: Emergency Medicine

## 2021-07-24 ENCOUNTER — Other Ambulatory Visit (HOSPITAL_COMMUNITY): Payer: Self-pay

## 2021-07-24 ENCOUNTER — Encounter (HOSPITAL_COMMUNITY): Payer: Medicaid Other

## 2021-07-24 NOTE — Progress Notes (Signed)
Paramedicine Encounter    Patient ID: Beth Barnes, female    DOB: Jan 09, 1996, 26 y.o.   MRN: [id number]   BP (!) 140/100 (BP Location: Right Arm, Patient Position: Sitting, Cuff Size: Normal)   Pulse 95   Resp 14   Wt 129 lb 12.8 oz (58.9 kg)   SpO2 97%   BMI 20.95 kg/m  Weight yesterday-not taken Last visit weight-132lbs  Met with [name] today and she reports to be feeling well.  No chest pain or SOB.  No edema or JVD.  She has done well with her medications and had only missed today's dose because  she's been sleeping and just woke up at 11:00 today.  Med box reconciled.  Called in refill for her Keppra at [facility].  She describes unusual sensation when she exhales, "it feels like something is rubbing together in my chest."  I can hear what she's talking about.  It does not move or changes when she cough.  She had an appointment at [facility] but missed her appointment because she overslept.  Rescheduled  for July 27th.  Discussed the importance of keeping her scheduled appointments as transportation has to be scheduled for her.   Also, pt. Desires to make a GYN visit with [facility].  She is interested in getting on birth control.  Attempted to call and help her get an appointment and had to leave message with an answering service who advised they would call her back.  I advised pt contact me should she have issues getting an appointment scheduled.   Patient Care Team: Patient, No Pcp Per as PCP - General (General Practice)  Patient Active Problem List   Diagnosis Date Noted   Heart failure ([place]) 05/20/2021   Blindness and low vision 05/18/2021   Cardiomyopathy, alcoholic ([place]) 82/42/3536   Acute systolic heart failure (HCC)    Cardiac arrest ([place]) 05/13/2021    Current Outpatient Medications:    acetaminophen (TYLENOL) 325 MG tablet, Take 1-2 tablets (325-650 mg total) by mouth every 4 (four) hours as needed for mild pain., Disp: , Rfl:    busPIRone  HCl (BUSPAR PO), Take by mouth. (Patient not taking: Reported on 07/10/2021), Disp: , Rfl:    dapagliflozin propanediol (FARXIGA) 10 MG TABS tablet, Take 1 tablet (10 mg total) by mouth daily., Disp: 90 tablet, Rfl: 3   dorzolamide-timolol (COSOPT) 22.3-6.8 MG/ML ophthalmic solution, Place 1 drop into the left eye 2 (two) times daily., Disp: , Rfl:    folic acid (FOLVITE) 1 MG tablet, Take 1 tablet (1 mg total) by mouth daily., Disp: 90 tablet, Rfl: 3   levETIRAcetam (KEPPRA) 750 MG tablet, Take 1 tablet (750 mg total) by mouth 2 (two) times daily., Disp: 60 tablet, Rfl: 6   potassium chloride SA (KLOR-CON M) 20 MEQ tablet, Take 1 tablet (20 mEq total) by mouth 2 (two) times daily., Disp: 180 tablet, Rfl: 3   spironolactone (ALDACTONE) 25 MG tablet, Take 0.5 tablets (12.5 mg total) by mouth daily., Disp: 45 tablet, Rfl: 3   traZODone (DESYREL) 50 MG tablet, Take 2 tablets (100 mg total) by mouth at bedtime as needed for sleep. (Patient not taking: Reported on 07/17/2021), Disp: 180 tablet, Rfl: 3   triamcinolone cream (KENALOG) 0.1 %, Apply 2 x daily to the affected area. (Patient not taking: Reported on 07/10/2021), Disp: 30 g, Rfl: 3 Allergies  Allergen Reactions   Cetirizine & Related Hives      Social History  Socioeconomic History   Marital status: Significant Other    Spouse name: Not on file   Number of children: 0   Years of education: Not on file   Highest education level: High school graduate  Occupational History    Comment: NO  Tobacco Use   Smoking status: Some Days    Packs/day: 0.50    Years: 5.00    Total pack years: 2.50    Types: Cigarettes   Smokeless tobacco: Never   Tobacco comments:    Smoking cessation  Vaping Use   Vaping Use: Some days   Substances: Nicotine, THC, Flavoring, Mixture of cannabinoids  Substance and Sexual Activity   Alcohol use: Yes    Alcohol/week: 3.0 standard drinks of alcohol    Types: 3 Shots of liquor per week    Comment: every day    Drug use: Not Currently    Types: Marijuana    Comment: January   Sexual activity: Not on file  Other Topics Concern   Not on file  Social History Narrative   Not on file   Social Determinants of Health   Financial Resource Strain: Low Risk  (06/27/2021)   Overall Financial Resource Strain (CARDIA)    Difficulty of Paying Living Expenses: Not very hard  Food Insecurity: No Food Insecurity (06/27/2021)   Hunger Vital Sign    Worried About Running Out of Food in the Last Year: Never true    Ran Out of Food in the Last Year: Never true  Transportation Needs: No Transportation Needs (06/27/2021)   PRAPARE - Hydrologist (Medical): No    Lack of Transportation (Non-Medical): No  Physical Activity: Not on file  Stress: Not on file  Social Connections: Not on file  Intimate Partner Violence: Not on file    Physical Exam      Future Appointments  Date Time Provider [place]  09/04/2021 11:20 AM Larey Dresser, MD [facility] None  12/01/2021  1:45 PM Alric Ran, MD [facility] None       [name], [place] [facility] Paramedic  07/24/21

## 2021-07-31 ENCOUNTER — Telehealth: Payer: Self-pay | Admitting: Family Medicine

## 2021-07-31 NOTE — Telephone Encounter (Signed)
Called patient to schedule appointment for birth control, there was no answer to the phone call so a voicemail was left with the call back number for the office.

## 2021-08-01 ENCOUNTER — Other Ambulatory Visit (HOSPITAL_COMMUNITY): Payer: Self-pay | Admitting: Emergency Medicine

## 2021-08-01 NOTE — Progress Notes (Signed)
Paramedicine Encounter    Patient ID: Beth Barnes, female    DOB: 1995-02-27, 26 y.o.   MRN: [id number]   BP 140/90 (BP Location: Right Arm, Patient Position: Sitting, Cuff Size: Normal)   Pulse 73   Resp 14   Wt 125 lb (56.7 kg)   SpO2 99%   BMI 20.18 kg/m  Weight yesterday-125lb Last visit weight-129lb   ATF Ms. Bethards complaining today of nausea and vomiting since yesterday.  Her last that she was able to keep down was Wednesday night.  She says today she has been able to sip a little bit of fluids and is going to try some chicken broth.  No chest pain or SOB.  No abdominal pain and no diarrhea.  No edema noted to her lower extremities.  She missed two doses from her pill box.  Meds were reconciled w/o incident.  She and I have been trying to get set up with a GYN visit to get started on birthcontrol but we have been unable to get in contact with the office. The office closes at 11:00 on Friday.  Will try again next next week to get this set up.    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   Acute systolic heart failure (HCC)    Cardiac arrest (HCC) 05/13/2021    Current Outpatient Medications:    acetaminophen (TYLENOL) 325 MG tablet, Take 1-2 tablets (325-650 mg total) by mouth every 4 (four) hours as needed for mild pain., Disp: , Rfl:    busPIRone HCl (BUSPAR PO), Take by mouth., Disp: , Rfl:    dapagliflozin propanediol (FARXIGA) 10 MG TABS tablet, Take 1 tablet (10 mg total) by mouth daily., Disp: 90 tablet, Rfl: 3   dorzolamide-timolol (COSOPT) 22.3-6.8 MG/ML ophthalmic solution, Place 1 drop into the left eye 2 (two) times daily., Disp: , Rfl:    folic acid (FOLVITE) 1 MG tablet, Take 1 tablet (1 mg total) by mouth daily., Disp: 90 tablet, Rfl: 3   levETIRAcetam (KEPPRA)  750 MG tablet, Take 1 tablet (750 mg total) by mouth 2 (two) times daily., Disp: 60 tablet, Rfl: 6   potassium chloride SA (KLOR-CON M) 20 MEQ tablet, Take 1 tablet (20 mEq total) by mouth 2 (two) times daily., Disp: 180 tablet, Rfl: 3   spironolactone (ALDACTONE) 25 MG tablet, Take 0.5 tablets (12.5 mg total) by mouth daily., Disp: 45 tablet, Rfl: 3   traZODone (DESYREL) 50 MG tablet, Take 2 tablets (100 mg total) by mouth at bedtime as needed for sleep. (Patient not taking: Reported on 07/17/2021), Disp: 180 tablet, Rfl: 3   triamcinolone cream (KENALOG) 0.1 %, Apply 2 x daily to the affected area. (Patient not taking: Reported on 07/10/2021), Disp: 30 g, Rfl: 3 Allergies  Allergen Reactions   Cetirizine & Related Hives      Social History   Socioeconomic History   Marital status: Significant Other    Spouse name: Not on file   Number of children: 0   Years of education: Not on file   Highest education level: High school graduate  Occupational History    Comment: NO  Tobacco Use   Smoking status: Some Days    Packs/day: 0.50    Years: 5.00    Total pack years: 2.50  Types: Cigarettes   Smokeless tobacco: Never   Tobacco comments:    Smoking cessation  Vaping Use   Vaping Use: Some days   Substances: Nicotine, THC, Flavoring, Mixture of cannabinoids  Substance and Sexual Activity   Alcohol use: Yes    Alcohol/week: 3.0 standard drinks of alcohol    Types: 3 Shots of liquor per week    Comment: every day   Drug use: Not Currently    Types: Marijuana    Comment: January   Sexual activity: Not on file  Other Topics Concern   Not on file  Social History Narrative   Not on file   Social Determinants of Health   Financial Resource Strain: Low Risk  (06/27/2021)   Overall Financial Resource Strain (CARDIA)    Difficulty of Paying Living Expenses: Not very hard  Food Insecurity: No Food Insecurity (06/27/2021)   Hunger Vital Sign    Worried About Running Out of Food in the  Last Year: Never true    Ran Out of Food in the Last Year: Never true  Transportation Needs: No Transportation Needs (06/27/2021)   PRAPARE - Administrator, Civil Service (Medical): No    Lack of Transportation (Non-Medical): No  Physical Activity: Not on file  Stress: Not on file  Social Connections: Not on file  Intimate Partner Violence: Not on file    Physical Exam      Future Appointments  Date Time Provider Department Center  09/04/2021 11:20 AM Laurey Morale, MD [facility] None  12/01/2021  1:45 PM Windell Norfolk, MD [facility] None       [name], EMT-Paramedic [phone] [facility] Paramedic  08/01/21

## 2021-08-08 ENCOUNTER — Other Ambulatory Visit (HOSPITAL_COMMUNITY): Payer: Self-pay | Admitting: Emergency Medicine

## 2021-08-08 NOTE — Progress Notes (Signed)
Paramedicine Encounter    Patient ID: Beth Barnes, female    DOB: 08/04/1995, 26 y.o.   MRN: [id number]   BP 140/80 (BP Location: Left Arm, Patient Position: Sitting, Cuff Size: Normal)   Pulse 62   Resp 16   Wt 128 lb (58.1 kg)   SpO2 99%   BMI 20.66 kg/m  Weight yesterday-not taken Last visit weight-125lb  ATF Ms. Needles A&O x 4, skin warm and dry with good color.  Pt reports to be feeling well today.  No chest pain or SOB.  Lung sounds clear and equal bilat.  No edema noted to her lower extremities.  She missed only one PM dose of her meds for last week.  Meds reconciled and called in refill for Folic Acid.  She is still trying to get a GYN appointment so that she can get on birthcontrol.  She is considering an IUD if that is an option.  Home visit complete.   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   Acute systolic heart failure (HCC)    Cardiac arrest (HCC) 05/13/2021    Current Outpatient Medications:    acetaminophen (TYLENOL) 325 MG tablet, Take 1-2 tablets (325-650 mg total) by mouth every 4 (four) hours as needed for mild pain., Disp: , Rfl:    dapagliflozin propanediol (FARXIGA) 10 MG TABS tablet, Take 1 tablet (10 mg total) by mouth daily., Disp: 90 tablet, Rfl: 3   dorzolamide-timolol (COSOPT) 22.3-6.8 MG/ML ophthalmic solution, Place 1 drop into the left eye 2 (two) times daily., Disp: , Rfl:    folic acid (FOLVITE) 1 MG tablet, Take 1 tablet (1 mg total) by mouth daily., Disp: 90 tablet, Rfl: 3   levETIRAcetam (KEPPRA) 750 MG tablet, Take 1 tablet (750 mg total) by mouth 2 (two) times daily., Disp: 60 tablet, Rfl: 6   potassium chloride SA (KLOR-CON M) 20 MEQ tablet, Take 1 tablet (20 mEq total) by mouth 2 (two) times daily., Disp: 180 tablet, Rfl: 3    spironolactone (ALDACTONE) 25 MG tablet, Take 0.5 tablets (12.5 mg total) by mouth daily., Disp: 45 tablet, Rfl: 3   busPIRone HCl (BUSPAR PO), Take by mouth. (Patient not taking: Reported on 08/01/2021), Disp: , Rfl:    traZODone (DESYREL) 50 MG tablet, Take 2 tablets (100 mg total) by mouth at bedtime as needed for sleep. (Patient not taking: Reported on 07/17/2021), Disp: 180 tablet, Rfl: 3   triamcinolone cream (KENALOG) 0.1 %, Apply 2 x daily to the affected area. (Patient not taking: Reported on 07/10/2021), Disp: 30 g, Rfl: 3 Allergies  Allergen Reactions   Cetirizine & Related Hives      Social History   Socioeconomic History   Marital status: Significant Other    Spouse name: Not on file   Number of children: 0   Years of education: Not on file   Highest education level: High school graduate  Occupational History    Comment: NO  Tobacco Use   Smoking status: Some Days    Packs/day: 0.50    Years: 5.00    Total pack years: 2.50    Types: Cigarettes   Smokeless tobacco: Never   Tobacco comments:    Smoking cessation  Vaping Use   Vaping Use: Some days   Substances: Nicotine, THC, Flavoring,  Mixture of cannabinoids  Substance and Sexual Activity   Alcohol use: Yes    Alcohol/week: 3.0 standard drinks of alcohol    Types: 3 Shots of liquor per week    Comment: every day   Drug use: Not Currently    Types: Marijuana    Comment: January   Sexual activity: Not on file  Other Topics Concern   Not on file  Social History Narrative   Not on file   Social Determinants of Health   Financial Resource Strain: Low Risk  (06/27/2021)   Overall Financial Resource Strain (CARDIA)    Difficulty of Paying Living Expenses: Not very hard  Food Insecurity: No Food Insecurity (06/27/2021)   Hunger Vital Sign    Worried About Running Out of Food in the Last Year: Never true    Ran Out of Food in the Last Year: Never true  Transportation Needs: No Transportation Needs (06/27/2021)    PRAPARE - Administrator, Civil Service (Medical): No    Lack of Transportation (Non-Medical): No  Physical Activity: Not on file  Stress: Not on file  Social Connections: Not on file  Intimate Partner Violence: Not on file    Physical Exam      Future Appointments  Date Time Provider Department Center  09/04/2021 11:20 AM Laurey Morale, MD [facility] None  12/01/2021  1:45 PM Windell Norfolk, MD [facility] None       [name], EMT-Paramedic [phone] [facility] Paramedic  08/08/21

## 2021-08-15 ENCOUNTER — Other Ambulatory Visit (HOSPITAL_COMMUNITY): Payer: Self-pay | Admitting: Emergency Medicine
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
## 2021-08-18 ENCOUNTER — Telehealth (HOSPITAL_COMMUNITY): Payer: Self-pay | Admitting: Licensed Clinical Social Worker

## 2021-08-18 ENCOUNTER — Telehealth (HOSPITAL_COMMUNITY): Payer: Self-pay | Admitting: *Deleted

## 2021-08-18 MED ORDER — ENTRESTO 24-26 MG PO TABS: 1.0000 | ORAL_TABLET | Freq: Two times a day (BID) | ORAL | 3 refills | Status: DC
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
## 2021-08-19 ENCOUNTER — Other Ambulatory Visit (HOSPITAL_COMMUNITY): Payer: Self-pay | Admitting: *Deleted

## 2021-08-19 ENCOUNTER — Telehealth (HOSPITAL_COMMUNITY): Payer: Self-pay | Admitting: Emergency Medicine

## 2021-08-19 MED ORDER — ENTRESTO 24-26 MG PO TABS: 1.0000 | ORAL_TABLET | Freq: Two times a day (BID) | ORAL | 3 refills | Status: DC

## 2021-08-21 ENCOUNTER — Other Ambulatory Visit (HOSPITAL_COMMUNITY): Payer: Self-pay | Admitting: Emergency Medicine

## 2021-08-21 NOTE — Progress Notes (Signed)
Paramedicine Encounter    Patient ID: Beth Barnes, female    DOB: May 07, 1995, 26 y.o.   MRN: [id number]   BP (!) 140/100 (BP Location: Left Arm, Patient Position: Sitting, Cuff Size: Normal)   Pulse 77   Resp 16   Wt 125 lb (56.7 kg)   SpO2 99%   BMI 20.18 kg/m  Weight yesterday-did not weigh Last visit weight-128lb  Home visit with Ms. Jimmerson today.  She reports to be feeling well. No chest pain or SOB.  Lung sounds clear and equal bilat.  No edema noted and no JVD.  Med box reconciled.  Added Entresto 24/26 BID beginning today.  She is interested in getting assistance to stop drinking alcohol.  Will reach out to get information for her of resources available.  She is still planning on going to visit her mother in [place] the first week in August.  We discussed plans of setting up a month supply of her prescriptions for her visit.  No other needs at this time.  Home visit  complete.  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   Acute systolic heart failure (HCC)    Cardiac arrest (HCC) 05/13/2021    Current Outpatient Medications:    acetaminophen (TYLENOL) 325 MG tablet, Take 1-2 tablets (325-650 mg total) by mouth every 4 (four) hours as needed for mild pain., Disp: , Rfl:    dapagliflozin propanediol (FARXIGA) 10 MG TABS tablet, Take 1 tablet (10 mg total) by mouth daily., Disp: 90 tablet, Rfl: 3   dorzolamide-timolol (COSOPT) 22.3-6.8 MG/ML ophthalmic solution, Place 1 drop into the left eye 2 (two) times daily., Disp: , Rfl:    folic acid (FOLVITE) 1 MG tablet, Take 1 tablet (1 mg total) by mouth daily., Disp: 90 tablet, Rfl: 3   levETIRAcetam (KEPPRA) 750 MG tablet, Take 1 tablet (750 mg total) by mouth 2 (two) times daily., Disp: 60 tablet, Rfl: 6   potassium chloride SA  (KLOR-CON M) 20 MEQ tablet, Take 1 tablet (20 mEq total) by mouth 2 (two) times daily., Disp: 180 tablet, Rfl: 3   sacubitril-valsartan (ENTRESTO) 24-26 MG, Take 1 tablet by mouth 2 (two) times daily., Disp: 60 tablet, Rfl: 3   spironolactone (ALDACTONE) 25 MG tablet, Take 0.5 tablets (12.5 mg total) by mouth daily., Disp: 45 tablet, Rfl: 3   busPIRone HCl (BUSPAR PO), Take by mouth. (Patient not taking: Reported on 08/01/2021), Disp: , Rfl:    traZODone (DESYREL) 50 MG tablet, Take 2 tablets (100 mg total) by mouth at bedtime as needed for sleep. (Patient not taking: Reported on 08/21/2021), Disp: 180 tablet, Rfl: 3   triamcinolone cream (KENALOG) 0.1 %, Apply 2 x daily to the affected area. (Patient not taking: Reported on 08/21/2021), Disp: 30 g, Rfl: 3 Allergies  Allergen Reactions   Cetirizine & Related Hives      Social History   Socioeconomic History   Marital status: Significant Other    Spouse name: Not on file   Number of children: 0   Years of education: Not on file   Highest education level: High school graduate  Occupational History    Comment: NO  Tobacco Use   Smoking status: Some Days    Packs/day: 0.50    Years: 5.00    Total pack  years: 2.50    Types: Cigarettes   Smokeless tobacco: Never   Tobacco comments:    Smoking cessation  Vaping Use   Vaping Use: Some days   Substances: Nicotine, THC, Flavoring, Mixture of cannabinoids  Substance and Sexual Activity   Alcohol use: Yes    Alcohol/week: 3.0 standard drinks of alcohol    Types: 3 Shots of liquor per week    Comment: every day   Drug use: Not Currently    Types: Marijuana    Comment: January   Sexual activity: Not on file  Other Topics Concern   Not on file  Social History Narrative   Not on file   Social Determinants of Health   Financial Resource Strain: Low Risk  (06/27/2021)   Overall Financial Resource Strain (CARDIA)    Difficulty of Paying Living Expenses: Not very hard  Food Insecurity:  No Food Insecurity (06/27/2021)   Hunger Vital Sign    Worried About Running Out of Food in the Last Year: Never true    Ran Out of Food in the Last Year: Never true  Transportation Needs: No Transportation Needs (06/27/2021)   PRAPARE - Administrator, Civil Service (Medical): No    Lack of Transportation (Non-Medical): No  Physical Activity: Not on file  Stress: Not on file  Social Connections: Not on file  Intimate Partner Violence: Not on file    Physical Exam      Future Appointments  Date Time Provider Department Center  09/04/2021 11:20 AM Laurey Morale, MD [facility] None  12/01/2021  1:45 PM Windell Norfolk, MD [facility] None       [name], EMT-Paramedic [phone] [facility] Paramedic  08/21/21

## 2021-08-27 ENCOUNTER — Telehealth (HOSPITAL_COMMUNITY): Payer: Self-pay | Admitting: Licensed Clinical Social Worker

## 2021-08-27 NOTE — Telephone Encounter (Signed)
H&V Care Navigation CSW Progress Note  Clinical Social Worker contacted patient by phone to discuss alcohol abuse and mental health counseling.  Patient is participating in a Managed Medicaid Plan:  Yes  SDOH Screenings   Alcohol Screen: Medium Risk (05/19/2021)   Alcohol Screen    Last Alcohol Screening Score (AUDIT): 22  Depression (PHQ2-9): Medium Risk (06/03/2021)   Depression (PHQ2-9)    PHQ-2 Score: 7  Financial Resource Strain: Low Risk  (06/27/2021)   Overall Financial Resource Strain (CARDIA)    Difficulty of Paying Living Expenses: Not very hard  Food Insecurity: No Food Insecurity (06/27/2021)   Hunger Vital Sign    Worried About Running Out of Food in the Last Year: Never true    Ran Out of Food in the Last Year: Never true  Housing: Low Risk  (06/27/2021)   Housing    Last Housing Risk Score: 0  Physical Activity: Not on file  Social Connections: Not on file  Stress: Not on file  Tobacco Use: High Risk (06/03/2021)   Patient History    Smoking Tobacco Use: Some Days    Smokeless Tobacco Use: Never    Passive Exposure: Not on file  Transportation Needs: No Transportation Needs (06/27/2021)   PRAPARE - Transportation    Lack of Transportation (Medical): No    Lack of Transportation (Non-Medical): No   CSW spoke with [facility] regarding pt situation and they can accommodate pt needs and work with her to access services.  CSW spoke with pt who request number to call to discuss services and getting established with [facility] referral coordinator.  Will continue to follow and assist as needed  Burna Sis, LCSW Clinical Social Worker [facility] Desk#: [phone] Cell#: [phone]

## 2021-08-28 ENCOUNTER — Other Ambulatory Visit (HOSPITAL_COMMUNITY): Payer: Self-pay | Admitting: Emergency Medicine

## 2021-08-28 NOTE — Progress Notes (Signed)
Paramedicine Encounter    Patient ID: Beth Barnes, female    DOB: 28-Jan-1996, 26 y.o.   MRN: [id number]   There were no vitals taken for this visit. Weight yesterday-not taken Last visit weight-125lb  Home visit for Beth Barnes she is A&O x 4, skin W&D w/ good color.  She denies chest pain or SOB.  Lung sounds clear and equal bilat.  No edema or JVD noted.  Blood pressure was up during visit but she advised she had just gotten up and had not yet taken her morning medications.  Pt. Missed 3 p.m. doses of her meds.  Med box reconciled.  She is planning a month long visit with her mother next month in [place].  I discussed getting her meds set up in bubble pack and she says she prefers the pill box.  Home visit complete.  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   Acute systolic heart failure (HCC)    Cardiac arrest (HCC) 05/13/2021    Current Outpatient Medications:    acetaminophen (TYLENOL) 325 MG tablet, Take 1-2 tablets (325-650 mg total) by mouth every 4 (four) hours as needed for mild pain., Disp: , Rfl:    busPIRone HCl (BUSPAR PO), Take by mouth. (Patient not taking: Reported on 08/01/2021), Disp: , Rfl:    dapagliflozin propanediol (FARXIGA) 10 MG TABS tablet, Take 1 tablet (10 mg total) by mouth daily., Disp: 90 tablet, Rfl: 3   dorzolamide-timolol (COSOPT) 22.3-6.8 MG/ML ophthalmic solution, Place 1 drop into the left eye 2 (two) times daily., Disp: , Rfl:    folic acid (FOLVITE) 1 MG tablet, Take 1 tablet (1 mg total) by mouth daily., Disp: 90 tablet, Rfl: 3   levETIRAcetam (KEPPRA) 750 MG tablet, Take 1 tablet (750 mg total) by mouth 2 (two) times daily., Disp: 60 tablet, Rfl: 6   potassium chloride SA (KLOR-CON M) 20 MEQ tablet, Take 1 tablet (20 mEq total) by mouth 2 (two)  times daily., Disp: 180 tablet, Rfl: 3   sacubitril-valsartan (ENTRESTO) 24-26 MG, Take 1 tablet by mouth 2 (two) times daily., Disp: 60 tablet, Rfl: 3   spironolactone (ALDACTONE) 25 MG tablet, Take 0.5 tablets (12.5 mg total) by mouth daily., Disp: 45 tablet, Rfl: 3   traZODone (DESYREL) 50 MG tablet, Take 2 tablets (100 mg total) by mouth at bedtime as needed for sleep. (Patient not taking: Reported on 08/21/2021), Disp: 180 tablet, Rfl: 3   triamcinolone cream (KENALOG) 0.1 %, Apply 2 x daily to the affected area. (Patient not taking: Reported on 08/21/2021), Disp: 30 g, Rfl: 3 Allergies  Allergen Reactions   Cetirizine & Related Hives      Social History   Socioeconomic History   Marital status: Significant Other    Spouse name: Not on file   Number of children: 0   Years of education: Not on file   Highest education level: High school graduate  Occupational History    Comment: NO  Tobacco Use   Smoking status: Some Days    Packs/day: 0.50    Years: 5.00    Total pack years: 2.50    Types: Cigarettes   Smokeless tobacco: Never   Tobacco comments:    Smoking cessation  Vaping Use   Vaping Use: Some days   Substances: Nicotine,  THC, Flavoring, Mixture of cannabinoids  Substance and Sexual Activity   Alcohol use: Yes    Alcohol/week: 3.0 standard drinks of alcohol    Types: 3 Shots of liquor per week    Comment: every day   Drug use: Not Currently    Types: Marijuana    Comment: January   Sexual activity: Not on file  Other Topics Concern   Not on file  Social History Narrative   Not on file   Social Determinants of Health   Financial Resource Strain: Low Risk  (06/27/2021)   Overall Financial Resource Strain (CARDIA)    Difficulty of Paying Living Expenses: Not very hard  Food Insecurity: No Food Insecurity (06/27/2021)   Hunger Vital Sign    Worried About Running Out of Food in the Last Year: Never true    Ran Out of Food in the Last Year: Never true   Transportation Needs: No Transportation Needs (06/27/2021)   PRAPARE - Administrator, Civil Service (Medical): No    Lack of Transportation (Non-Medical): No  Physical Activity: Not on file  Stress: Not on file  Social Connections: Not on file  Intimate Partner Violence: Not on file    Physical Exam      Future Appointments  Date Time Provider Department Center  09/04/2021 11:20 AM Laurey Morale, MD [facility] None  12/01/2021  1:45 PM Windell Norfolk, MD [facility] None       [name], EMT-Paramedic [phone] [facility] Paramedic  08/28/21

## 2021-08-28 NOTE — Progress Notes (Signed)
Epic locked up and this encounter got created in error.

## 2021-09-04 ENCOUNTER — Telehealth (HOSPITAL_COMMUNITY): Payer: Self-pay | Admitting: Emergency Medicine

## 2021-09-04 ENCOUNTER — Other Ambulatory Visit (HOSPITAL_COMMUNITY): Payer: Self-pay | Admitting: *Deleted

## 2021-09-04 ENCOUNTER — Other Ambulatory Visit (HOSPITAL_COMMUNITY): Payer: Self-pay | Admitting: Emergency Medicine

## 2021-09-04 ENCOUNTER — Ambulatory Visit (HOSPITAL_COMMUNITY)
Admission: RE | Admit: 2021-09-04 | Discharge: 2021-09-04 | Disposition: A | Discharge status: Home / Self Care | Payer: Medicaid Other | Source: Ambulatory Visit | Attending: Cardiology | Admitting: Cardiology

## 2021-09-04 ENCOUNTER — Encounter (HOSPITAL_COMMUNITY): Payer: Self-pay | Admitting: Cardiology

## 2021-09-04 VITALS — BP 118/78 | HR 85 | Wt 123.6 lb

## 2021-09-04 DIAGNOSIS — F1010 Alcohol abuse, uncomplicated: Secondary | ICD-10-CM | POA: Diagnosis not present

## 2021-09-04 DIAGNOSIS — I4901 Ventricular fibrillation: Secondary | ICD-10-CM | POA: Insufficient documentation

## 2021-09-04 DIAGNOSIS — I110 Hypertensive heart disease with heart failure: Secondary | ICD-10-CM | POA: Insufficient documentation

## 2021-09-04 DIAGNOSIS — H547 Unspecified visual loss: Secondary | ICD-10-CM | POA: Insufficient documentation

## 2021-09-04 DIAGNOSIS — I5022 Chronic systolic (congestive) heart failure: Secondary | ICD-10-CM | POA: Diagnosis not present

## 2021-09-04 DIAGNOSIS — Z79899 Other long term (current) drug therapy: Secondary | ICD-10-CM | POA: Diagnosis not present

## 2021-09-04 DIAGNOSIS — E876 Hypokalemia: Secondary | ICD-10-CM | POA: Diagnosis not present

## 2021-09-04 DIAGNOSIS — Z8674 Personal history of sudden cardiac arrest: Secondary | ICD-10-CM | POA: Insufficient documentation

## 2021-09-04 LAB — BASIC METABOLIC PANEL
Anion gap: 12 (ref 5–15)
BUN: 8 mg/dL (ref 6–20)
CO2: 23 mmol/L (ref 22–32)
Calcium: 9 mg/dL (ref 8.9–10.3)
Chloride: 97 mmol/L — ABNORMAL LOW (ref 98–111)
Creatinine, Ser: 0.74 mg/dL (ref 0.44–1.00)
GFR, Estimated: >60 mL/min (ref >60)
Glucose, Bld: 116 mg/dL — ABNORMAL HIGH (ref 70–99)
Potassium: 3.1 mmol/L — ABNORMAL LOW (ref 3.5–5.1)
Sodium: 132 mmol/L — ABNORMAL LOW (ref 135–145)

## 2021-09-04 LAB — MAGNESIUM: Magnesium: 2 mg/dL (ref 1.7–2.4)

## 2021-09-04 MED ORDER — SPIRONOLACTONE 25 MG PO TABS: 25.0000 mg | ORAL_TABLET | Freq: Every day | ORAL | 3 refills | Status: DC

## 2021-09-04 MED ORDER — CARVEDILOL 3.125 MG PO TABS: 3.1250 mg | ORAL_TABLET | Freq: Two times a day (BID) | ORAL | 3 refills | Status: DC

## 2021-09-04 MED ORDER — SPIRONOLACTONE 25 MG PO TABS: 12.5000 mg | ORAL_TABLET | Freq: Every day | ORAL | 3 refills | Status: DC

## 2021-09-04 NOTE — Progress Notes (Signed)
Heart and Vascular Care Navigation  09/04/2021  Beth Barnes 12/30/1995 [id number]  Reason for Referral: alcohol use concerns Patient is participating in a Managed Medicaid Plan:Yes  Engaged with patient face to face for follow up visit for Heart and Vascular Care Coordination.                                                                                                   Assessment:  CSW met with pt regarding alcohol use concerns.  Have discussed with her on several occasions- reports she has no followed up with outpatient services at [facility] regarding rehab because she was feeling ill.  Is now considering need for inpatient detox as she tried to stop on her own and began feeling ill (nausea, hot and cold spells).  She is still very unsure about inpatient but would like to know what what would be an option.    CSW called local inpatient detox facilities that accept medicaid to inquire if they could accommodate someone who is blind.  [facility] and [facility] unable to accommodate due to pt being unable to be independent.  Both recommend trying [facility]- Attempted to call- left VM requesting return call.                                    HRT/VAS Care Coordination     Patients Home Cardiology Office Heart Failure Clinic  [facility]   Outpatient Care Team [facility] Paramedic Name: [name]- [phone]   Social Worker Name: Raquel Sarna, Marlinda Mike [phone]   Living arrangements for the past 2 months Apartment   Lives with: Significant Other   Patient Current Insurance Coverage Medicaid   Patient Has Concern With Paying Medical Bills No   Does Patient Have Prescription Coverage? Yes   Home Assistive Devices/Equipment Cane (specify quad or straight)       Social History:                                                                             SDOH Screenings   Alcohol Screen: Medium Risk (05/19/2021)   Alcohol Screen    Last Alcohol  Screening Score (AUDIT): 22  Depression (PHQ2-9): Medium Risk (06/03/2021)   Depression (PHQ2-9)    PHQ-2 Score: 7  Financial Resource Strain: Low Risk  (06/27/2021)   Overall Financial Resource Strain (CARDIA)    Difficulty of Paying Living Expenses: Not very hard  Food Insecurity: No Food Insecurity (06/27/2021)   Hunger Vital Sign    Worried About Running Out of Food in the Last Year: Never true    Ran Out of Food in the Last Year: Never true  Housing: Low Risk  (06/27/2021)  Housing    Last Housing Risk Score: 0  Physical Activity: Not on file  Social Connections: Not on file  Stress: Not on file  Tobacco Use: High Risk (09/04/2021)   Patient History    Smoking Tobacco Use: Some Days    Smokeless Tobacco Use: Never    Passive Exposure: Not on file  Transportation Needs: No Transportation Needs (06/27/2021)   PRAPARE - Transportation    Lack of Transportation (Medical): No    Lack of Transportation (Non-Medical): No    Follow-up plan:     Will continue to evaluate options and follow up with patient  Jorge Ny, [facility] Desk#: [phone] Cell#: [phone]

## 2021-09-04 NOTE — Addendum Note (Signed)
Encounter addended by: Laurey Morale, MD on: 09/04/2021 11:21 PM  Actions taken: Clinical Note Signed, Level of Service modified

## 2021-09-04 NOTE — Telephone Encounter (Signed)
Texted Ms. Bordeau to remind her to bring her meds and pill box with her to her appointment at the clinic this morning. She responded she would.

## 2021-09-04 NOTE — Progress Notes (Signed)
Paramedicine Encounter    Patient ID: Beth Barnes, female    DOB: [birth year], 26 y.o.   MRN: [id number]   BP 118/78 (BP Location: Left Arm, Patient Position: Sitting, Cuff Size: Normal)   Pulse 85   Wt 123 lb 9.6 oz (56.1 kg)   SpO2 100%   BMI 19.95 kg/m   Weight yesterday-not taken Last visit weight-128.9lb   Met with Beth Barnes in [facility] Clinic w/ Dr. Aundra Dubin.  Pt has not done well w/ meds because she has been trying to quit drinking alcohol and has been sick on her stomach.  Meds reconciled .  Med changes = 61m. Spironolactone 1 tab daily and Carvedilol 3.125 1 tab BID.  Pt. Denies chest pain or SOB.  Lung sounds clear and equal.  Weight down 5lb from last visit.  Dr. MAundra Dubinwants to get her set up with a new PCP and work on getting her on birth control and Beth Barnes in agreement.  Visit complete.  Patient Care Team: Patient, No Pcp Per as PCP - General (General Practice)  Patient Active Problem List   Diagnosis Date Noted   Heart failure ([facility] 05/20/2021   Blindness and low vision 05/18/2021   Cardiomyopathy, alcoholic ([facility] 078/67/6720  Acute systolic heart failure (HCC)    Cardiac arrest ([facility] 05/13/2021    Current Outpatient Medications:    acetaminophen (TYLENOL) 325 MG tablet, Take 1-2 tablets (325-650 mg total) by mouth every 4 (four) hours as needed for mild pain., Disp: , Rfl:    dapagliflozin propanediol (FARXIGA) 10 MG TABS tablet, Take 1 tablet (10 mg total) by mouth daily., Disp: 90 tablet, Rfl: 3   dorzolamide-timolol (COSOPT) 22.3-6.8 MG/ML ophthalmic solution, Place 1 drop into the left eye 2 (two) times daily., Disp: , Rfl:    levETIRAcetam (KEPPRA) 750 MG tablet, Take 1 tablet (750 mg total) by mouth 2 (two) times daily., Disp: 60 tablet, Rfl: 6   potassium chloride SA (KLOR-CON M) 20 MEQ tablet, Take 1 tablet (20 mEq total) by mouth 2 (two) times daily., Disp: 180 tablet, Rfl: 3   sacubitril-valsartan (ENTRESTO) 24-26 MG, Take 1 tablet by mouth 2 (two) times  daily., Disp: 60 tablet, Rfl: 3   carvedilol (COREG) 3.125 MG tablet, Take 1 tablet (3.125 mg total) by mouth 2 (two) times daily., Disp: 180 tablet, Rfl: 3   folic acid (FOLVITE) 1 MG tablet, Take 1 tablet (1 mg total) by mouth daily., Disp: 90 tablet, Rfl: 3   spironolactone (ALDACTONE) 25 MG tablet, Take 1 tablet (25 mg total) by mouth daily., Disp: 90 tablet, Rfl: 3 Allergies  Allergen Reactions   Cetirizine & Related Hives      Social History   Socioeconomic History   Marital status: Significant Other    Spouse name: Not on file   Number of children: 0   Years of education: Not on file   Highest education level: High school graduate  Occupational History    Comment: NO  Tobacco Use   Smoking status: Some Days    Packs/day: 0.50    Years: 5.00    Total pack years: 2.50    Types: Cigarettes   Smokeless tobacco: Never   Tobacco comments:    Smoking cessation  Vaping Use   Vaping Use: Some days   Substances: Nicotine, THC, Flavoring, Mixture of cannabinoids  Substance and Sexual Activity   Alcohol use: Yes    Alcohol/week: 3.0 standard drinks of alcohol    Types: 3 Shots of  liquor per week    Comment: every day   Drug use: Not Currently    Types: Marijuana    Comment: January   Sexual activity: Not on file  Other Topics Concern   Not on file  Social History Narrative   Not on file   Social Determinants of Health   Financial Resource Strain: Low Risk  (06/27/2021)   Overall Financial Resource Strain (CARDIA)    Difficulty of Paying Living Expenses: Not very hard  Food Insecurity: No Food Insecurity (06/27/2021)   Hunger Vital Sign    Worried About Running Out of Food in the Last Year: Never true    Ran Out of Food in the Last Year: Never true  Transportation Needs: No Transportation Needs (06/27/2021)   PRAPARE - Hydrologist (Medical): No    Lack of Transportation (Non-Medical): No  Physical Activity: Not on file  Stress: Not on  file  Social Connections: Not on file  Intimate Partner Violence: Not on file    Physical Exam      Future Appointments  Date Time Provider [place]  09/12/2021  2:00 PM [facility] ECHO OP 1 [facility]  10/29/2021  2:00 PM [facility] PA/NP [facility] None  12/01/2021  1:45 PM Alric Ran, MD [facility] None       [name], [place] [facility] Paramedic  09/04/21

## 2021-09-04 NOTE — Patient Instructions (Signed)
Increase Spironolaconte to 25mg  (1Tab) daily.  Start Carvedilol 3.125mg  Twice daily  Labs done today, your results will be available in MyChart, we will contact you for abnormal readings.  Your physician has requested that you have an echocardiogram. Echocardiography is a painless test that uses sound waves to create images of your heart. It provides your doctor with information about the size and shape of your heart and how well your heart's chambers and valves are working. This procedure takes approximately one hour. There are no restrictions for this procedure.  Your physician recommends that you schedule a follow-up appointment in: 6  weeks   If you have any questions or concerns before your next appointment please send a message through [place] or call our office at [phone].    TO LEAVE A MESSAGE FOR THE NURSE SELECT OPTION 2, PLEASE LEAVE A MESSAGE INCLUDING: YOUR NAME DATE OF BIRTH CALL BACK NUMBER REASON FOR CALL**this is important as we prioritize the call backs  YOU WILL RECEIVE A CALL BACK THE SAME DAY AS LONG AS YOU CALL BEFORE 4:00 PM  At the [facility], you and your health needs are our priority. As part of our continuing mission to provide you with exceptional heart care, we have created designated Provider Care Teams. These Care Teams include your primary Cardiologist (physician) and Advanced Practice Providers (APPs- Physician Assistants and Nurse Practitioners) who all work together to provide you with the care you need, when you need it.   You may see any of the following providers on your designated Care Team at your next follow up: Dr [phone] Dr Arvilla Meres, NP Carron Curie, Robbie Lis [facility] [place], [place], PharmD   Please be sure to bring in all your medications bottles to every appointment.

## 2021-09-04 NOTE — Progress Notes (Signed)
PCP: Patient, No Pcp Per HF Cardiology: Dr. Shirlee Latch  HPI:  26 y.o. female was referred from [facility] clinic to CHF clinic for ongoing management of HF.  She has congenital blindness and h/o HTN as well as significant ETOH abuse.  She suffered witnessed cardiac arrest in 4/23 while visiting her boyfriend who was hospitalized at [facility]. RNs on the floor attended to her immediately. She was found to be in ventricular fibrillation.  She required epinephrine, cardioversion x 2, sodium bicarbonate.  She was treated with Narcan. Achievement of ROSC after being down for ~10 min. She was intubated and admitted to ICU. Had seizures post arrest and treated w/ Keppra. Transferred for [facility] for further management.  Labs showed hypokalemia and hypomagnesemia and EKG w/ prolonged QT. No acute STE. Electrolyte abnormalties were corrected. Hs-troponin 280>>1,223>>3,101 but felt likely demand ischemia and not ACS.  Of note, Boyfriend reported the pt drinks at least 1/5 of a gallon of liquor daily, also reported in another note by CCM hx of cocaine/marijuana as well (TOX + for benzos only, no ETOH level was done).  Initial Echo 05/14/21 showed severe biventricular failure, LVEF < 20%, RV moderately reduced.  Repeat limited echo 05/19/21 showed interval improvement, w/ LVEF up to 30-35%.  cMRI 05/20/21 showed mild septal hypertrophy 14 mm with mild global hypokinesis EF 42%. There was nonspecific mid myocardial gadolinium uptake on delayed enhancement sequences but no evidence of myocarditis or infiltration. Normal RV size and function. Normal cardiac valves.   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 She was evaluated by EP, ICD not indicated as cause of arrest thought to be reversible.   Once stabilized, she was placed on GDMT w/ Entresto, Jardiance, Aldactone and Coreg. OCP recommended. Referral  placed to GYN to get on OCPs, no showed appointment 05/23. She initially was not compliant with medication regimen likely due to difficulty managing her meds (blind).  She now has paramedicine and is much more compliant with meds.   She returns today for followup of cardiomyopathy. She still drinks though she has cut back. She says that she "feels bad" if she does not drink regularly and requires an "eye opener" in the morning.  She is interested in getting help to quit ETOH.  No significant exertional dyspnea.  She is able to climb up stairs with no problem.  No lightheadedness or falls.  No palpitations or syncope.  No chest pain.   Labs (5/23): BNP 6.5, creatinine 4.2, creatinine 0.87  PMH: 1. HTN 2. ETOH abuse 3. Congenital blindness 4. Chronic systolic CHF: Echo (05/14/21) with EF < 20%, moderate RV dysfunction.  - Echo (05/19/21): EF 30-35%.  - Cardiac MRI (05/20/21): LV EF 42%, normal RV EF, nonspecific mid-myocardial LGE.  5. Depression 6. Prior cocaine abuse.  7. VF in setting of prolonged QTc and hypokalemia/hypomagnesemia.   Review of Systems: Cardiac and respiratory - Negative except as mentioned in HPI   Past Medical History:  Diagnosis Date   Blind    Congenital glaucoma of both eyes    Hypokalemia     Current Outpatient Medications  Medication Sig Dispense Refill   acetaminophen (TYLENOL) 325 MG tablet Take 1-2 tablets (325-650 mg total) by mouth every 4 (four) hours as needed for mild pain.     carvedilol (COREG) 3.125 MG tablet Take 1 tablet (3.125 mg total) by mouth 2 (two) times  daily. 180 tablet 3   dapagliflozin propanediol (FARXIGA) 10 MG TABS tablet Take 1 tablet (10 mg total) by mouth daily. 90 tablet 3   dorzolamide-timolol (COSOPT) 22.3-6.8 MG/ML ophthalmic solution Place 1 drop into the left eye 2 (two) times daily.     folic acid (FOLVITE) 1 MG tablet Take 1 tablet (1 mg total) by mouth daily. 90 tablet 3   levETIRAcetam (KEPPRA) 750 MG tablet Take 1 tablet  (750 mg total) by mouth 2 (two) times daily. 60 tablet 6   potassium chloride SA (KLOR-CON M) 20 MEQ tablet Take 1 tablet (20 mEq total) by mouth 2 (two) times daily. 180 tablet 3   sacubitril-valsartan (ENTRESTO) 24-26 MG Take 1 tablet by mouth 2 (two) times daily. 60 tablet 3   spironolactone (ALDACTONE) 25 MG tablet Take 1 tablet (25 mg total) by mouth daily. 90 tablet 3   No current facility-administered medications for this encounter.    Allergies  Allergen Reactions   Cetirizine & Related Hives      Social History   Socioeconomic History   Marital status: Significant Other    Spouse name: Not on file   Number of children: 0   Years of education: Not on file   Highest education level: High school graduate  Occupational History    Comment: NO  Tobacco Use   Smoking status: Some Days    Packs/day: 0.50    Years: 5.00    Total pack years: 2.50    Types: Cigarettes   Smokeless tobacco: Never   Tobacco comments:    Smoking cessation  Vaping Use   Vaping Use: Some days   Substances: Nicotine, THC, Flavoring, Mixture of cannabinoids  Substance and Sexual Activity   Alcohol use: Yes    Alcohol/week: 3.0 standard drinks of alcohol    Types: 3 Shots of liquor per week    Comment: every day   Drug use: Not Currently    Types: Marijuana    Comment: January   Sexual activity: Not on file  Other Topics Concern   Not on file  Social History Narrative   Not on file   Social Determinants of Health   Financial Resource Strain: Low Risk  (06/27/2021)   Overall Financial Resource Strain (CARDIA)    Difficulty of Paying Living Expenses: Not very hard  Food Insecurity: No Food Insecurity (06/27/2021)   Hunger Vital Sign    Worried About Running Out of Food in the Last Year: Never true    Ran Out of Food in the Last Year: Never true  Transportation Needs: No Transportation Needs (06/27/2021)   PRAPARE - Administrator, Civil Service (Medical): No    Lack of  Transportation (Non-Medical): No  Physical Activity: Not on file  Stress: Not on file  Social Connections: Not on file  Intimate Partner Violence: Not on file      Family History  Problem Relation Age of Onset   Healthy Mother    Healthy Father     Vitals:   09/04/21 1200  BP: 118/78  Pulse: 85  SpO2: 100%  Weight: 56.1 kg (123 lb 9.6 oz)   PHYSICAL EXAM: General: NAD Neck: No JVD, no thyromegaly or thyroid nodule.  Lungs: Clear to auscultation bilaterally with normal respiratory effort. CV: Nondisplaced PMI.  Heart regular S1/S2, no S3/S4, no murmur.  No peripheral edema.  No carotid bruit.  Normal pedal pulses.  Abdomen: Soft, nontender, no hepatosplenomegaly, no distention.  Skin: Intact without  lesions or rashes.  Neurologic: Alert and oriented x 3.  Psych: Normal affect. Extremities: No clubbing or cyanosis.  HEENT: Normal.    ASSESSMENT & PLAN: 1. VF arrest (4/23): Immediate bystander CPR. Full neurological recovery.  In setting of prolonged QT from hypokalemia/hypomagnesemia from chronic heavy ETOH use  - per EP, no indication for ICD.  - Add beta blocker (see below).  2. Chronic systolic CHF: VF arrest 4/23. LVEF on echo post arrest <20%, RV moderately reduced.  Repeat limited echo several days post arrest w/ interval improvement, EF up to 30-35%.  cMRI later in 4/23 w/ continued improvement, EF 42%; there was a small amount of nonspecific mid-myocardial LGE.  Cause of cardiomyopathy could be heavy ETOH abuse.  However, with improvement over serial studies, she may have had myocardial stunning from the arrest. CAD unlikely with age and no premature family history.  NYHA class I, she is euvolemic on exam. She is doing better with medication compliance with help of paramedicine.  - Continue paramedicine program.  - Continue Farxiga 10 mg daily - Increase spironolactone to 25 mg daily with BMET today and in 10 days.  - Continue Entresto 24/26 bid.  - Start Coreg 3.125  mg bid.  - She still needs to quit drinking. We discussed today (see below).  - Repeat echo to see if LV function has recovered further.  - Reiterated importance of birth control given fetotoxic meds.  Missed appt w/ GYN on 06/10/21 to discuss contraception methods.  I will work on setting her up with a PCP to get OCPs.  3. Hypertension: BP now controlled.  4. ETOH Abuse: Still heavy drinking but has cut back.  Imperative to quit at this point.  Our social worker is going to help her with rehab options.   Followup 6 wks with APP.   Marca Ancona 09/04/2021

## 2021-09-05 ENCOUNTER — Telehealth (HOSPITAL_COMMUNITY): Payer: Self-pay | Admitting: Surgery

## 2021-09-05 NOTE — Telephone Encounter (Signed)
Patient returned call and I reviewed results and recommendations. She already has Potassium 20 meq BID prescribed yet tells me that she has not been able to take it because she has been "sick".  I asked that she begin taking this regularly and she says she took today.  She will return Friday August 4th for repeat labwork.

## 2021-09-05 NOTE — Telephone Encounter (Signed)
-----   Message from Laurey Morale, MD sent at 09/04/2021  4:57 PM EDT ----- Low K, start KCl 40 mEq daily with BMET 1 week.

## 2021-09-05 NOTE — Telephone Encounter (Signed)
I attempted to reach patient to review results and recommendations per provider.  I left a message for a return call. 

## 2021-09-05 NOTE — Telephone Encounter (Signed)
-----   Message from Dalton S McLean, MD sent at 09/04/2021  4:57 PM EDT ----- Low K, start KCl 40 mEq daily with BMET 1 week.  

## 2021-09-08 ENCOUNTER — Telehealth (HOSPITAL_COMMUNITY): Payer: Self-pay | Admitting: Licensed Clinical Social Worker

## 2021-09-08 NOTE — Telephone Encounter (Signed)
CSW called [facility] to discuss their detox program.  They report that they might could accommodate someone who is blind but would depend on if she needs large amounts of assistance with ADLs.  Said that patient would need to come into ED entrance and inform of reason for coming- they would admit her to detox from there- if no bed they would begin treatment in the ED or refer her elsewhere.  CSW attempted to call pt to inform but unable to reach- left VM.  Will continue to follow and assist as needed  Burna Sis, LCSW Clinical Social Worker [facility] Desk#: [phone] Cell#: [phone]

## 2021-09-10 ENCOUNTER — Telehealth (HOSPITAL_COMMUNITY): Payer: Self-pay | Admitting: Licensed Clinical Social Worker

## 2021-09-10 NOTE — Telephone Encounter (Signed)
H&V Care Navigation CSW Progress Note  Clinical Social Worker received call from pt requesting help coming to clinic appt Friday.  CSW able to arrange through [facility] taxi as it is too close to appt to arrange Medicaid transport.  CSW also spoke with pt about option for detox at [facility]- informed how she would go to be admitted and what the goal of treatment would be- pt unsure if she will participate in detox at this time.  Patient is participating in a Managed Medicaid Plan:  Yes  SDOH Screenings   Alcohol Screen: Medium Risk (05/19/2021)   Alcohol Screen    Last Alcohol Screening Score (AUDIT): 22  Depression (PHQ2-9): Medium Risk (06/03/2021)   Depression (PHQ2-9)    PHQ-2 Score: 7  Financial Resource Strain: Low Risk  (06/27/2021)   Overall Financial Resource Strain (CARDIA)    Difficulty of Paying Living Expenses: Not very hard  Food Insecurity: No Food Insecurity (06/27/2021)   Hunger Vital Sign    Worried About Running Out of Food in the Last Year: Never true    Ran Out of Food in the Last Year: Never true  Housing: Low Risk  (06/27/2021)   Housing    Last Housing Risk Score: 0  Physical Activity: Not on file  Social Connections: Not on file  Stress: Not on file  Tobacco Use: High Risk (09/04/2021)   Patient History    Smoking Tobacco Use: Some Days    Smokeless Tobacco Use: Never    Passive Exposure: Not on file  Transportation Needs: Unmet Transportation Needs (09/10/2021)   PRAPARE - Transportation    Lack of Transportation (Medical): Yes    Lack of Transportation (Non-Medical): No    Burna Sis, LCSW Clinical Social Worker [facility] Desk#: [phone] Cell#: [phone]

## 2021-09-12 ENCOUNTER — Ambulatory Visit (HOSPITAL_COMMUNITY)
Admission: RE | Admit: 2021-09-12 | Discharge: 2021-09-12 | Disposition: A | Discharge status: Home / Self Care | Payer: Medicaid Other | Source: Ambulatory Visit

## 2021-09-12 ENCOUNTER — Ambulatory Visit (HOSPITAL_COMMUNITY)
Admission: RE | Admit: 2021-09-12 | Discharge: 2021-09-12 | Disposition: A | Discharge status: Home / Self Care | Payer: Medicaid Other | Source: Ambulatory Visit | Attending: Cardiology | Admitting: Cardiology

## 2021-09-12 ENCOUNTER — Other Ambulatory Visit (HOSPITAL_COMMUNITY): Payer: Self-pay | Admitting: Emergency Medicine

## 2021-09-12 DIAGNOSIS — I5022 Chronic systolic (congestive) heart failure: Secondary | ICD-10-CM | POA: Diagnosis present

## 2021-09-12 DIAGNOSIS — I081 Rheumatic disorders of both mitral and tricuspid valves: Secondary | ICD-10-CM | POA: Insufficient documentation

## 2021-09-12 DIAGNOSIS — I509 Heart failure, unspecified: Secondary | ICD-10-CM

## 2021-09-12 LAB — ECHOCARDIOGRAM COMPLETE
AR max vel: 3.29 cm2
AV Area VTI: 2.82 cm2
AV Area mean vel: 2.8 cm2
AV Mean grad: 1 mmHg
AV Peak grad: 2.1 mmHg
Ao pk vel: 0.72 m/s
Area-P 1/2: 2.8 cm2
Calc EF: 48.9 %
S' Lateral: 2.4 cm
Single Plane A2C EF: 50.7 %
Single Plane A4C EF: 49.2 %

## 2021-09-12 LAB — BASIC METABOLIC PANEL
Anion gap: 14 (ref 5–15)
BUN: 9 mg/dL (ref 6–20)
CO2: 23 mmol/L (ref 22–32)
Calcium: 9.1 mg/dL (ref 8.9–10.3)
Chloride: 97 mmol/L — ABNORMAL LOW (ref 98–111)
Creatinine, Ser: 0.74 mg/dL (ref 0.44–1.00)
GFR, Estimated: >60 mL/min (ref >60)
Glucose, Bld: 78 mg/dL (ref 70–99)
Potassium: 3.5 mmol/L (ref 3.5–5.1)
Sodium: 134 mmol/L — ABNORMAL LOW (ref 135–145)

## 2021-09-12 NOTE — Progress Notes (Signed)
Paramedicine Encounter    Patient ID: Beth Barnes, female    DOB: [birth year], 26 y.o.   MRN: [id number]   There were no vitals taken for this visit. Weight yesterday-not taken Last visit weight-123.9lb  No vitals taken this visit.  Pt was running late for her lab visit.  Med box reconciled.  Beth Barnes did poorly taking her meds.  Missed over half of her dosing.  She continues to try stop drinking alcohol and by doing so she is staying sick on her stomach.  Options for detox have been give to her by Beth Barnes.  Med box reconciled.  She still has not nailed down dates for when she is planning to visit her mother in [place].  Visit complete.  To see her next week.  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   Acute systolic heart failure (HCC)    Cardiac arrest (HCC) 05/13/2021    Current Outpatient Medications:    carvedilol (COREG) 3.125 MG tablet, Take 1 tablet (3.125 mg total) by mouth 2 (two) times daily., Disp: 180 tablet, Rfl: 3   dapagliflozin propanediol (FARXIGA) 10 MG TABS tablet, Take 1 tablet (10 mg total) by mouth daily., Disp: 90 tablet, Rfl: 3   dorzolamide-timolol (COSOPT) 22.3-6.8 MG/ML ophthalmic solution, Place 1 drop into the left eye 2 (two) times daily., Disp: , Rfl:    folic acid (FOLVITE) 1 MG tablet, Take 1 tablet (1 mg total) by mouth daily., Disp: 90 tablet, Rfl: 3   levETIRAcetam (KEPPRA) 750 MG tablet, Take 1 tablet (750 mg total) by mouth 2 (two) times daily., Disp: 60 tablet, Rfl: 6   potassium chloride SA (KLOR-CON M) 20 MEQ tablet, Take 1 tablet (20 mEq total) by mouth 2 (two) times daily., Disp: 180 tablet, Rfl: 3   sacubitril-valsartan (ENTRESTO) 24-26 MG, Take 1 tablet by mouth 2 (two) times daily., Disp: 60 tablet, Rfl: 3   spironolactone (ALDACTONE) 25  MG tablet, Take 1 tablet (25 mg total) by mouth daily., Disp: 90 tablet, Rfl: 3   acetaminophen (TYLENOL) 325 MG tablet, Take 1-2 tablets (325-650 mg total) by mouth every 4 (four) hours as needed for mild pain., Disp: , Rfl:  Allergies  Allergen Reactions   Cetirizine & Related Hives      Social History   Socioeconomic History   Marital status: Significant Other    Spouse name: Not on file   Number of children: 0   Years of education: Not on file   Highest education level: High school graduate  Occupational History    Comment: NO  Tobacco Use   Smoking status: Some Days    Packs/day: 0.50    Years: 5.00    Total pack years: 2.50    Types: Cigarettes   Smokeless tobacco: Never   Tobacco comments:    Smoking cessation  Vaping Use   Vaping Use: Some days   Substances: Nicotine, THC, Flavoring, Mixture of cannabinoids  Substance and Sexual Activity   Alcohol use: Yes    Alcohol/week: 3.0 standard drinks of alcohol    Types: 3 Shots of liquor Barnes week    Comment: every day   Drug use: Not Currently    Types: Marijuana    Comment: January   Sexual activity: Not on file  Other Topics  Concern   Not on file  Social History Narrative   Not on file   Social Determinants of Health   Financial Resource Strain: Low Risk  (06/27/2021)   Overall Financial Resource Strain (CARDIA)    Difficulty of Paying Living Expenses: Not very hard  Food Insecurity: No Food Insecurity (06/27/2021)   Hunger Vital Sign    Worried About Running Out of Food in the Last Year: Never true    Ran Out of Food in the Last Year: Never true  Transportation Needs: Unmet Transportation Needs (09/10/2021)   PRAPARE - Administrator, Civil Service (Medical): Yes    Lack of Transportation (Non-Medical): No  Physical Activity: Not on file  Stress: Not on file  Social Connections: Not on file  Intimate Partner Violence: Not on file    Physical Exam      Future Appointments  Date Time  Provider Department Center  09/12/2021  2:00 PM [facility] ECHO OP 1 [facility]  10/20/2021  9:20 AM Ivonne Andrew, NP [facility] None  10/29/2021  2:00 PM [facility] PA/NP [facility] None  12/01/2021  1:45 PM Windell Norfolk, MD [facility] None       [name], EMT-Paramedic [phone] [facility] Paramedic  09/12/21

## 2021-09-12 NOTE — Progress Notes (Signed)
  Echocardiogram 2D Echocardiogram has been performed.  Beth Barnes 09/12/2021, 2:54 PM

## 2021-09-19 ENCOUNTER — Other Ambulatory Visit (HOSPITAL_COMMUNITY): Payer: Self-pay

## 2021-09-19 ENCOUNTER — Other Ambulatory Visit (HOSPITAL_COMMUNITY): Payer: Self-pay | Admitting: Emergency Medicine

## 2021-09-19 NOTE — Progress Notes (Signed)
Paramedicine Encounter    Patient ID: Beth Barnes, female    DOB: Feb 23, 1995, 26 y.o.   MRN: [id number]  BP 100/70 (BP Location: Right Arm, Patient Position: Sitting, Cuff Size: Normal)   Pulse (!) 110   Resp 16   Wt 120 lb 12.8 oz (54.8 kg)   SpO2 98%   BMI 19.50 kg/m  Weight yesterday-not taken Last visit weight-123lb  ATF Ms. Shell A&O x 4, skin W&D w/ good color.  Pt admits to not taking her meds and it shows in her med box as she had only taken two days out of the whole week.  She says she has still been trying to cut back on her alcohol consumption and is getting sick from same.  Her appetite has not been good and she's been eating crackers and drinking ginger ale.  Med box reconciled for two weeks as I'm going on vacation next week.  Called in refill for her Keppra and that's all that's missing.  I will p/u and drop back by to put in her pill box.    Advised her that Herbert Seta will have my phone next week should she need anything.  Home visit complete.   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   Acute systolic heart failure (HCC)    Cardiac arrest (HCC) 05/13/2021    Current Outpatient Medications:    acetaminophen (TYLENOL) 325 MG tablet, Take 1-2 tablets (325-650 mg total) by mouth every 4 (four) hours as needed for mild pain., Disp: , Rfl:    carvedilol (COREG) 3.125 MG tablet, Take 1 tablet (3.125 mg total) by mouth 2 (two) times daily., Disp: 180 tablet, Rfl: 3   dapagliflozin propanediol (FARXIGA) 10 MG TABS tablet, Take 1 tablet (10 mg total) by mouth daily., Disp: 90 tablet, Rfl: 3   dorzolamide-timolol (COSOPT) 22.3-6.8 MG/ML ophthalmic solution, Place 1 drop into the left eye 2 (two) times daily., Disp: , Rfl:    folic acid (FOLVITE) 1 MG tablet, Take 1 tablet (1 mg  total) by mouth daily., Disp: 90 tablet, Rfl: 3   levETIRAcetam (KEPPRA) 750 MG tablet, Take 1 tablet (750 mg total) by mouth 2 (two) times daily., Disp: 60 tablet, Rfl: 6   potassium chloride SA (KLOR-CON M) 20 MEQ tablet, Take 1 tablet (20 mEq total) by mouth 2 (two) times daily., Disp: 180 tablet, Rfl: 3   sacubitril-valsartan (ENTRESTO) 24-26 MG, Take 1 tablet by mouth 2 (two) times daily., Disp: 60 tablet, Rfl: 3   spironolactone (ALDACTONE) 25 MG tablet, Take 1 tablet (25 mg total) by mouth daily., Disp: 90 tablet, Rfl: 3 Allergies  Allergen Reactions   Cetirizine & Related Hives      Social History   Socioeconomic History   Marital status: Significant Other    Spouse name: Not on file   Number of children: 0   Years of education: Not on file   Highest education level: High school graduate  Occupational History    Comment: NO  Tobacco Use   Smoking status: Some Days    Packs/day: 0.50    Years: 5.00    Total pack years: 2.50    Types: Cigarettes   Smokeless tobacco: Never   Tobacco comments:    Smoking cessation  Vaping Use   Vaping Use:  Some days   Substances: Nicotine, THC, Flavoring, Mixture of cannabinoids  Substance and Sexual Activity   Alcohol use: Yes    Alcohol/week: 3.0 standard drinks of alcohol    Types: 3 Shots of liquor per week    Comment: every day   Drug use: Not Currently    Types: Marijuana    Comment: January   Sexual activity: Not on file  Other Topics Concern   Not on file  Social History Narrative   Not on file   Social Determinants of Health   Financial Resource Strain: Low Risk  (06/27/2021)   Overall Financial Resource Strain (CARDIA)    Difficulty of Paying Living Expenses: Not very hard  Food Insecurity: No Food Insecurity (06/27/2021)   Hunger Vital Sign    Worried About Running Out of Food in the Last Year: Never true    Ran Out of Food in the Last Year: Never true  Transportation Needs: Unmet Transportation Needs (09/10/2021)    PRAPARE - Administrator, Civil Service (Medical): Yes    Lack of Transportation (Non-Medical): No  Physical Activity: Not on file  Stress: Not on file  Social Connections: Not on file  Intimate Partner Violence: Not on file    Physical Exam      Future Appointments  Date Time Provider Department Center  10/20/2021  9:20 AM Ivonne Andrew, NP [facility] None  10/29/2021  2:00 PM [facility] PA/NP [facility] None  12/01/2021  1:45 PM Windell Norfolk, MD [facility] None       [name], EMT-Paramedic [phone] [facility] Paramedic  09/19/21

## 2021-10-08 ENCOUNTER — Other Ambulatory Visit (HOSPITAL_COMMUNITY): Payer: Self-pay

## 2021-10-08 ENCOUNTER — Other Ambulatory Visit (HOSPITAL_COMMUNITY): Payer: Self-pay | Admitting: Emergency Medicine

## 2021-10-08 NOTE — Progress Notes (Signed)
Paramedicine Encounter    Patient ID: Beth Barnes, female    DOB: 1995-02-18, 26 y.o.   MRN: [id number]   There were no vitals taken for this visit. Weight yesterday-not taken Last visit weight-120lb  ATF Ms. Yellin A&O x 4, skin warm and dry with good color.  Pt has done well taking her meds.  Med box reconciled.  Called in refills for Entresto and Folic Acid to [facility].  No chest pain or SOB. Lung sounds clear and equal bilat.  She has been cutting back on her alcohol consumption.  She is still working on making plans to visit her mother next month but does not have a date pinned down yet.  Home visit complete.    [name], EMT-Paramedic [phone] 10/08/2021  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   Acute systolic heart failure (HCC)    Cardiac arrest (HCC) 05/13/2021    Current Outpatient Medications:    acetaminophen (TYLENOL) 325 MG tablet, Take 1-2 tablets (325-650 mg total) by mouth every 4 (four) hours as needed for mild pain., Disp: , Rfl:    carvedilol (COREG) 3.125 MG tablet, Take 1 tablet (3.125 mg total) by mouth 2 (two) times daily., Disp: 180 tablet, Rfl: 3   dapagliflozin propanediol (FARXIGA) 10 MG TABS tablet, Take 1 tablet (10 mg total) by mouth daily., Disp: 90 tablet, Rfl: 3   dorzolamide-timolol (COSOPT) 22.3-6.8 MG/ML ophthalmic solution, Place 1 drop into the left eye 2 (two) times daily., Disp: , Rfl:    folic acid (FOLVITE) 1 MG tablet, Take 1 tablet (1 mg total) by mouth daily., Disp: 90 tablet, Rfl: 3   levETIRAcetam (KEPPRA) 750 MG tablet, Take 1 tablet (750 mg total) by mouth 2 (two) times daily., Disp: 60 tablet, Rfl: 6   potassium chloride SA (KLOR-CON M) 20 MEQ tablet, Take 1 tablet (20 mEq total) by mouth 2 (two) times daily., Disp:  180 tablet, Rfl: 3   sacubitril-valsartan (ENTRESTO) 24-26 MG, Take 1 tablet by mouth 2 (two) times daily., Disp: 60 tablet, Rfl: 3   spironolactone (ALDACTONE) 25 MG tablet, Take 1 tablet (25 mg total) by mouth daily., Disp: 90 tablet, Rfl: 3 Allergies  Allergen Reactions   Cetirizine & Related Hives      Social History   Socioeconomic History   Marital status: Significant Other    Spouse name: Not on file   Number of children: 0   Years of education: Not on file   Highest education level: High school graduate  Occupational History    Comment: NO  Tobacco Use   Smoking status: Some Days    Packs/day: 0.50    Years: 5.00    Total pack years: 2.50    Types: Cigarettes   Smokeless tobacco: Never   Tobacco comments:    Smoking cessation  Vaping Use   Vaping Use: Some days   Substances: Nicotine, THC, Flavoring, Mixture of cannabinoids  Substance and Sexual Activity   Alcohol use: Yes    Alcohol/week: 3.0 standard drinks of alcohol    Types: 3 Shots of liquor per week    Comment: every day   Drug use: Not Currently    Types: Marijuana    Comment: January   Sexual activity: Not on file  Other Topics Concern  Not on file  Social History Narrative   Not on file   Social Determinants of Health   Financial Resource Strain: Low Risk  (06/27/2021)   Overall Financial Resource Strain (CARDIA)    Difficulty of Paying Living Expenses: Not very hard  Food Insecurity: No Food Insecurity (06/27/2021)   Hunger Vital Sign    Worried About Running Out of Food in the Last Year: Never true    Ran Out of Food in the Last Year: Never true  Transportation Needs: Unmet Transportation Needs (09/10/2021)   PRAPARE - Administrator, Civil Service (Medical): Yes    Lack of Transportation (Non-Medical): No  Physical Activity: Not on file  Stress: Not on file  Social Connections: Not on file  Intimate Partner Violence: Not on file    Physical Exam      Future  Appointments  Date Time Provider Department Center  10/20/2021  9:20 AM Ivonne Andrew, NP [facility] None  10/29/2021  2:00 PM [facility] PA/NP [facility] None  12/01/2021  1:45 PM Windell Norfolk, MD [facility] None       [name], EMT-Paramedic [phone] [facility] Paramedic  10/08/21

## 2021-10-16 ENCOUNTER — Other Ambulatory Visit (HOSPITAL_COMMUNITY): Payer: Self-pay

## 2021-10-16 ENCOUNTER — Other Ambulatory Visit (HOSPITAL_COMMUNITY): Payer: Self-pay | Admitting: Emergency Medicine

## 2021-10-16 NOTE — Progress Notes (Signed)
Paramedicine Encounter    Patient ID: Beth Barnes, female    DOB: 07/25/1995, 26 y.o.   MRN: [id number]   BP 110/70 (BP Location: Right Arm, Patient Position: Sitting)   Pulse 100   Resp 16   SpO2 99%  Weight yesterday-not taken Last visit weight-116lb  ATF Mr. Dismuke A&O x 4, skin W&D w/ good color.  Pt. Has relocated from her [place] [address] to stay with a female friend at [facility] [facility] [facility].  She tells me today that this will be a somewhat temporary residence for her and is planning to move to [place], [place] later this month.  She desires to get referrals to Cardiologists and Neurologists so that she can continue care there.  She reports that she has a PCP there. Her mother lives in [place].  Beth Barnes  reports that she is doing much better in refraining from alcohol especially since she is no longer with her boyfriend.  She reports that she feels safe with this recent move.   Med box reconciled x 1 week.  She denies chest pain or SOB.  She did not bring her scale with her when she moved so no weight was taken to day.  She has no edema noted to her extremites.  Refills called in for Farxiga and Trazadone.  She states she has been having trouble sleeping lately and hopes the Trazadone will help.  In my travels Friday I will p/u her meds at [facility] and drop them by her apartment as it is just around the corner from my office.  Home visit complete.    [name], EMT-Paramedic [phone] 10/17/2021    ATF Ms. Beth Barnes A&O x 4,  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   Acute systolic heart failure (HCC)    Cardiac arrest (HCC) 05/13/2021    Current Outpatient Medications:    acetaminophen (TYLENOL) 325 MG tablet, Take 1-2 tablets (325-650 mg total) by mouth  every 4 (four) hours as needed for mild pain., Disp: , Rfl:    carvedilol (COREG) 3.125 MG tablet, Take 1 tablet (3.125 mg total) by mouth 2 (two) times daily., Disp: 180 tablet, Rfl: 3   dapagliflozin propanediol (FARXIGA) 10 MG TABS tablet, Take 1 tablet (10 mg total) by mouth daily., Disp: 90 tablet, Rfl: 3   dorzolamide-timolol (COSOPT) 22.3-6.8 MG/ML ophthalmic solution, Place 1 drop into the left eye 2 (two) times daily., Disp: , Rfl:    folic acid (FOLVITE) 1 MG tablet, Take 1 tablet (1 mg total) by mouth daily., Disp: 90 tablet, Rfl: 3   levETIRAcetam (KEPPRA) 750 MG tablet, Take 1 tablet (750 mg total) by mouth 2 (two) times daily., Disp: 60 tablet, Rfl: 6   potassium chloride SA (KLOR-CON M) 20 MEQ tablet, Take 1 tablet (20 mEq total) by mouth 2 (two) times daily., Disp: 180 tablet, Rfl: 3   sacubitril-valsartan (ENTRESTO) 24-26 MG, Take 1 tablet by mouth 2 (two) times daily., Disp: 60 tablet, Rfl: 3   spironolactone (ALDACTONE) 25 MG tablet, Take 1 tablet (25 mg total) by mouth daily., Disp: 90 tablet, Rfl: 3 Allergies  Allergen Reactions   Cetirizine & Related Hives      Social History   Socioeconomic History   Marital status: Significant  Other    Spouse name: Not on file   Number of children: 0   Years of education: Not on file   Highest education level: High school graduate  Occupational History    Comment: NO  Tobacco Use   Smoking status: Some Days    Packs/day: 0.50    Years: 5.00    Total pack years: 2.50    Types: Cigarettes   Smokeless tobacco: Never   Tobacco comments:    Smoking cessation  Vaping Use   Vaping Use: Some days   Substances: Nicotine, THC, Flavoring, Mixture of cannabinoids  Substance and Sexual Activity   Alcohol use: Yes    Alcohol/week: 3.0 standard drinks of alcohol    Types: 3 Shots of liquor per week    Comment: every day   Drug use: Not Currently    Types: Marijuana    Comment: January   Sexual activity: Not on file  Other  Topics Concern   Not on file  Social History Narrative   Not on file   Social Determinants of Health   Financial Resource Strain: Low Risk  (06/27/2021)   Overall Financial Resource Strain (CARDIA)    Difficulty of Paying Living Expenses: Not very hard  Food Insecurity: No Food Insecurity (06/27/2021)   Hunger Vital Sign    Worried About Running Out of Food in the Last Year: Never true    Ran Out of Food in the Last Year: Never true  Transportation Needs: Unmet Transportation Needs (09/10/2021)   PRAPARE - Administrator, Civil Service (Medical): Yes    Lack of Transportation (Non-Medical): No  Physical Activity: Not on file  Stress: Not on file  Social Connections: Not on file  Intimate Partner Violence: Not on file    Physical Exam      Future Appointments  Date Time Provider Department Center  10/20/2021  9:20 AM Ivonne Andrew, NP [facility] None  10/29/2021  2:00 PM [facility] PA/NP [facility] None  12/01/2021  1:45 PM Windell Norfolk, MD [facility] None       [name], EMT-Paramedic [phone] [facility] Paramedic  10/17/21

## 2021-10-20 ENCOUNTER — Ambulatory Visit: Payer: Medicaid Other | Admitting: Nurse Practitioner

## 2021-10-23 ENCOUNTER — Telehealth (HOSPITAL_COMMUNITY): Payer: Self-pay | Admitting: Emergency Medicine

## 2021-10-23 NOTE — Telephone Encounter (Signed)
[name] called me @ 11:19 wanting to move her appointment to 3:00 this afternoon.  I had a pre-scheduled appt w/ another pt.  I will see her tomorrow on my lunch break.

## 2021-10-24 ENCOUNTER — Telehealth (HOSPITAL_COMMUNITY): Payer: Self-pay | Admitting: Licensed Clinical Social Worker

## 2021-10-24 ENCOUNTER — Other Ambulatory Visit (HOSPITAL_COMMUNITY): Payer: Self-pay

## 2021-10-24 ENCOUNTER — Other Ambulatory Visit (HOSPITAL_COMMUNITY): Payer: Self-pay | Admitting: Emergency Medicine

## 2021-10-24 NOTE — Progress Notes (Signed)
Paramedicine Encounter    Patient ID: Beth Barnes, female    DOB: 1995-08-25, 26 y.o.   MRN: [id number]   There were no vitals taken for this visit. Weight yesterday-not taken  Last visit weight-not taken  ATF Beth Barnes A&O x 4, skin W&D w/ good color.  Pt denies chest pain or SOB.  Lung sounds clear and equal bilat.  No edema noted.  Med box reconciled.  Refills for Pot Cl and Keppra called in to [facility].  Reminded pt of clinic appointment 9/20 @ 2:00.  Unable to get weight as she no longer has a scale.  Will work on helping her get one.  Home visit complete.    [name], EMT-Paramedic [phone] 10/24/2021  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   Acute systolic heart failure (HCC)    Cardiac arrest (HCC) 05/13/2021    Current Outpatient Medications:    acetaminophen (TYLENOL) 325 MG tablet, Take 1-2 tablets (325-650 mg total) by mouth every 4 (four) hours as needed for mild pain., Disp: , Rfl:    carvedilol (COREG) 3.125 MG tablet, Take 1 tablet (3.125 mg total) by mouth 2 (two) times daily., Disp: 180 tablet, Rfl: 3   dapagliflozin propanediol (FARXIGA) 10 MG TABS tablet, Take 1 tablet (10 mg total) by mouth daily., Disp: 90 tablet, Rfl: 3   dorzolamide-timolol (COSOPT) 22.3-6.8 MG/ML ophthalmic solution, Place 1 drop into the left eye 2 (two) times daily., Disp: , Rfl:    folic acid (FOLVITE) 1 MG tablet, Take 1 tablet (1 mg total) by mouth daily., Disp: 90 tablet, Rfl: 3   levETIRAcetam (KEPPRA) 750 MG tablet, Take 1 tablet (750 mg total) by mouth 2 (two) times daily., Disp: 60 tablet, Rfl: 6   potassium chloride SA (KLOR-CON M) 20 MEQ tablet, Take 1 tablet (20 mEq total) by mouth 2 (two) times daily., Disp: 180 tablet, Rfl: 3   sacubitril-valsartan  (ENTRESTO) 24-26 MG, Take 1 tablet by mouth 2 (two) times daily., Disp: 60 tablet, Rfl: 3   spironolactone (ALDACTONE) 25 MG tablet, Take 1 tablet (25 mg total) by mouth daily., Disp: 90 tablet, Rfl: 3 Allergies  Allergen Reactions   Cetirizine & Related Hives      Social History   Socioeconomic History   Marital status: Significant Other    Spouse name: Not on file   Number of children: 0   Years of education: Not on file   Highest education level: High school graduate  Occupational History    Comment: NO  Tobacco Use   Smoking status: Some Days    Packs/day: 0.50    Years: 5.00    Total pack years: 2.50    Types: Cigarettes   Smokeless tobacco: Never   Tobacco comments:    Smoking cessation  Vaping Use   Vaping Use: Some days   Substances: Nicotine, THC, Flavoring, Mixture of cannabinoids  Substance and Sexual Activity   Alcohol use: Yes    Alcohol/week: 3.0 standard drinks of alcohol    Types: 3 Shots of liquor per week    Comment: every day   Drug use: Not Currently    Types: Marijuana    Comment: January   Sexual activity: Not on file  Other Topics Concern   Not on file  Social History Narrative   Not on file   Social Determinants of Health   Financial Resource Strain: Low Risk  (06/27/2021)   Overall Financial Resource Strain (CARDIA)    Difficulty of Paying Living Expenses: Not very hard  Food Insecurity: No Food Insecurity (06/27/2021)   Hunger Vital Sign    Worried About Running Out of Food in the Last Year: Never true    Ran Out of Food in the Last Year: Never true  Transportation Needs: Unmet Transportation Needs (09/10/2021)   PRAPARE - Administrator, Civil Service (Medical): Yes    Lack of Transportation (Non-Medical): No  Physical Activity: Not on file  Stress: Not on file  Social Connections: Not on file  Intimate Partner Violence: Not on file    Physical Exam      Future Appointments  Date Time Provider Department Center   10/29/2021  2:00 PM [facility] PA/NP [facility] None  12/01/2021  1:45 PM Windell Norfolk, MD [facility] None       [name], EMT-Paramedic [phone] [facility] Paramedic  10/24/21

## 2021-10-24 NOTE — Telephone Encounter (Signed)
H&V Care Navigation CSW Progress Note  Clinical Social Worker received call from pt to request help setting up transport to appt next week.  CSW assisted pt in calling [facility] Medicaid transport [phone]) to set up ride to clinic next week- ride successfully scheduled and pt aware of ride details- pick up from appt scheduled for 3pm  Patient is participating in a Managed Medicaid Plan:  Yes   SDOH Screenings   Food Insecurity: No Food Insecurity (06/27/2021)  Housing: Low Risk  (06/27/2021)  Transportation Needs: Unmet Transportation Needs (09/10/2021)  Alcohol Screen: High Risk (05/19/2021)  Depression (PHQ2-9): Medium Risk (06/03/2021)  Financial Resource Strain: Low Risk  (06/27/2021)  Tobacco Use: High Risk (09/04/2021)    Burna Sis, LCSW Clinical Social Worker [facility] Desk#: [phone] Cell#: [phone]

## 2021-10-29 ENCOUNTER — Telehealth (HOSPITAL_COMMUNITY): Payer: Self-pay | Admitting: Emergency Medicine

## 2021-10-29 ENCOUNTER — Other Ambulatory Visit (HOSPITAL_COMMUNITY): Payer: Self-pay

## 2021-10-29 ENCOUNTER — Encounter (HOSPITAL_COMMUNITY): Payer: Self-pay

## 2021-10-29 ENCOUNTER — Other Ambulatory Visit (HOSPITAL_COMMUNITY): Payer: Self-pay | Admitting: Emergency Medicine

## 2021-10-29 ENCOUNTER — Ambulatory Visit (HOSPITAL_COMMUNITY)
Admission: RE | Admit: 2021-10-29 | Discharge: 2021-10-29 | Disposition: A | Discharge status: Home / Self Care | Payer: Medicaid Other | Source: Ambulatory Visit | Attending: Family Medicine | Admitting: Family Medicine

## 2021-10-29 VITALS — BP 110/78 | HR 72 | Wt 133.8 lb

## 2021-10-29 DIAGNOSIS — Z7984 Long term (current) use of oral hypoglycemic drugs: Secondary | ICD-10-CM | POA: Diagnosis not present

## 2021-10-29 DIAGNOSIS — I5082 Biventricular heart failure: Secondary | ICD-10-CM | POA: Diagnosis not present

## 2021-10-29 DIAGNOSIS — I110 Hypertensive heart disease with heart failure: Secondary | ICD-10-CM | POA: Diagnosis not present

## 2021-10-29 DIAGNOSIS — I429 Cardiomyopathy, unspecified: Secondary | ICD-10-CM | POA: Diagnosis not present

## 2021-10-29 DIAGNOSIS — I10 Essential (primary) hypertension: Secondary | ICD-10-CM | POA: Diagnosis not present

## 2021-10-29 DIAGNOSIS — Z79899 Other long term (current) drug therapy: Secondary | ICD-10-CM | POA: Diagnosis not present

## 2021-10-29 DIAGNOSIS — Z7901 Long term (current) use of anticoagulants: Secondary | ICD-10-CM | POA: Insufficient documentation

## 2021-10-29 DIAGNOSIS — I4901 Ventricular fibrillation: Secondary | ICD-10-CM | POA: Diagnosis not present

## 2021-10-29 DIAGNOSIS — F1010 Alcohol abuse, uncomplicated: Secondary | ICD-10-CM | POA: Diagnosis not present

## 2021-10-29 DIAGNOSIS — Z8674 Personal history of sudden cardiac arrest: Secondary | ICD-10-CM | POA: Diagnosis not present

## 2021-10-29 DIAGNOSIS — H547 Unspecified visual loss: Secondary | ICD-10-CM | POA: Insufficient documentation

## 2021-10-29 DIAGNOSIS — I5022 Chronic systolic (congestive) heart failure: Secondary | ICD-10-CM | POA: Insufficient documentation

## 2021-10-29 DIAGNOSIS — R569 Unspecified convulsions: Secondary | ICD-10-CM | POA: Diagnosis not present

## 2021-10-29 LAB — BASIC METABOLIC PANEL
Anion gap: 9 (ref 5–15)
BUN: 6 mg/dL (ref 6–20)
CO2: 22 mmol/L (ref 22–32)
Calcium: 9 mg/dL (ref 8.9–10.3)
Chloride: 108 mmol/L (ref 98–111)
Creatinine, Ser: 0.86 mg/dL (ref 0.44–1.00)
GFR, Estimated: >60 mL/min (ref >60)
Glucose, Bld: 79 mg/dL (ref 70–99)
Potassium: 3.5 mmol/L (ref 3.5–5.1)
Sodium: 139 mmol/L (ref 135–145)

## 2021-10-29 LAB — BRAIN NATRIURETIC PEPTIDE: B Natriuretic Peptide: 103 pg/mL — ABNORMAL HIGH (ref 0.0–100.0)

## 2021-10-29 NOTE — Progress Notes (Signed)
PCP: Patient, No Pcp Per HF Cardiology: Dr. Aundra Dubin  HPI:  26 y.o. female was referred from [facility] clinic to CHF clinic for ongoing management of HF.  She has congenital blindness and h/o HTN as well as significant ETOH abuse.  She suffered witnessed cardiac arrest in 4/23 while visiting her boyfriend who was hospitalized at [facility]. RNs on the floor attended to her immediately. She was found to be in ventricular fibrillation.  She required epinephrine, cardioversion x 2, sodium bicarbonate.  She was treated with Narcan. Achievement of ROSC after being down for ~10 min. She was intubated and admitted to ICU. Had seizures post arrest and treated w/ Keppra. Transferred for [facility] for further management.  Labs showed hypokalemia and hypomagnesemia and EKG w/ prolonged QT. No acute STE. Electrolyte abnormalties were corrected. Hs-troponin 280>>1,223>>3,101 but felt likely demand ischemia and not ACS.  Of note, Boyfriend reported the pt drinks at least 1/5 of a gallon of liquor daily, also reported in another note by CCM hx of cocaine/marijuana as well (TOX + for benzos only, no ETOH level was done).  Initial Echo 05/14/21 showed severe biventricular failure, LVEF < 20%, RV moderately reduced.  Repeat limited echo 05/19/21 showed interval improvement, w/ LVEF up to 30-35%.  cMRI 05/20/21 showed mild septal hypertrophy 14 mm with mild global hypokinesis EF 42%. There was nonspecific mid myocardial gadolinium uptake on delayed enhancement sequences but no evidence of myocarditis or infiltration. Normal RV size and function. Normal cardiac valves.   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 She was evaluated by EP, ICD not indicated as cause of arrest thought to be reversible.   Once stabilized, she was placed on GDMT w/ Entresto, Jardiance, Aldactone and Coreg. OCP recommended. Referral  placed to GYN to get on OCPs, no showed appointment 05/23. She initially was not compliant with medication regimen likely due to difficulty managing her meds (blind).  She now has paramedicine and is much more compliant with meds.   Echo 8/23 showed EF 55-60%, normal RV.  Today she returns for HF follow up here with [place] with paramedicine. Overall feeling fine. She does not have dyspnea with activity. Denies palpitations, abnormal bleeding, CP, dizziness, edema, or PND/Orthopnea. Appetite ok. No fever or chills. Weight at home 130 pounds. Taking all medications. She has cut back on ETOH. Vapes occasionally. Occasional THC use. Moving back to [place] next month to live closer to her mother Evelene Cro[place] area).   Labs (5/23): BNP 6.5, creatinine 4.2, creatinine 0.87 Labs (8/23): K 3.5, creatinine 0.74  PMH: 1. HTN 2. ETOH abuse 3. Congenital blindness 4. Chronic systolic CHF: Echo (08/16/56) with EF < 20%, moderate RV dysfunction.  - Echo (05/19/21): EF 30-35%.  - Cardiac MRI (05/20/21): LV EF 42%, normal RV EF, nonspecific mid-myocardial LGE.  5. Depression 6. Prior cocaine abuse.  7. VF in setting of prolonged QTc and hypokalemia/hypomagnesemia.   Review of Systems: Cardiac and respiratory - Negative except as mentioned in HPI   Past Medical History:  Diagnosis Date   Blind    Congenital glaucoma of both eyes    Hypokalemia     Current Outpatient Medications  Medication Sig Dispense Refill   acetaminophen (TYLENOL) 325 MG tablet Take 1-2 tablets (325-650 mg total) by mouth every 4 (four) hours as needed for mild pain.     carvedilol (COREG) 3.125 MG tablet Take 1  tablet (3.125 mg total) by mouth 2 (two) times daily. 180 tablet 3   dapagliflozin propanediol (FARXIGA) 10 MG TABS tablet Take 1 tablet (10 mg total) by mouth daily. 90 tablet 3   dorzolamide-timolol (COSOPT) 22.3-6.8 MG/ML ophthalmic solution Place 1 drop into the left eye 2 (two) times daily.     folic acid (FOLVITE) 1 MG  tablet Take 1 tablet (1 mg total) by mouth daily. 90 tablet 3   levETIRAcetam (KEPPRA) 750 MG tablet Take 1 tablet (750 mg total) by mouth 2 (two) times daily. 60 tablet 6   potassium chloride SA (KLOR-CON M) 20 MEQ tablet Take 1 tablet (20 mEq total) by mouth 2 (two) times daily. 180 tablet 3   sacubitril-valsartan (ENTRESTO) 24-26 MG Take 1 tablet by mouth 2 (two) times daily. 60 tablet 3   spironolactone (ALDACTONE) 25 MG tablet Take 1 tablet (25 mg total) by mouth daily. 90 tablet 3   No current facility-administered medications for this encounter.    Allergies  Allergen Reactions   Cetirizine & Related Hives   Social History   Socioeconomic History   Marital status: Significant Other    Spouse name: Not on file   Number of children: 0   Years of education: Not on file   Highest education level: High school graduate  Occupational History    Comment: NO  Tobacco Use   Smoking status: Some Days    Packs/day: 0.50    Years: 5.00    Total pack years: 2.50    Types: Cigarettes   Smokeless tobacco: Never   Tobacco comments:    Smoking cessation  Vaping Use   Vaping Use: Some days   Substances: Nicotine, THC, Flavoring, Mixture of cannabinoids  Substance and Sexual Activity   Alcohol use: Yes    Alcohol/week: 3.0 standard drinks of alcohol    Types: 3 Shots of liquor per week    Comment: every day   Drug use: Not Currently    Types: Marijuana    Comment: January   Sexual activity: Not on file  Other Topics Concern   Not on file  Social History Narrative   Not on file   Social Determinants of Health   Financial Resource Strain: Low Risk  (06/27/2021)   Overall Financial Resource Strain (CARDIA)    Difficulty of Paying Living Expenses: Not very hard  Food Insecurity: No Food Insecurity (06/27/2021)   Hunger Vital Sign    Worried About Running Out of Food in the Last Year: Never true    Ran Out of Food in the Last Year: Never true  Transportation Needs: Unmet  Transportation Needs (09/10/2021)   PRAPARE - Administrator, Civil Service (Medical): Yes    Lack of Transportation (Non-Medical): No  Physical Activity: Not on file  Stress: Not on file  Social Connections: Not on file  Intimate Partner Violence: Not on file   Family History  Problem Relation Age of Onset   Healthy Mother    Healthy Father    BP 110/78   Pulse 72   Wt 60.7 kg (133 lb 12.8 oz)   SpO2 100%   BMI 21.60 kg/m   Wt Readings from Last 3 Encounters:  10/29/21 60.3 kg (133 lb)  10/29/21 60.7 kg (133 lb 12.8 oz)  10/08/21 52.9 kg (116 lb 9.6 oz)   PHYSICAL EXAM: General:  NAD. No resp difficulty HEENT: Blind Neck: Supple. No JVD. Carotids 2+ bilat; no bruits. No lymphadenopathy or thryomegaly  appreciated. Cor: PMI nondisplaced. Regular rate & rhythm. No rubs, gallops or murmurs. Lungs: Clear Abdomen: Soft, nontender, nondistended. No hepatosplenomegaly. No bruits or masses. Good bowel sounds. Extremities: No cyanosis, clubbing, rash, edema Neuro: Alert & oriented x 3, cranial nerves grossly intact. Moves all 4 extremities w/o difficulty. Affect pleasant.  ASSESSMENT & PLAN: 1. VF arrest (4/23): Immediate bystander CPR. Full neurological recovery.  In setting of prolonged QT from hypokalemia/hypomagnesemia from chronic heavy ETOH use  - per EP, no indication for ICD.  - Continue beta blocker. 2. Chronic systolic CHF: VF arrest 4/23. LVEF on echo post arrest <20%, RV moderately reduced.  Repeat limited echo several days post arrest w/ interval improvement, EF up to 30-35%.  cMRI later in 4/23 w/ continued improvement, EF 42%; there was a small amount of nonspecific mid-myocardial LGE.  Cause of cardiomyopathy could be heavy ETOH abuse.  However, with improvement over serial studies, she may have had myocardial stunning from the arrest. CAD unlikely with age and no premature family history.  Echo 8/23 showed EF improved to 55-60%. NYHA class I, she is euvolemic  on exam. She is doing better with medication compliance with help of paramedicine, appreciate their assistance.  - Continue Farxiga 10 mg daily - Continue spironolactone 25 mg daily. BMET today - Continue Entresto 24/26 bid.  - Continue Coreg 3.125 mg bid.  - Limit ETOH. - Reiterated importance of birth control given fetotoxic meds.  She says she will find OBGYN when she moves next month. Not currently on contraception and not planning on sexual activity. 3. Hypertension: BP now controlled. No medication changes. 4. ETOH Abuse: She has cut back a lot. Congratulated.  She is planning on moving to [place] next month. Will need to establish with Cardiology office. We will be happy to see her back on a PRN basis.  Prince Rome, FNP-BC 10/29/21

## 2021-10-29 NOTE — Patient Instructions (Signed)
It was great to see you today! No medication changes are needed at this time.   Labs today We will only contact you if something comes back abnormal or we need to make some changes. Otherwise no news is good news!  Please follow up as needed   Do the following things EVERYDAY: Weigh yourself in the morning before breakfast. Write it down and keep it in a log. Take your medicines as prescribed Eat low salt foods--Limit salt (sodium) to 2000 mg per day.  Stay as active as you can everyday Limit all fluids for the day to less than 2 liters

## 2021-10-29 NOTE — Progress Notes (Signed)
Paramedicine Encounter   Patient ID: Beth Barnes , female,   DOB: Feb 06, 1996,26 y.o.,  MRN: [id number]   Met patient in clinic today with provider.  Time spent with patient 40 minutes  Met with Beth Barnes for her clinic visit with [facility] today.  Pt was informed that her EF had improved to 55%.  Beth Barnes has been doing well with her med compliance and has also been successful in significantly decreasing her alcohol consumption.  She does admit to vapping w/ nicotine and THC and was encouraged to decrease same and/or quit if she can.  No forecasted med changes at this time.  Meds reconciled.  I was able to pick up her Potassium and Keppra from [facility] and give to her without incident. She denies chest pain or SOB.  Lung sounds clear bilat.  No edema.  Her weight is up but no signs or symptoms of fluid overload. She is still planning to move to [facility] next month.  Beth Barnes advised she would reach out to Dr. Aundra Dubin regarding possible heart clinics there for when she moves.  Also discussed that she should strongly consider getting established with GYN so that she can be placed on birth control  as a pregnancy with her medical history could be hazardous.    Beth Barnes, [place] 10/29/2021

## 2021-10-29 NOTE — Telephone Encounter (Signed)
Sent text message to let Gladyce know that [facility] advised she was going to prescribe Lasix 20mg  to take prn swelling an/or SOB/weight gain.   Parrish acknowledged text message.

## 2021-10-30 ENCOUNTER — Other Ambulatory Visit (HOSPITAL_COMMUNITY): Payer: Self-pay

## 2021-10-30 ENCOUNTER — Telehealth (HOSPITAL_COMMUNITY): Payer: Self-pay

## 2021-10-30 MED ORDER — FUROSEMIDE 20 MG PO TABS
20.0000 mg | ORAL_TABLET | Freq: Every day | ORAL | 11 refills | Status: DC | PRN
  Filled 2021-10-30: qty 30, 30d supply, fill #0

## 2021-10-30 NOTE — Telephone Encounter (Signed)
-----   Message from Rafael Bihari, [place] sent at 10/29/2021  4:59 PM EDT ----- BNP mildly elevated.   Please give her Lasix 20 mg PRN to take for SOB/swelling/weight gain.   will also cc DeDe with paramedicine.

## 2021-10-30 NOTE — Telephone Encounter (Signed)
Patient advised by Dede, paramedic and verbalized understanding. Rx sent into patients pharmacy.   Meds ordered this encounter  Medications   furosemide (LASIX) 20 MG tablet    Sig: Take 1 tablet (20 mg total) by mouth daily as needed for fluid or edema.    Dispense:  30 tablet    Refill:  11

## 2021-11-05 ENCOUNTER — Other Ambulatory Visit (HOSPITAL_COMMUNITY): Payer: Self-pay | Admitting: Emergency Medicine
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
## 2021-11-11 ENCOUNTER — Other Ambulatory Visit (HOSPITAL_COMMUNITY): Payer: Self-pay | Admitting: *Deleted

## 2021-11-11 MED ORDER — CARVEDILOL 3.125 MG PO TABS: 3.1250 mg | ORAL_TABLET | Freq: Two times a day (BID) | ORAL | 3 refills | Status: DC

## 2021-11-11 MED ORDER — POTASSIUM CHLORIDE CRYS ER 20 MEQ PO TBCR: 20.0000 meq | EXTENDED_RELEASE_TABLET | Freq: Two times a day (BID) | ORAL | 3 refills | Status: DC

## 2021-11-11 MED ORDER — FUROSEMIDE 20 MG PO TABS: 20.0000 mg | ORAL_TABLET | Freq: Every day | ORAL | 3 refills | Status: DC | PRN

## 2021-11-11 MED ORDER — DAPAGLIFLOZIN PROPANEDIOL 10 MG PO TABS: 10.0000 mg | ORAL_TABLET | Freq: Every day | ORAL | 3 refills | Status: DC

## 2021-11-11 MED ORDER — SPIRONOLACTONE 25 MG PO TABS: 25.0000 mg | ORAL_TABLET | Freq: Every day | ORAL | 3 refills | Status: DC

## 2021-11-12 ENCOUNTER — Telehealth (HOSPITAL_COMMUNITY): Payer: Self-pay | Admitting: Emergency Medicine

## 2021-11-12 NOTE — Telephone Encounter (Signed)
Spoke with Beth Barnes to let her know I picked up her refills from [facility] today and I will deliver them to her tomorrow and she agreed to same.

## 2021-11-14 ENCOUNTER — Telehealth (HOSPITAL_COMMUNITY): Payer: Self-pay | Admitting: Emergency Medicine

## 2021-11-14 ENCOUNTER — Other Ambulatory Visit (HOSPITAL_COMMUNITY): Payer: Self-pay | Admitting: Emergency Medicine

## 2021-11-14 NOTE — Progress Notes (Signed)
This was not a full visit.  I delivered Beth Barnes refills from [facility] for: Beth Barnes, Carvedilol, Folic Acid and Entresto.  ATF Beth Barnes passed out on the sofa.  I had knocked on her door and her roommate let me in.  Beth Barnes initially denied ETOH but I could smell it on her breath then she admitted to same.  She was tearful and mumbling about "I can't have children and I'm sad."   The most I could get from our conversation was that she is again communicating with her ex-boyfriend.  She has had unprotected sex and is not on birth control.  Her boyfriend provided her with the liquor and she had been drinking it straight from the bottle.  She gave me the mostly empty bottle and asked me to pour it down the sink, which I did.   We discussed the hazards of pregnancy with her health condition.   I filled her pill box for her and advised her that we would talk on Tuesday.

## 2021-11-14 NOTE — Telephone Encounter (Signed)
Spoke with Beth Barnes to see if she' spoken with [name] recently.  I've called several times and gone by her apt and she wasn't there.  Beth Barnes advised she would try and call [name] and ask her to call me.    [name], [place] 11/14/2021

## 2021-11-20 ENCOUNTER — Telehealth (HOSPITAL_COMMUNITY): Payer: Self-pay | Admitting: Licensed Clinical Social Worker

## 2021-11-20 NOTE — Telephone Encounter (Signed)
CSW called pt to discuss concerns expressed by paramedic following last home visit- unable to reach- left VM requesting return call  Jorge Ny, [place] Worker [facility] Desk#: [phone] Cell#: [phone]

## 2021-11-27 ENCOUNTER — Telehealth (HOSPITAL_COMMUNITY): Payer: Self-pay | Admitting: Emergency Medicine

## 2021-11-27 NOTE — Telephone Encounter (Signed)
Called Ms. Holloran as a follow up to text message and asked her to return my call.  Let her know I wanted to check on her since I had not heard from her in while and wanted to do a visit and  find out what her status is as far as moving.    [name], [place] 11/27/2021

## 2021-11-27 NOTE — Telephone Encounter (Signed)
Text Ms. Marten to see if I could do a visit today and received no response.    [name], [place] 11/27/2021

## 2021-11-28 ENCOUNTER — Telehealth (HOSPITAL_COMMUNITY): Payer: Self-pay | Admitting: Emergency Medicine

## 2021-11-28 NOTE — Telephone Encounter (Signed)
Called and spoke with Ms. Donna today.  She sounded great over the phone and had no complaints.  I inquired as to whether she was still planning on moving to [place] or not and she advised she was but she still doesn't have a specific date.  Scheduled a home visit for 10/24 @ 1:00.      [name], [place] 11/28/2021

## 2021-12-01 ENCOUNTER — Encounter: Payer: Self-pay | Admitting: Neurology

## 2021-12-01 ENCOUNTER — Ambulatory Visit: Payer: Medicaid Other | Admitting: Neurology

## 2021-12-02 ENCOUNTER — Other Ambulatory Visit (HOSPITAL_COMMUNITY): Payer: Self-pay | Admitting: Emergency Medicine

## 2021-12-02 ENCOUNTER — Other Ambulatory Visit: Payer: Self-pay | Admitting: Neurology

## 2021-12-02 MED ORDER — LEVETIRACETAM 750 MG PO TABS: 750.0000 mg | ORAL_TABLET | Freq: Two times a day (BID) | ORAL | 0 refills | Status: AC

## 2021-12-02 NOTE — Progress Notes (Signed)
Paramedicine Encounter    Patient ID: Beth Barnes, female    DOB: 1995-06-28, 26 y.o.   MRN: [id number]   BP 100/70 (BP Location: Left Arm, Patient Position: Sitting, Cuff Size: Normal)   Pulse 80   Resp 16   Wt 129 lb (58.5 kg)   SpO2 100%   BMI 20.82 kg/m  Weight yesterday-not taken Last visit weight-134lb  Visited with Beth Barnes today.  She was at her old address on [address]  Last visit she was heavily intoxicated but today she appears well.  She denies chest pain or SOB.  Lung sounds clear and equal bilat.  No edema to her lower extremities.  No obvious JVD noted.  Her weight is up today but it has been a couple of weeks since I have seen her.  She has done well with her medications.  Her medications were located at her friends apartment where she has been staying.  She advised she was going back to get them and in the meantime has enough meds reconciled to last till the next visit.   She had planned to move to [place] this month but she had an appointment with her Neurologist and wanted to do that visit before she left so that she could get refills on her [place].  She had issues with transportation and ended up being too late to be seen.  I reached out to her neurologist Dr. April Manson regarding her situation and received a response authorizing a 90 day supply of her Keppra to hold her over till she can get established with a new doctor in [place]. Home visit complete.    [name], [place] 12/02/2021     Patient Care Team: Patient, No Pcp Per as PCP - General (General Practice)  Patient Active Problem List   Diagnosis Date Noted   Heart failure ([place]) 05/20/2021   Blindness and low vision 05/18/2021   Cardiomyopathy, alcoholic ([place]) 25/85/2778   Acute systolic heart failure (HCC)    Cardiac arrest ([place]) 05/13/2021    Current Outpatient Medications:    acetaminophen (TYLENOL) 325 MG tablet, Take 1-2 tablets (325-650 mg total) by mouth every 4 (four)  hours as needed for mild pain., Disp: , Rfl:    carvedilol (COREG) 3.125 MG tablet, Take 1 tablet (3.125 mg total) by mouth 2 (two) times daily., Disp: 180 tablet, Rfl: 3   dapagliflozin propanediol (FARXIGA) 10 MG TABS tablet, Take 1 tablet (10 mg total) by mouth daily., Disp: 90 tablet, Rfl: 3   dorzolamide-timolol (COSOPT) 22.3-6.8 MG/ML ophthalmic solution, Place 1 drop into the left eye 2 (two) times daily., Disp: , Rfl:    folic acid (FOLVITE) 1 MG tablet, Take 1 tablet (1 mg total) by mouth daily., Disp: 90 tablet, Rfl: 3   furosemide (LASIX) 20 MG tablet, Take 1 tablet (20 mg total) by mouth daily as needed for fluid or edema., Disp: 90 tablet, Rfl: 3   levETIRAcetam (KEPPRA) 750 MG tablet, Take 1 tablet (750 mg total) by mouth 2 (two) times daily., Disp: 60 tablet, Rfl: 6   potassium chloride SA (KLOR-CON M) 20 MEQ tablet, Take 1 tablet (20 mEq total) by mouth 2 (two) times daily., Disp: 180 tablet, Rfl: 3   sacubitril-valsartan (ENTRESTO) 24-26 MG, Take 1 tablet by mouth 2 (two) times daily., Disp: 60 tablet, Rfl: 3   spironolactone (ALDACTONE) 25 MG tablet, Take 1 tablet (25 mg total) by mouth daily., Disp: 90 tablet, Rfl: 3 Allergies  Allergen Reactions  Cetirizine & Related Hives      Social History   Socioeconomic History   Marital status: Significant Other    Spouse name: Not on file   Number of children: 0   Years of education: Not on file   Highest education level: High school graduate  Occupational History    Comment: NO  Tobacco Use   Smoking status: Some Days    Packs/day: 0.50    Years: 5.00    Total pack years: 2.50    Types: Cigarettes   Smokeless tobacco: Never   Tobacco comments:    Smoking cessation  Vaping Use   Vaping Use: Some days   Substances: Nicotine, THC, Flavoring, Mixture of cannabinoids  Substance and Sexual Activity   Alcohol use: Yes    Alcohol/week: 3.0 standard drinks of alcohol    Types: 3 Shots of liquor per week    Comment: every  day   Drug use: Not Currently    Types: Marijuana    Comment: January   Sexual activity: Not on file  Other Topics Concern   Not on file  Social History Narrative   Not on file   Social Determinants of Health   Financial Resource Strain: Low Risk  (06/27/2021)   Overall Financial Resource Strain (CARDIA)    Difficulty of Paying Living Expenses: Not very hard  Food Insecurity: No Food Insecurity (06/27/2021)   Hunger Vital Sign    Worried About Running Out of Food in the Last Year: Never true    Ran Out of Food in the Last Year: Never true  Transportation Needs: Unmet Transportation Needs (09/10/2021)   PRAPARE - Administrator, Civil Service (Medical): Yes    Lack of Transportation (Non-Medical): No  Physical Activity: Not on file  Stress: Not on file  Social Connections: Not on file  Intimate Partner Violence: Not on file    Physical Exam      No future appointments.     [name], EMT-Paramedic [phone] [facility] Paramedic  12/02/21

## 2021-12-11 ENCOUNTER — Other Ambulatory Visit (HOSPITAL_COMMUNITY): Payer: Self-pay | Admitting: Emergency Medicine

## 2021-12-11 ENCOUNTER — Other Ambulatory Visit (HOSPITAL_COMMUNITY): Payer: Self-pay
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
## 2021-12-12 ENCOUNTER — Other Ambulatory Visit: Payer: Self-pay

## 2021-12-12 ENCOUNTER — Emergency Department (HOSPITAL_COMMUNITY): Payer: Medicaid Other

## 2021-12-12 ENCOUNTER — Observation Stay (HOSPITAL_COMMUNITY)
Admission: EM | Admit: 2021-12-12 | Discharge: 2021-12-13 | Disposition: A | Discharge status: Home / Self Care | Payer: Medicaid Other | Attending: Family Medicine | Admitting: Family Medicine

## 2021-12-12 ENCOUNTER — Observation Stay (HOSPITAL_BASED_OUTPATIENT_CLINIC_OR_DEPARTMENT_OTHER): Payer: Medicaid Other

## 2021-12-12 ENCOUNTER — Observation Stay (HOSPITAL_COMMUNITY): Payer: Medicaid Other

## 2021-12-12 ENCOUNTER — Encounter (HOSPITAL_COMMUNITY): Payer: Self-pay

## 2021-12-12 DIAGNOSIS — I5021 Acute systolic (congestive) heart failure: Secondary | ICD-10-CM

## 2021-12-12 DIAGNOSIS — F1090 Alcohol use, unspecified, uncomplicated: Secondary | ICD-10-CM | POA: Insufficient documentation

## 2021-12-12 DIAGNOSIS — Q150 Congenital glaucoma: Secondary | ICD-10-CM

## 2021-12-12 DIAGNOSIS — F1010 Alcohol abuse, uncomplicated: Secondary | ICD-10-CM | POA: Diagnosis not present

## 2021-12-12 DIAGNOSIS — R569 Unspecified convulsions: Principal | ICD-10-CM

## 2021-12-12 DIAGNOSIS — T7491XA Unspecified adult maltreatment, confirmed, initial encounter: Secondary | ICD-10-CM

## 2021-12-12 DIAGNOSIS — E876 Hypokalemia: Secondary | ICD-10-CM

## 2021-12-12 DIAGNOSIS — Z79899 Other long term (current) drug therapy: Secondary | ICD-10-CM | POA: Insufficient documentation

## 2021-12-12 LAB — CBC WITH DIFFERENTIAL/PLATELET
Abs Immature Granulocytes: 0.03 10*3/uL (ref 0.00–0.07)
Basophils Absolute: 0.1 10*3/uL (ref 0.0–0.1)
Basophils Relative: 1 %
Eosinophils Absolute: 0.1 10*3/uL (ref 0.0–0.5)
Eosinophils Relative: 1 %
HCT: 34.5 % — ABNORMAL LOW (ref 36.0–46.0)
Hemoglobin: 11 g/dL — ABNORMAL LOW (ref 12.0–15.0)
Immature Granulocytes: 1 %
Lymphocytes Relative: 14 %
Lymphs Abs: 0.9 10*3/uL (ref 0.7–4.0)
MCH: 28.6 pg (ref 26.0–34.0)
MCHC: 31.9 g/dL (ref 30.0–36.0)
MCV: 89.8 fL (ref 80.0–100.0)
Monocytes Absolute: 0.5 10*3/uL (ref 0.1–1.0)
Monocytes Relative: 8 %
Neutro Abs: 4.6 10*3/uL (ref 1.7–7.7)
Neutrophils Relative %: 75 %
Platelets: 362 10*3/uL (ref 150–400)
RBC: 3.84 MIL/uL — ABNORMAL LOW (ref 3.87–5.11)
RDW: 16.5 % — ABNORMAL HIGH (ref 11.5–15.5)
WBC: 6.1 10*3/uL (ref 4.0–10.5)
nRBC: 0 % (ref 0.0–0.2)

## 2021-12-12 LAB — CBG MONITORING, ED
Glucose-Capillary: 57 mg/dL — ABNORMAL LOW (ref 70–99)
Glucose-Capillary: 65 mg/dL — ABNORMAL LOW (ref 70–99)

## 2021-12-12 LAB — I-STAT BETA HCG BLOOD, ED (MC, WL, AP ONLY): I-stat hCG, quantitative: <5 m[IU]/mL (ref <5)

## 2021-12-12 LAB — ECHOCARDIOGRAM COMPLETE
Area-P 1/2: 4.39 cm2
Height: 62 in
S' Lateral: 2.4 cm
Weight: 1968 oz

## 2021-12-12 LAB — COMPREHENSIVE METABOLIC PANEL
ALT: 126 U/L — ABNORMAL HIGH (ref 0–44)
AST: 149 U/L — ABNORMAL HIGH (ref 15–41)
Albumin: 4 g/dL (ref 3.5–5.0)
Alkaline Phosphatase: 84 U/L (ref 38–126)
Anion gap: 23 — ABNORMAL HIGH (ref 5–15)
BUN: 6 mg/dL (ref 6–20)
CO2: 17 mmol/L — ABNORMAL LOW (ref 22–32)
Calcium: 9 mg/dL (ref 8.9–10.3)
Chloride: 98 mmol/L (ref 98–111)
Creatinine, Ser: 0.75 mg/dL (ref 0.44–1.00)
GFR, Estimated: >60 mL/min (ref >60)
Glucose, Bld: 98 mg/dL (ref 70–99)
Potassium: 3 mmol/L — ABNORMAL LOW (ref 3.5–5.1)
Sodium: 138 mmol/L (ref 135–145)
Total Bilirubin: 0.7 mg/dL (ref 0.3–1.2)
Total Protein: 7.5 g/dL (ref 6.5–8.1)

## 2021-12-12 LAB — URINALYSIS, ROUTINE W REFLEX MICROSCOPIC
Bilirubin Urine: NEGATIVE
Glucose, UA: NEGATIVE mg/dL
Ketones, ur: 5 mg/dL — AB
Leukocytes,Ua: NEGATIVE
Nitrite: NEGATIVE
Protein, ur: 100 mg/dL — AB
Specific Gravity, Urine: 1.016 (ref 1.005–1.030)
pH: 5 (ref 5.0–8.0)

## 2021-12-12 LAB — RAPID URINE DRUG SCREEN, HOSP PERFORMED
Amphetamines: NOT DETECTED
Barbiturates: NOT DETECTED
Benzodiazepines: NOT DETECTED
Cocaine: NOT DETECTED
Opiates: NOT DETECTED
Tetrahydrocannabinol: NOT DETECTED

## 2021-12-12 LAB — MAGNESIUM: Magnesium: 2.1 mg/dL (ref 1.7–2.4)

## 2021-12-12 LAB — ETHANOL: Alcohol, Ethyl (B): 73 mg/dL — ABNORMAL HIGH (ref <10)

## 2021-12-12 MED ORDER — DAPAGLIFLOZIN PROPANEDIOL 10 MG PO TABS
10.0000 mg | ORAL_TABLET | Freq: Every day | ORAL | Status: DC
  Administered 2021-12-13: 10 mg via ORAL
  Filled 2021-12-12: qty 1

## 2021-12-12 MED ORDER — THIAMINE HCL 100 MG/ML IJ SOLN
100.0000 mg | Freq: Every day | INTRAMUSCULAR | Status: DC
  Filled 2021-12-12: qty 2

## 2021-12-12 MED ORDER — LORAZEPAM 2 MG/ML IJ SOLN: 1.0000 mg | INTRAMUSCULAR | Status: DC | PRN

## 2021-12-12 MED ORDER — LORAZEPAM 2 MG/ML IJ SOLN
INTRAMUSCULAR | Status: AC
  Administered 2021-12-12: 1 mg via INTRAVENOUS
  Filled 2021-12-12: qty 1

## 2021-12-12 MED ORDER — SODIUM CHLORIDE 0.9 % IV SOLN: 60.0000 mg/kg | Freq: Once | INTRAVENOUS | Status: DC

## 2021-12-12 MED ORDER — ENOXAPARIN SODIUM 40 MG/0.4ML IJ SOSY
40.0000 mg | PREFILLED_SYRINGE | INTRAMUSCULAR | Status: DC
  Administered 2021-12-12: 40 mg via SUBCUTANEOUS
  Filled 2021-12-12: qty 0.4

## 2021-12-12 MED ORDER — SPIRONOLACTONE 25 MG PO TABS
25.0000 mg | ORAL_TABLET | Freq: Every day | ORAL | Status: DC
  Administered 2021-12-13: 25 mg via ORAL
  Filled 2021-12-12: qty 1

## 2021-12-12 MED ORDER — SODIUM CHLORIDE 0.9 % IV SOLN
1850.0000 mg | Freq: Once | INTRAVENOUS | Status: AC
  Administered 2021-12-12: 1850 mg via INTRAVENOUS
  Filled 2021-12-12: qty 18.5

## 2021-12-12 MED ORDER — ACETAMINOPHEN 325 MG PO TABS
650.0000 mg | ORAL_TABLET | Freq: Four times a day (QID) | ORAL | Status: DC | PRN
  Administered 2021-12-12: 650 mg via ORAL
  Filled 2021-12-12: qty 2

## 2021-12-12 MED ORDER — ADULT MULTIVITAMIN W/MINERALS CH
1.0000 | ORAL_TABLET | Freq: Every day | ORAL | Status: DC
  Administered 2021-12-12 – 2021-12-13 (×2): 1 via ORAL
  Filled 2021-12-12 (×2): qty 1

## 2021-12-12 MED ORDER — LORAZEPAM 2 MG/ML IJ SOLN
1.0000 mg | Freq: Once | INTRAMUSCULAR | Status: AC
  Administered 2021-12-12: 1 mg via INTRAVENOUS
  Filled 2021-12-12: qty 1

## 2021-12-12 MED ORDER — POTASSIUM CHLORIDE 10 MEQ/100ML IV SOLN
10.0000 meq | INTRAVENOUS | Status: AC
  Administered 2021-12-12 (×3): 10 meq via INTRAVENOUS
  Filled 2021-12-12 (×3): qty 100

## 2021-12-12 MED ORDER — LEVETIRACETAM 750 MG PO TABS
750.0000 mg | ORAL_TABLET | Freq: Two times a day (BID) | ORAL | Status: DC
  Administered 2021-12-13: 750 mg via ORAL
  Filled 2021-12-12: qty 1

## 2021-12-12 MED ORDER — LORAZEPAM 2 MG/ML IJ SOLN: 1.0000 mg | Freq: Once | INTRAMUSCULAR | Status: DC

## 2021-12-12 MED ORDER — CHLORDIAZEPOXIDE HCL 25 MG PO CAPS: ORAL_CAPSULE | ORAL | 0 refills | Status: DC

## 2021-12-12 MED ORDER — SODIUM CHLORIDE 0.9 % IV BOLUS
1000.0000 mL | Freq: Once | INTRAVENOUS | Status: AC
  Administered 2021-12-12: 1000 mL via INTRAVENOUS

## 2021-12-12 MED ORDER — LORAZEPAM 1 MG PO TABS: 1.0000 mg | ORAL_TABLET | ORAL | Status: DC | PRN

## 2021-12-12 MED ORDER — FOLIC ACID 1 MG PO TABS
1.0000 mg | ORAL_TABLET | Freq: Every day | ORAL | Status: DC
  Administered 2021-12-12 – 2021-12-13 (×2): 1 mg via ORAL
  Filled 2021-12-12 (×2): qty 1

## 2021-12-12 MED ORDER — ONDANSETRON 4 MG PO TBDP
4.0000 mg | ORAL_TABLET | Freq: Once | ORAL | Status: AC | PRN
  Administered 2021-12-12: 4 mg via ORAL
  Filled 2021-12-12: qty 1

## 2021-12-12 MED ORDER — THIAMINE MONONITRATE 100 MG PO TABS
100.0000 mg | ORAL_TABLET | Freq: Every day | ORAL | Status: DC
  Administered 2021-12-12 – 2021-12-13 (×2): 100 mg via ORAL
  Filled 2021-12-12 (×2): qty 1

## 2021-12-12 MED ORDER — LORAZEPAM 2 MG/ML IJ SOLN: 1.0000 mg | Freq: Once | INTRAMUSCULAR | Status: AC

## 2021-12-12 MED ORDER — POTASSIUM CHLORIDE 10 MEQ/100ML IV SOLN
10.0000 meq | Freq: Once | INTRAVENOUS | Status: AC
  Administered 2021-12-12: 10 meq via INTRAVENOUS
  Filled 2021-12-12: qty 100

## 2021-12-12 MED ORDER — ONDANSETRON HCL 4 MG/2ML IJ SOLN
4.0000 mg | Freq: Once | INTRAMUSCULAR | Status: AC
  Administered 2021-12-12: 4 mg via INTRAVENOUS
  Filled 2021-12-12: qty 2

## 2021-12-12 MED ORDER — SODIUM CHLORIDE 0.9 % IV SOLN: INTRAVENOUS | Status: AC

## 2021-12-12 MED ORDER — LEVETIRACETAM IN NACL 1500 MG/100ML IV SOLN
1500.0000 mg | Freq: Once | INTRAVENOUS | Status: AC
  Administered 2021-12-12: 1500 mg via INTRAVENOUS
  Filled 2021-12-12: qty 100

## 2021-12-12 MED ORDER — SACUBITRIL-VALSARTAN 24-26 MG PO TABS
1.0000 | ORAL_TABLET | Freq: Two times a day (BID) | ORAL | Status: DC
  Administered 2021-12-13: 1 via ORAL
  Filled 2021-12-12: qty 1

## 2021-12-12 MED ORDER — POTASSIUM CHLORIDE 20 MEQ PO PACK
40.0000 meq | PACK | Freq: Once | ORAL | Status: AC
  Administered 2021-12-12: 40 meq via ORAL
  Filled 2021-12-12: qty 2

## 2021-12-12 NOTE — ED Notes (Signed)
Had to slow down K infusion for patient comfort.

## 2021-12-12 NOTE — Assessment & Plan Note (Addendum)
Reports drinking Vodka daily (unsure of amount). Denies hx of alcohol withdrawal. Last drink yesterday. Will continue to monitor closely for withdrawal symptoms.  - CIWA protocol and ativan prn (as above) - thiamine -  folate - TOC consult
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
## 2021-12-12 NOTE — ED Notes (Addendum)
When asked if she was up for walking around, pt shook her head no. Says she feels "too weak".

## 2021-12-12 NOTE — Assessment & Plan Note (Signed)
K was 3.4 this morning and patient repleted with 40 mEq of potassium chloride. -Continue daily a.m. BMP -Replete K as needed.

## 2021-12-12 NOTE — Assessment & Plan Note (Addendum)
Patient with history of seizure.  Admitted for multiple seizures yesterday 11/3. She is s/p Keppra loading dose. No seizure-like activity since admission.  Overnight EEG was normal. Her exam was normal.  Patient reports compliance to home seizure meds but reports recent illness in the form of subjective fever, chills and diarrhea with known sick contact.  Seizure could likely be triggered by a recent viral illness - Continue home Keppra  - CIWA protocol with ativan prn  - Prn ativan for sz >40min - Seizure precautions - Cardiac monitoring - PT/OT

## 2021-12-12 NOTE — Progress Notes (Signed)
FMTS Brief Progress Note  S:Patient reporting that she has had 2 episodes of vomiting today, one since she has gotten to the floor. She is not currently feeling nauseous but is wanting something in case she feels it overnight. She is currently denying any pain anywhere other than her lips, which are currently cracked.    O: BP 106/77 (BP Location: Right Arm)   Pulse 82   Temp 98.2 F (36.8 C) (Oral)   Resp 14   Ht 5\' 2"  (1.575 m)   Wt 55.8 kg   SpO2 100%   BMI 22.50 kg/m   General: NAD, sitting up in bed, dry and cracked lips CV: RRR Respiratory: breathing comfortably on room air  A/P: Nausea and vomiting - PRN zofran ODT x1 dose as needed overnight, preference to avoid QTc prolonging medications as able - Orders reviewed. Labs for AM ordered, which was adjusted as needed.   Seizures - No change in plans from day-team, continue Keppra - Seizure monitoring with PRN ativan  Alcohol use - CIWA protocol with PRN Ativan  Rise Patience, DO 12/12/2021, 9:09 PM PGY-3, [facility] Night Resident  Please page [phone] with questions.

## 2021-12-12 NOTE — Hospital Course (Addendum)
Beth Barnes is a 26 y.o. female who was admitted to the [place] at [facility] for seizure. Hospital course is outlined below by system.    Seizure Patient with history of seizure presented to the [facility] ED after multiple seizures (x2) on 11/3.  Endorses compliance with her home Keppra.  Unclear cause of recent seizure but considered hypoglycemia and recent report of possible viral illness as patient reported subjective fever, chills and diarrhea with known sick contact in friend who had viral illness.  She was treated with loading dose of Keppra and continued on home Keppra while admitted.  No seizure-like activity during admission and routine EEG was normal.  On exam patient was alert and oriented without any focal neurologic findings.  Overall was stable and discharged.  Hypokalemia And was hypokalemic on admission.  Her electrolytes are monitored with morning BMP and potassium was repleted appropriately prior to discharge.  EKG showed normal sinus rhythm.  No chest pain or palpitations.  Systolic heart failure [facility]) Patient with history of systolic heart failure on admission appears euvolemic with clear breath sounds bilaterally.. Echo showed EF of 60-65% with normal LV function which is improved from 2 months ago with EF of 55-60%.  He was continued on her home entresto, farxiga and  Alcohol Use Patient was on CIWA protocol and showed no signs of withdrawal. VVS was stable at discharge  PCP follow-up recommendations -Need repeat BMP to monitor K

## 2021-12-12 NOTE — Progress Notes (Signed)
  Echocardiogram 2D Echocardiogram has been performed.  Beth Barnes 12/12/2021, 5:09 PM

## 2021-12-12 NOTE — ED Provider Notes (Signed)
Ultrasound ED Peripheral IV (Provider)  Date/Time: 12/12/2021 9:01 AM  Performed by: Tedd Sias, PA Authorized by: Tedd Sias, PA   Procedure details:    Indications: hydration and multiple failed IV attempts     Skin Prep: chlorhexidine gluconate     Location:  Left AC   Angiocath:  20 G   Bedside Ultrasound Guided: Yes     Images: not archived     Patient tolerated procedure without complications: Yes     Dressing applied: Yes       Tedd Sias, PA 12/12/21 0901    Isla Pence, MD 12/12/21 [phone]
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
## 2021-12-12 NOTE — ED Notes (Signed)
Patient vomited enough to fill up 2,  8 oz cups +, MD notified.

## 2021-12-12 NOTE — ED Notes (Signed)
Pt left the floor in stable condition with staff and her friend.

## 2021-12-12 NOTE — Care Management (Signed)
SA resources added to AVS

## 2021-12-12 NOTE — Assessment & Plan Note (Signed)
Echo improved EF of 60-65% with normal LV function from 2 months ago with EF of 55-60%.  On exam appears euvolemic with clear breath sounds bilaterally.VVS -Restarted home entresto, farxiga and spiro -strict I/Os -monitor fluid status

## 2021-12-12 NOTE — Discharge Instructions (Addendum)
                  Intensive Outpatient Programs  [facility]    [facility] [address]     [address] [place],  [place]     [place], [place] [phone]      [phone]  [facility]   [facility]  (Inpatient and outpatient)  [phone] (Suboxone and Methadone) [address]           [phone]           [facility]    [facility] - Intensive Outpatient [address]     [address] [place], [place] [address]     [place], [employer]  [phone]      [phone]  [facility] (Outpatient, Inpatient, [facility]  [facility] (Groups and Residental) (insurance only) [phone]    [place], [place]          [phone]       [facility]    [facility] (for caregivers and family) [address]     [address] [place], [place]     [place], [place] [phone]      [phone]  Residential Treatment Programs  [facility]  [facility](2 years) Residential: 90 days)  [facility] [address]      [address] [place], [place]     [place], [place] [phone]      [phone] or [phone]  [facility]    [facility] [address]      [address] [place], [place]     [place], [place] [phone]      [phone]  [facility]   [facility] ([facility]) [address]     [address] [place], [place] [address]     [place], [place] [phone]      [phone] Admissions: 8am-3pm M-F  [facility]: Residential Program (90 Days)              [facility]: [facility]  [place], [place]     [place], [place]  [phone] or [phone]    (Walk in Hours over the weekend or by referral)   [facility]: [facility]:[phone] (for crisis  response 24 hours a day) 

## 2021-12-13 DIAGNOSIS — I5021 Acute systolic (congestive) heart failure: Secondary | ICD-10-CM | POA: Diagnosis not present

## 2021-12-13 DIAGNOSIS — F1010 Alcohol abuse, uncomplicated: Secondary | ICD-10-CM | POA: Diagnosis not present

## 2021-12-13 DIAGNOSIS — E876 Hypokalemia: Secondary | ICD-10-CM | POA: Diagnosis not present

## 2021-12-13 DIAGNOSIS — Z79899 Other long term (current) drug therapy: Secondary | ICD-10-CM | POA: Diagnosis not present

## 2021-12-13 DIAGNOSIS — R569 Unspecified convulsions: Secondary | ICD-10-CM | POA: Diagnosis not present

## 2021-12-13 LAB — BASIC METABOLIC PANEL
Anion gap: 10 (ref 5–15)
Anion gap: 9 (ref 5–15)
BUN: 5 mg/dL — ABNORMAL LOW (ref 6–20)
BUN: <5 mg/dL — ABNORMAL LOW (ref 6–20)
CO2: 19 mmol/L — ABNORMAL LOW (ref 22–32)
CO2: 22 mmol/L (ref 22–32)
Calcium: 8.7 mg/dL — ABNORMAL LOW (ref 8.9–10.3)
Calcium: 8.7 mg/dL — ABNORMAL LOW (ref 8.9–10.3)
Chloride: 106 mmol/L (ref 98–111)
Chloride: 109 mmol/L (ref 98–111)
Creatinine, Ser: 0.74 mg/dL (ref 0.44–1.00)
Creatinine, Ser: 0.75 mg/dL (ref 0.44–1.00)
GFR, Estimated: >60 mL/min (ref >60)
GFR, Estimated: >60 mL/min (ref >60)
Glucose, Bld: 60 mg/dL — ABNORMAL LOW (ref 70–99)
Glucose, Bld: 76 mg/dL (ref 70–99)
Potassium: 3.4 mmol/L — ABNORMAL LOW (ref 3.5–5.1)
Potassium: 3.5 mmol/L (ref 3.5–5.1)
Sodium: 137 mmol/L (ref 135–145)
Sodium: 138 mmol/L (ref 135–145)

## 2021-12-13 LAB — CBC
HCT: 29.2 % — ABNORMAL LOW (ref 36.0–46.0)
Hemoglobin: 9.3 g/dL — ABNORMAL LOW (ref 12.0–15.0)
MCH: 28.4 pg (ref 26.0–34.0)
MCHC: 31.8 g/dL (ref 30.0–36.0)
MCV: 89.3 fL (ref 80.0–100.0)
Platelets: 211 10*3/uL (ref 150–400)
RBC: 3.27 MIL/uL — ABNORMAL LOW (ref 3.87–5.11)
RDW: 16.4 % — ABNORMAL HIGH (ref 11.5–15.5)
WBC: 6.3 10*3/uL (ref 4.0–10.5)
nRBC: 0 % (ref 0.0–0.2)

## 2021-12-13 LAB — TSH: TSH: 2.137 u[IU]/mL (ref 0.350–4.500)

## 2021-12-13 LAB — LEVETIRACETAM LEVEL: Levetiracetam Lvl: <2 ug/mL — ABNORMAL LOW (ref 10.0–40.0)

## 2021-12-13 LAB — MAGNESIUM: Magnesium: 1.9 mg/dL (ref 1.7–2.4)

## 2021-12-13 MED ORDER — POTASSIUM CHLORIDE CRYS ER 20 MEQ PO TBCR
40.0000 meq | EXTENDED_RELEASE_TABLET | Freq: Once | ORAL | Status: AC
  Administered 2021-12-13: 40 meq via ORAL
  Filled 2021-12-13: qty 2

## 2021-12-13 NOTE — Discharge Summary (Signed)
[facility] Discharge Summary  Patient name: Beth Barnes Medical record number: [id number] Date of birth: 01-30-1996 Age: 26 y.o. Gender: female Date of Admission: 12/12/2021  Date of Discharge: 12/13/21 Admitting Physician: Leeanne Rio, MD  Primary Care Provider: Patient, No Pcp Per Consultants: None  Indication for Hospitalization: Seizure  Brief Hospital Course:  Beth Barnes is a 26 y.o. female who was admitted to the [facility] [facility] for seizure. Hospital course is outlined below by system.    Seizure Patient with history of seizure present to the [facility] ED after multiple seizures (x2) on 11/3.  Endorses compliance with her home Keppra.  Unclear cause of recent seizure but considered hypoglycemia and recent report of possible viral illness as patient reported vague subjective fever, chills and diarrhea with known sick contact in friend who had viral illness.  She was treated with loading dose of Keppra and continued on home Keppra while admitted.  No seizure-like activity during admission and routine EEG was normal.  Physical exam patient was alert and oriented without any focal neurologic finding.  Overall was stable and discharged.  Hypokalemia And was hypokalemic on admission.  Her electrolytes are monitored with morning BMP and potassium was repleted appropriately prior to discharge.  EKG showed normal sinus rhythm.  No chest pain or palpitations.  PCP follow-up recommendations -Need repeat BMP to monitor K    Discharge Diagnoses/Problem List:  * Seizure ([place]) Hypokalemia Alcohol use disorder Acute systolic heart failure ([place])  Disposition: Home  Discharge Condition: Stable  Discharge Exam:  General: Legally blind, well-appearing. HEENT: Atraumatic, MMM, No sclera icterus CV: RRR, no murmurs, normal S1/S2 Pulm: CTAB, good WOB on RA, no crackles or wheezing Abd: Soft, no distension, no  tenderness Skin: dry, warm Ext: No BLE edema, +2 Pedal and radial pulse.   Significant Procedures: EEG  Significant Labs and Imaging:  Recent Labs  Lab 12/12/21 0902 12/13/21 0300  WBC 6.1 6.3  HGB 11.0* 9.3*  HCT 34.5* 29.2*  PLT 362 211   Recent Labs  Lab 12/12/21 0902 12/12/21 2351 12/13/21 0300  NA 138 138 137  K 3.0* 3.5 3.4*  CL 98 109 106  CO2 17* 19* 22  GLUCOSE 98 60* 76  BUN 6 <5* 5*  CREATININE 0.75 0.75 0.74  CALCIUM 9.0 8.7* 8.7*  MG 2.1  --  1.9  ALKPHOS 84  --   --   AST 149*  --   --   ALT 126*  --   --   ALBUMIN 4.0  --   --     EEG adult  Result Date: 12/13/2021 Lora Havens, MD     12/13/2021  7:25 AM Patient Name: Beth Barnes MRN: [id number] Epilepsy Attending: Lora Havens Referring Physician/Provider: Donney Dice, DO Date: 12/12/2021 Duration: 21.33 mins Patient history:  26 y.o. female w/ hx of congenital glaucoma, legally blind, seizures, CHF, hx of cardiac arrest, alcohol use disorder presenting with seizures. EEG to evaluate for seizure Level of alertness: Awake AEDs during EEG study: LEV Technical aspects: This EEG study was done with scalp electrodes positioned according to the 10-20 International system of electrode placement. Electrical activity was reviewed with band pass filter of 1-70Hz , sensitivity of 7 uV/mm, display speed of 7mm/sec with a 60Hz  notched filter applied as appropriate. EEG data were recorded continuously and digitally stored.  Video monitoring was available and reviewed as appropriate. Description: The posterior dominant rhythm consists of 9 Hz  activity of moderate voltage (25-35 uV) seen predominantly in posterior head regions, symmetric and reactive to eye opening and eye closing. Hyperventilation and photic stimulation were not performed.   IMPRESSION: This study is within normal limits. No seizures or epileptiform discharges were seen throughout the recording. A normal interictal EEG does not exclude  the diagnosis  of epilepsy. Lora Havens   ECHOCARDIOGRAM COMPLETE  Result Date: 12/12/2021    ECHOCARDIOGRAM REPORT   Patient Name:   Beth Barnes Date of Exam: 12/12/2021 Medical Rec #:  [id number]     Height:       62.0 in Accession #:    [id number]    Weight:       123.0 lb Date of Birth:  Jun 22, 1995     BSA:          1.555 m Patient Age:    26 years      BP:           140/99 mmHg Patient Gender: F             HR:           87 bpm. Exam Location:  Inpatient Procedure: 2D Echo Indications:    congestive heart failure  History:        Patient has prior history of Echocardiogram examinations, most                 recent 09/12/2021. Risk Factors:Current Smoker and alcohol abuse.  Sonographer:    Johny Chess RDCS Referring Phys: [place]  1. Left ventricular ejection fraction, by estimation, is 60 to 65%. The left ventricle has normal function. The left ventricle has no regional wall motion abnormalities. Left ventricular diastolic parameters were normal.  2. Right ventricular systolic function is normal. The right ventricular size is normal. There is normal pulmonary artery systolic pressure.  3. The mitral valve is normal in structure. Mild mitral valve regurgitation. No evidence of mitral stenosis.  4. The aortic valve is normal in structure. Aortic valve regurgitation is not visualized. No aortic stenosis is present.  5. The inferior vena cava is normal in size with greater than 50% respiratory variability, suggesting right atrial pressure of 3 mmHg. Comparison(s): No significant change from prior study. FINDINGS  Left Ventricle: Left ventricular ejection fraction, by estimation, is 60 to 65%. The left ventricle has normal function. The left ventricle has no regional wall motion abnormalities. The left ventricular internal cavity size was normal in size. There is  no left ventricular hypertrophy. Left ventricular diastolic parameters were normal. Right Ventricle: The right ventricular size  is normal. Right ventricular systolic function is normal. There is normal pulmonary artery systolic pressure. The tricuspid regurgitant velocity is 1.97 m/s, and with an assumed right atrial pressure of 3 mmHg,  the estimated right ventricular systolic pressure is [id number]. Left Atrium: Left atrial size was normal in size. Right Atrium: Right atrial size was normal in size. Pericardium: There is no evidence of pericardial effusion. Mitral Valve: The mitral valve is normal in structure. Mild mitral valve regurgitation. No evidence of mitral valve stenosis. Tricuspid Valve: The tricuspid valve is normal in structure. Tricuspid valve regurgitation is trivial. No evidence of tricuspid stenosis. Aortic Valve: The aortic valve is normal in structure. Aortic valve regurgitation is not visualized. No aortic stenosis is present. Pulmonic Valve: The pulmonic valve was normal in structure. Pulmonic valve regurgitation is not visualized. No evidence of pulmonic stenosis. Aorta: The aortic root is  normal in size and structure. Venous: The inferior vena cava is normal in size with greater than 50% respiratory variability, suggesting right atrial pressure of 3 mmHg. IAS/Shunts: No atrial level shunt detected by color flow Doppler.  LEFT VENTRICLE PLAX 2D LVIDd:         4.20 cm   Diastology LVIDs:         2.40 cm   LV e' medial:    13.60 cm/s LV PW:         0.70 cm   LV E/e' medial:  7.4 LV IVS:        0.80 cm   LV e' lateral:   16.20 cm/s LVOT diam:     1.90 cm   LV E/e' lateral: 6.2 LVOT Area:     2.84 cm  RIGHT VENTRICLE             IVC RV S prime:     14.10 cm/s  IVC diam: 1.20 cm TAPSE (M-mode): 2.5 cm LEFT ATRIUM             Index        RIGHT ATRIUM          Index LA diam:        2.80 cm 1.80 cm/m   RA Area:     9.63 cm LA Vol (A2C):   35.5 ml 22.83 ml/m  RA Volume:   20.90 ml 13.44 ml/m LA Vol (A4C):   17.9 ml 11.51 ml/m LA Biplane Vol: 27.1 ml 17.43 ml/m   AORTA Ao Root diam: 2.80 cm Ao Asc diam:  2.10 cm MITRAL  VALVE                TRICUSPID VALVE MV Area (PHT): 4.39 cm     TR Peak grad:   15.5 mmHg MV Decel Time: 173 msec     TR Vmax:        197.00 cm/s MV E velocity: 100.00 cm/s MV A velocity: 69.70 cm/s   SHUNTS MV E/A ratio:  1.43         Systemic Diam: 1.90 cm Olga Millers MD Electronically signed by Olga Millers MD Signature Date/Time: 12/12/2021/5:15:25 PM    Final    CT Head Wo Contrast  Result Date: 12/12/2021 CLINICAL DATA:  Trauma, altered mental status EXAM: CT HEAD WITHOUT CONTRAST TECHNIQUE: Contiguous axial images were obtained from the base of the skull through the vertex without intravenous contrast. RADIATION DOSE REDUCTION: This exam was performed according to the departmental dose-optimization program which includes automated exposure control, adjustment of the mA and/or kV according to patient size and/or use of iterative reconstruction technique. COMPARISON:  05/13/2021 FINDINGS: Brain: No acute intracranial findings are seen. There are no signs of bleeding within the cranium. Cortical sulci are prominent. There is no focal mass effect. Vascular: Unremarkable. Skull: Unremarkable. Sinuses/Orbits: No acute findings are seen. There is opacification of right optic globe suggesting previous intervention. Other: None IMPRESSION: No acute intracranial findings are seen in noncontrast CT brain. Atrophy. Electronically Signed   By: Ernie Avena M.D.   On: 12/12/2021 14:57     Results/Tests Pending at Time of Discharge: None  Discharge Medications:  Allergies as of 12/13/2021       Reactions   Cetirizine & Related Hives        Medication List     TAKE these medications    acetaminophen 500 MG tablet Commonly known as: TYLENOL Take 1,000 mg by mouth every 6 (six)  hours as needed for moderate pain or mild pain.   carvedilol 3.125 MG tablet Commonly known as: COREG Take 1 tablet (3.125 mg total) by mouth 2 (two) times daily.   dapagliflozin propanediol 10 MG Tabs  tablet Commonly known as: FARXIGA Take 1 tablet (10 mg total) by mouth daily.   dorzolamide-timolol 2-0.5 % ophthalmic solution Commonly known as: COSOPT Place 1 drop into the left eye 2 (two) times daily.   Entresto 24-26 MG Generic drug: sacubitril-valsartan Take 1 tablet by mouth 2 (two) times daily.   folic acid 1 MG tablet Commonly known as: FOLVITE Take 1 tablet (1 mg total) by mouth daily.   furosemide 20 MG tablet Commonly known as: Lasix Take 1 tablet (20 mg total) by mouth daily as needed for fluid or edema.   levETIRAcetam 750 MG tablet Commonly known as: KEPPRA Take 1 tablet (750 mg total) by mouth 2 (two) times daily.   potassium chloride SA 20 MEQ tablet Commonly known as: KLOR-CON M Take 1 tablet (20 mEq total) by mouth 2 (two) times daily.   spironolactone 25 MG tablet Commonly known as: ALDACTONE Take 1 tablet (25 mg total) by mouth daily.   traZODone 50 MG tablet Commonly known as: DESYREL Take 100 mg by mouth at bedtime as needed.        Discharge Instructions: Please refer to Patient Instructions section of EMR for full details.  Patient was counseled important signs and symptoms that should prompt return to medical care, changes in medications, dietary instructions, activity restrictions, and follow up appointments.   Follow-Up Appointments:  Follow-up [facility] up.   Why: please make an appointment to establish a primary care doctor. they have finaical counselling and a pharmacy Contact information: [place] [phone]                Alen Bleacher, MD 12/13/2021, 2:06 PM PGY-2, [place]

## 2021-12-13 NOTE — Procedures (Signed)
Patient Name: Beth Barnes  MRN: [id number]  Epilepsy Attending: Lora Havens  Referring Physician/Provider: Donney Dice, DO  Date: 12/12/2021 Duration: 21.33 mins  Patient history:  26 y.o. female w/ hx of congenital glaucoma, legally blind, seizures, CHF, hx of cardiac arrest, alcohol use disorder presenting with seizures. EEG to evaluate for seizure  Level of alertness: Awake  AEDs during EEG study: LEV  Technical aspects: This EEG study was done with scalp electrodes positioned according to the 10-20 International system of electrode placement. Electrical activity was reviewed with band pass filter of 1-70Hz , sensitivity of 7 uV/mm, display speed of 11mm/sec with a 60Hz  notched filter applied as appropriate. EEG data were recorded continuously and digitally stored.  Video monitoring was available and reviewed as appropriate.  Description: The posterior dominant rhythm consists of 9 Hz activity of moderate voltage (25-35 uV) seen predominantly in posterior head regions, symmetric and reactive to eye opening and eye closing. Hyperventilation and photic stimulation were not performed.     IMPRESSION: This study is within normal limits. No seizures or epileptiform discharges were seen throughout the recording.  A normal interictal EEG does not exclude  the diagnosis of epilepsy.  Jeanene Mena Barbra Sarks

## 2021-12-13 NOTE — Plan of Care (Signed)

## 2021-12-13 NOTE — Progress Notes (Signed)
EEG complete - results pending 

## 2021-12-13 NOTE — Progress Notes (Addendum)
     Daily Progress Note Intern Pager: [phone]  Patient name: Beth Barnes Medical record number: [id number] Date of birth: 01/28/1996 Age: 25 y.o. Gender: female  Primary Care Provider: Patient, No Pcp Per Consultants: None Code Status: Full  Pt Overview and Major Events to Date:  11/3: Admitted  Assessment and Plan: Beth Barnes is a 26 year old female presenting with multiple seizures.. Pertinent PMH/PSH includes legally blind due to congenital glaucoma, seizure, CHF, Hx of cardiac arrest, and alcohol use disorder. * Seizure [facility]) Patient with history of seizure.  Admitted for multiple seizures yesterday 11/3. She is s/p Keppra loading dose. No seizure-like activity since admission.  Overnight EEG was normal. Her exam was normal.  Patient reports compliance to home seizure meds but reports recent illness in the form of subjective fever, chills and diarrhea with known sick contact.  Seizure could likely be triggered by a recent viral illness - Continue home Keppra  - CIWA protocol with ativan prn  - Prn ativan for sz >70min - Seizure precautions - Cardiac monitoring - PT/OT  Hypokalemia K was 3.4 this morning and patient repleted with 40 mEq of potassium chloride. -Continue daily a.m. BMP -Replete K as needed.  Alcohol use disorder Noted last drink was 11/02.  Last CIWA scores overnight with 2-0-0.  Patient does not appear to be in withdrawal and vitals are stable. - CIWA protocol and ativan prn  - thiamine -  folate - TOC consult   Acute systolic heart failure ([place]) Echo improved EF of 60-65% with normal LV function from 2 months ago with EF of 55-60%.  On exam appears euvolemic with clear breath sounds bilaterally.VVS -Restarted home entresto, farxiga and spiro -strict I/Os -monitor fluid status     FEN/GI: Regular diet PPx: Lovenox Dispo:Home pending clinical improvement .   Subjective:  Beth Barnes reports she is doing well and has no complain today. She  endorsed complains to her home keppra med.  Objective: Temp:  [97.4 F (36.3 C)-98.4 F (36.9 C)] 97.4 F (36.3 C) (11/04 0926) Pulse Rate:  [56-109] 56 (11/04 0926) Resp:  [14-21] 18 (11/04 0926) BP: (106-145)/(77-99) 132/85 (11/04 0926) SpO2:  [98 %-100 %] 100 % (11/04 0926) Physical Exam: General: Legally blind, well-appearing. HEENT: Atraumatic, MMM, No sclera icterus CV: RRR, no murmurs, normal S1/S2 Pulm: CTAB, good WOB on RA, no crackles or wheezing Abd: Soft, no distension, no tenderness Skin: dry, warm Ext: No BLE edema, +2 Pedal and radial pulse.   Laboratory: Most recent CBC Lab Results  Component Value Date   WBC 6.3 12/13/2021   HGB 9.3 (L) 12/13/2021   HCT 29.2 (L) 12/13/2021   MCV 89.3 12/13/2021   PLT 211 12/13/2021   Most recent BMP    Latest Ref Rng & Units 12/13/2021    3:00 AM  BMP  Glucose 70 - 99 mg/dL 76   BUN 6 - 20 mg/dL 5   Creatinine 0.44 - 1.00 mg/dL 0.74   Sodium 135 - 145 mmol/L 137   Potassium 3.5 - 5.1 mmol/L 3.4   Chloride 98 - 111 mmol/L 106   CO2 22 - 32 mmol/L 22   Calcium 8.9 - 10.3 mg/dL 8.7     Imaging/Diagnostic Tests: EEG My interpretation: No seizure epileptiform discharge. Alen Bleacher, MD 12/13/2021, 1:23 PM  PGY-2, [place] Intern pager: [phone], text pages welcome Secure chat group [place]

## 2021-12-17 ENCOUNTER — Other Ambulatory Visit (HOSPITAL_COMMUNITY): Payer: Self-pay | Admitting: Emergency Medicine
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
## 2021-12-19 ENCOUNTER — Other Ambulatory Visit (HOSPITAL_COMMUNITY): Payer: Self-pay | Admitting: Emergency Medicine

## 2021-12-19 NOTE — Progress Notes (Signed)
Paramedicine Encounter    Patient ID: Beth Barnes, female    DOB: [birth year], 26 y.o.   MRN: [id number]   BP 130/80 (BP Location: Left Arm, Patient Position: Sitting, Cuff Size: Normal)   Pulse 78   Resp 16   Wt 127 lb 3.2 oz (57.7 kg)   SpO2 98%   BMI 23.27 kg/m  Weight yesterday-not taken Last visit weight-123lb  ATF [name] A&O x 4, skin W&D w/ good color.  She denies chest pain or SOB.  Lung sounds clear and equal bilat.  No edema noted.  Her pill box is set up for 2 weeks.  No pending appointments at this time.  Home visit complete.  Beth Barnes, EMT-Paramedic [phone] 12/19/2021  Patient Care Team: Patient, No Pcp Per as PCP - General (General Practice)  Patient Active Problem List   Diagnosis Date Noted   Seizure (HCC) 12/12/2021   Alcohol use disorder 12/12/2021   Hypokalemia 12/12/2021   ETOH abuse 12/12/2021   Heart failure (HCC) 05/20/2021   Blindness and low vision 05/18/2021   Cardiomyopathy, alcoholic (HCC) 05/18/2021   Acute systolic heart failure (HCC)    Cardiac arrest (HCC) 05/13/2021    Current Outpatient Medications:    acetaminophen (TYLENOL) 500 MG tablet, Take 1,000 mg by mouth every 6 (six) hours as needed for moderate pain or mild pain., Disp: , Rfl:    carvedilol (COREG) 3.125 MG tablet, Take 1 tablet (3.125 mg total) by mouth 2 (two) times daily., Disp: 180 tablet, Rfl: 3   dapagliflozin propanediol (FARXIGA) 10 MG TABS tablet, Take 1 tablet (10 mg total) by mouth daily., Disp: 90 tablet, Rfl: 3   dorzolamide-timolol (COSOPT) 22.3-6.8 MG/ML ophthalmic solution, Place 1 drop into the left eye 2 (two) times daily. (Patient not taking: Reported on 12/17/2021), Disp: , Rfl:    folic acid (FOLVITE) 1 MG tablet, Take 1 tablet (1 mg total) by mouth daily., Disp: 90 tablet, Rfl: 3   furosemide (LASIX) 20 MG tablet, Take 1 tablet (20 mg total) by mouth daily as needed for fluid or edema., Disp: 90 tablet, Rfl: 3   levETIRAcetam (KEPPRA) 750 MG  tablet, Take 1 tablet (750 mg total) by mouth 2 (two) times daily., Disp: 180 tablet, Rfl: 0   potassium chloride SA (KLOR-CON M) 20 MEQ tablet, Take 1 tablet (20 mEq total) by mouth 2 (two) times daily., Disp: 180 tablet, Rfl: 3   sacubitril-valsartan (ENTRESTO) 24-26 MG, Take 1 tablet by mouth 2 (two) times daily., Disp: 60 tablet, Rfl: 3   spironolactone (ALDACTONE) 25 MG tablet, Take 1 tablet (25 mg total) by mouth daily., Disp: 90 tablet, Rfl: 3   traZODone (DESYREL) 50 MG tablet, Take 100 mg by mouth at bedtime as needed., Disp: , Rfl:  Allergies  Allergen Reactions   Cetirizine & Related Hives      Social History   Socioeconomic History   Marital status: Significant Other    Spouse name: Not on file   Number of children: 0   Years of education: Not on file   Highest education level: High school graduate  Occupational History    Comment: NO  Tobacco Use   Smoking status: Some Days    Packs/day: 0.50    Years: 5.00    Total pack years: 2.50    Types: Cigarettes   Smokeless tobacco: Never   Tobacco comments:    Smoking cessation  Vaping Use   Vaping Use: Some days   Substances: Nicotine, THC, Flavoring,  Mixture of cannabinoids  Substance and Sexual Activity   Alcohol use: Yes    Alcohol/week: 3.0 standard drinks of alcohol    Types: 3 Shots of liquor per week    Comment: every day   Drug use: Not Currently    Types: Marijuana    Comment: January   Sexual activity: Not on file  Other Topics Concern   Not on file  Social History Narrative   Not on file   Social Determinants of Health   Financial Resource Strain: Low Risk  (06/27/2021)   Overall Financial Resource Strain (CARDIA)    Difficulty of Paying Living Expenses: Not very hard  Food Insecurity: No Food Insecurity (06/27/2021)   Hunger Vital Sign    Worried About Running Out of Food in the Last Year: Never true    Ran Out of Food in the Last Year: Never true  Transportation Needs: Unmet Transportation  Needs (09/10/2021)   PRAPARE - Administrator, Civil Service (Medical): Yes    Lack of Transportation (Non-Medical): No  Physical Activity: Not on file  Stress: Not on file  Social Connections: Not on file  Intimate Partner Violence: Not on file    Physical Exam      No future appointments.     Beth Barnes, EMT-Paramedic [phone] [facility] Paramedic  12/19/21

## 2021-12-26 ENCOUNTER — Other Ambulatory Visit (HOSPITAL_COMMUNITY): Payer: Self-pay | Admitting: Emergency Medicine

## 2021-12-26 NOTE — Progress Notes (Signed)
Paramedicine Encounter    Patient ID: Beth Barnes, female    DOB: Oct 02, 1995, 26 y.o.   MRN: [id number]   There were no vitals taken for this visit. Weight yesterday-not taken Last visit weight-127.3lb  No chest pain or SOB. Refill called in for Entresto to be delivered today.  Med box reconciled x 2 weeks. Patient Care Team: Patient, No Pcp Per as PCP - General (General Practice)  Patient Active Problem List   Diagnosis Date Noted  . Seizure ([place]) 12/12/2021  . Alcohol use disorder 12/12/2021  . Hypokalemia 12/12/2021  . ETOH abuse 12/12/2021  . Heart failure ([place]) 05/20/2021  . Blindness and low vision 05/18/2021  . Cardiomyopathy, alcoholic ([place]) [id number]  . Acute systolic heart failure ([place])   . Cardiac arrest ([place]) 05/13/2021    Current Outpatient Medications:  .  acetaminophen (TYLENOL) 500 MG tablet, Take 1,000 mg by mouth every 6 (six) hours as needed for moderate pain or mild pain., Disp: , Rfl:  .  carvedilol (COREG) 3.125 MG tablet, Take 1 tablet (3.125 mg total) by mouth 2 (two) times daily., Disp: 180 tablet, Rfl: 3 .  dapagliflozin propanediol (FARXIGA) 10 MG TABS tablet, Take 1 tablet (10 mg total) by mouth daily., Disp: 90 tablet, Rfl: 3 .  folic acid (FOLVITE) 1 MG tablet, Take 1 tablet (1 mg total) by mouth daily., Disp: 90 tablet, Rfl: 3 .  furosemide (LASIX) 20 MG tablet, Take 1 tablet (20 mg total) by mouth daily as needed for fluid or edema., Disp: 90 tablet, Rfl: 3 .  levETIRAcetam (KEPPRA) 750 MG tablet, Take 1 tablet (750 mg total) by mouth 2 (two) times daily., Disp: 180 tablet, Rfl: 0 .  potassium chloride SA (KLOR-CON M) 20 MEQ tablet, Take 1 tablet (20 mEq total) by mouth 2 (two) times daily., Disp: 180 tablet, Rfl: 3 .  spironolactone (ALDACTONE) 25 MG tablet, Take 1 tablet (25 mg total) by mouth daily., Disp: 90 tablet, Rfl: 3 .  dorzolamide-timolol (COSOPT) 22.3-6.8 MG/ML ophthalmic solution, Place 1 drop into the left eye 2 (two) times daily.  (Patient not taking: Reported on 12/17/2021), Disp: , Rfl:  .  sacubitril-valsartan (ENTRESTO) 24-26 MG, Take 1 tablet by mouth 2 (two) times daily., Disp: 60 tablet, Rfl: 3 .  traZODone (DESYREL) 50 MG tablet, Take 100 mg by mouth at bedtime as needed., Disp: , Rfl:  Allergies  Allergen Reactions  . Cetirizine & Related Hives      Social History   Socioeconomic History  . Marital status: Significant Other    Spouse name: Not on file  . Number of children: 0  . Years of education: Not on file  . Highest education level: High school graduate  Occupational History    Comment: NO  Tobacco Use  . Smoking status: Some Days    Packs/day: 0.50    Years: 5.00    Total pack years: 2.50    Types: Cigarettes  . Smokeless tobacco: Never  . Tobacco comments:    Smoking cessation  Vaping Use  . Vaping Use: Some days  . Substances: Nicotine, THC, Flavoring, Mixture of cannabinoids  Substance and Sexual Activity  . Alcohol use: Yes    Alcohol/week: 3.0 standard drinks of alcohol    Types: 3 Shots of liquor per week    Comment: every day  . Drug use: Not Currently    Types: Marijuana    Comment: January  . Sexual activity: Not on file  Other Topics Concern  .  Not on file  Social History Narrative  . Not on file   Social Determinants of Health   Financial Resource Strain: Low Risk  (06/27/2021)   Overall Financial Resource Strain (CARDIA)   . Difficulty of Paying Living Expenses: Not very hard  Food Insecurity: No Food Insecurity (06/27/2021)   Hunger Vital Sign   . Worried About Programme researcher, broadcasting/film/video in the Last Year: Never true   . Ran Out of Food in the Last Year: Never true  Transportation Needs: Unmet Transportation Needs (09/10/2021)   PRAPARE - Transportation   . Lack of Transportation (Medical): Yes   . Lack of Transportation (Non-Medical): No  Physical Activity: Not on file  Stress: Not on file  Social Connections: Not on file  Intimate Partner Violence: Not on file     Physical Exam      No future appointments.     [name], EMT-P-Paramedic [phone] [facility] Paramedic  12/26/21

## 2022-01-10 ENCOUNTER — Emergency Department (HOSPITAL_COMMUNITY): Payer: Medicaid Other

## 2022-01-10 ENCOUNTER — Other Ambulatory Visit: Payer: Self-pay

## 2022-01-10 ENCOUNTER — Ambulatory Visit (HOSPITAL_COMMUNITY)
Admission: EM | Admit: 2022-01-10 | Discharge: 2022-01-10 | Disposition: A | Discharge status: Home / Self Care | Payer: Medicaid Other

## 2022-01-10 ENCOUNTER — Observation Stay (HOSPITAL_COMMUNITY)
Admission: EM | Admit: 2022-01-10 | Discharge: 2022-01-11 | Disposition: A | Discharge status: Home / Self Care | Payer: Medicaid Other | Attending: Family Medicine | Admitting: Family Medicine

## 2022-01-10 DIAGNOSIS — F1010 Alcohol abuse, uncomplicated: Secondary | ICD-10-CM | POA: Diagnosis present

## 2022-01-10 DIAGNOSIS — R109 Unspecified abdominal pain: Secondary | ICD-10-CM | POA: Diagnosis present

## 2022-01-10 DIAGNOSIS — E860 Dehydration: Secondary | ICD-10-CM | POA: Insufficient documentation

## 2022-01-10 DIAGNOSIS — H5410 Blindness, one eye, low vision other eye, unspecified eyes: Secondary | ICD-10-CM | POA: Diagnosis present

## 2022-01-10 DIAGNOSIS — H548 Legal blindness, as defined in USA: Secondary | ICD-10-CM | POA: Diagnosis present

## 2022-01-10 DIAGNOSIS — H547 Unspecified visual loss: Secondary | ICD-10-CM | POA: Diagnosis not present

## 2022-01-10 DIAGNOSIS — R569 Unspecified convulsions: Secondary | ICD-10-CM

## 2022-01-10 DIAGNOSIS — R112 Nausea with vomiting, unspecified: Secondary | ICD-10-CM

## 2022-01-10 DIAGNOSIS — Z79899 Other long term (current) drug therapy: Secondary | ICD-10-CM | POA: Diagnosis not present

## 2022-01-10 DIAGNOSIS — F1090 Alcohol use, unspecified, uncomplicated: Secondary | ICD-10-CM | POA: Insufficient documentation

## 2022-01-10 DIAGNOSIS — Z9109 Other allergy status, other than to drugs and biological substances: Secondary | ICD-10-CM

## 2022-01-10 DIAGNOSIS — Q150 Congenital glaucoma: Secondary | ICD-10-CM

## 2022-01-10 DIAGNOSIS — I5022 Chronic systolic (congestive) heart failure: Secondary | ICD-10-CM | POA: Insufficient documentation

## 2022-01-10 DIAGNOSIS — Z888 Allergy status to other drugs, medicaments and biological substances status: Secondary | ICD-10-CM

## 2022-01-10 DIAGNOSIS — T59891A Toxic effect of other specified gases, fumes and vapors, accidental (unintentional), initial encounter: Secondary | ICD-10-CM | POA: Diagnosis present

## 2022-01-10 DIAGNOSIS — K760 Fatty (change of) liver, not elsewhere classified: Secondary | ICD-10-CM | POA: Diagnosis present

## 2022-01-10 DIAGNOSIS — Z8674 Personal history of sudden cardiac arrest: Secondary | ICD-10-CM

## 2022-01-10 DIAGNOSIS — N179 Acute kidney failure, unspecified: Principal | ICD-10-CM | POA: Insufficient documentation

## 2022-01-10 DIAGNOSIS — G40909 Epilepsy, unspecified, not intractable, without status epilepticus: Secondary | ICD-10-CM | POA: Insufficient documentation

## 2022-01-10 DIAGNOSIS — I5021 Acute systolic (congestive) heart failure: Secondary | ICD-10-CM | POA: Diagnosis present

## 2022-01-10 DIAGNOSIS — Z1152 Encounter for screening for COVID-19: Secondary | ICD-10-CM | POA: Insufficient documentation

## 2022-01-10 DIAGNOSIS — I5023 Acute on chronic systolic (congestive) heart failure: Secondary | ICD-10-CM | POA: Diagnosis not present

## 2022-01-10 DIAGNOSIS — R197 Diarrhea, unspecified: Secondary | ICD-10-CM | POA: Diagnosis present

## 2022-01-10 DIAGNOSIS — Z7729 Contact with and (suspected ) exposure to other hazardous substances: Secondary | ICD-10-CM | POA: Insufficient documentation

## 2022-01-10 LAB — COMPREHENSIVE METABOLIC PANEL
ALT: 99 U/L — ABNORMAL HIGH (ref 0–44)
AST: 166 U/L — ABNORMAL HIGH (ref 15–41)
Albumin: 5.2 g/dL — ABNORMAL HIGH (ref 3.5–5.0)
Alkaline Phosphatase: 86 U/L (ref 38–126)
Anion gap: 22 — ABNORMAL HIGH (ref 5–15)
BUN: 30 mg/dL — ABNORMAL HIGH (ref 6–20)
CO2: 24 mmol/L (ref 22–32)
Calcium: 10.6 mg/dL — ABNORMAL HIGH (ref 8.9–10.3)
Chloride: 91 mmol/L — ABNORMAL LOW (ref 98–111)
Creatinine, Ser: 2.88 mg/dL — ABNORMAL HIGH (ref 0.44–1.00)
GFR, Estimated: 23 mL/min — ABNORMAL LOW (ref >60)
Glucose, Bld: 129 mg/dL — ABNORMAL HIGH (ref 70–99)
Potassium: 4.3 mmol/L (ref 3.5–5.1)
Sodium: 137 mmol/L (ref 135–145)
Total Bilirubin: 1.7 mg/dL — ABNORMAL HIGH (ref 0.3–1.2)
Total Protein: 9 g/dL — ABNORMAL HIGH (ref 6.5–8.1)

## 2022-01-10 LAB — CBC
HCT: 38.2 % (ref 36.0–46.0)
Hemoglobin: 12.7 g/dL (ref 12.0–15.0)
MCH: 28.5 pg (ref 26.0–34.0)
MCHC: 33.2 g/dL (ref 30.0–36.0)
MCV: 85.7 fL (ref 80.0–100.0)
Platelets: 342 10*3/uL (ref 150–400)
RBC: 4.46 MIL/uL (ref 3.87–5.11)
RDW: 21.4 % — ABNORMAL HIGH (ref 11.5–15.5)
WBC: 12.9 10*3/uL — ABNORMAL HIGH (ref 4.0–10.5)
nRBC: 0.3 % — ABNORMAL HIGH (ref 0.0–0.2)

## 2022-01-10 LAB — I-STAT BETA HCG BLOOD, ED (MC, WL, AP ONLY): I-stat hCG, quantitative: <5 m[IU]/mL (ref <5)

## 2022-01-10 LAB — RESP PANEL BY RT-PCR (FLU A&B, COVID) ARPGX2
Influenza A by PCR: NEGATIVE
Influenza B by PCR: NEGATIVE
SARS Coronavirus 2 by RT PCR: NEGATIVE

## 2022-01-10 LAB — COOXEMETRY PANEL
Carboxyhemoglobin: 1.7 % — ABNORMAL HIGH (ref 0.5–1.5)
Methemoglobin: 1.1 % (ref 0.0–1.5)
O2 Saturation: 94.1 %
Total hemoglobin: 12.4 g/dL (ref 12.0–16.0)

## 2022-01-10 MED ORDER — LACTATED RINGERS IV SOLN: INTRAVENOUS | Status: DC

## 2022-01-10 MED ORDER — DIPHENHYDRAMINE HCL 25 MG PO CAPS
25.0000 mg | ORAL_CAPSULE | Freq: Once | ORAL | Status: AC
  Administered 2022-01-10: 25 mg via ORAL
  Filled 2022-01-10: qty 1

## 2022-01-10 MED ORDER — LEVETIRACETAM IN NACL 1000 MG/100ML IV SOLN
1000.0000 mg | Freq: Once | INTRAVENOUS | Status: AC
  Administered 2022-01-10: 1000 mg via INTRAVENOUS
  Filled 2022-01-10: qty 100

## 2022-01-10 MED ORDER — LACTATED RINGERS IV BOLUS
1500.0000 mL | Freq: Once | INTRAVENOUS | Status: AC
  Administered 2022-01-10: 1500 mL via INTRAVENOUS

## 2022-01-10 MED ORDER — SODIUM CHLORIDE 0.9 % IV BOLUS: 1000.0000 mL | Freq: Once | INTRAVENOUS | Status: DC

## 2022-01-10 MED ORDER — METOCLOPRAMIDE HCL 5 MG/ML IJ SOLN
10.0000 mg | Freq: Once | INTRAMUSCULAR | Status: AC
  Administered 2022-01-10: 10 mg via INTRAVENOUS
  Filled 2022-01-10: qty 2
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
## 2022-01-10 NOTE — ED Notes (Signed)
Spoke to [place] at [facility].  ETA for truck may be one hour

## 2022-01-10 NOTE — ED Provider Notes (Signed)
[facility] EMERGENCY DEPARTMENT Provider Note   CSN: [id number] Arrival date & time: 01/10/22  1742     History {Add pertinent medical, surgical, social history, OB history to HPI:1} Chief Complaint  Patient presents with  . Toxic Inhalation    Beth Barnes is a 26 y.o. female.  HPI         Home Medications Prior to Admission medications   Medication Sig Start Date End Date Taking? Authorizing Provider  acetaminophen (TYLENOL) 500 MG tablet Take 1,000 mg by mouth every 6 (six) hours as needed for moderate pain or mild pain.    [provider]  carvedilol (COREG) 3.125 MG tablet Take 1 tablet (3.125 mg total) by mouth 2 (two) times daily. 11/11/21   Milford, [name], FNP  dapagliflozin propanediol (FARXIGA) 10 MG TABS tablet Take 1 tablet (10 mg total) by mouth daily. 11/11/21   Milford, [name], FNP  dorzolamide-timolol (COSOPT) 22.3-6.8 MG/ML ophthalmic solution Place 1 drop into the left eye 2 (two) times daily. Patient not taking: Reported on 12/17/2021 04/28/21   [provider]  folic acid (FOLVITE) 1 MG tablet Take 1 tablet (1 mg total) by mouth daily. 06/03/21   Raulkar, Drema Pry, MD  furosemide (LASIX) 20 MG tablet Take 1 tablet (20 mg total) by mouth daily as needed for fluid or edema. 11/11/21   Jacklynn Ganong, FNP  levETIRAcetam (KEPPRA) 750 MG tablet Take 1 tablet (750 mg total) by mouth 2 (two) times daily. 12/02/21 03/02/22  Windell Norfolk, MD  potassium chloride SA (KLOR-CON M) 20 MEQ tablet Take 1 tablet (20 mEq total) by mouth 2 (two) times daily. 11/11/21   Milford, [name], FNP  sacubitril-valsartan (ENTRESTO) 24-26 MG Take 1 tablet by mouth 2 (two) times daily. 08/19/21   Bensimhon, Bevelyn Buckles, MD  spironolactone (ALDACTONE) 25 MG tablet Take 1 tablet (25 mg total) by mouth daily. 11/11/21   Milford, [name], FNP  traZODone (DESYREL) 50 MG tablet Take 100 mg by mouth at bedtime as needed. 10/16/21   [provider]      Allergies    Cetirizine & related    Review of Systems   Review of Systems  Physical Exam Updated Vital Signs BP (!) 129/100 (BP Location: Right Arm)   Pulse (!) 110   Temp 98.2 F (36.8 C)   Resp 20   SpO2 100%  Physical Exam  ED Results / Procedures / Treatments   Labs (all labs ordered are listed, but only abnormal results are displayed) Labs Reviewed  CBC - Abnormal; Notable for the following components:      Result Value   WBC 12.9 (*)    RDW 21.4 (*)    nRBC 0.3 (*)    All other components within normal limits  COMPREHENSIVE METABOLIC PANEL - Abnormal; Notable for the following components:   Chloride 91 (*)    Glucose, Bld 129 (*)    BUN 30 (*)    Creatinine, Ser 2.88 (*)    Calcium 10.6 (*)    Total Protein 9.0 (*)    Albumin 5.2 (*)    AST 166 (*)    ALT 99 (*)    Total Bilirubin 1.7 (*)    GFR, Estimated 23 (*)    Anion gap 22 (*)    All other components within normal limits  COOXEMETRY PANEL    EKG None  Radiology No results found.  Procedures Procedures  {Document cardiac monitor, telemetry assessment  procedure when appropriate:1}  Medications Ordered in ED Medications - No data to display  ED Course/ Medical Decision Making/ A&P Clinical Course as of 01/10/22 2104  Sat Jan 10, 2022  2102 Plan to give Keppra 1g Hydrate CT AP WO [WF]    Clinical Course User Index [WF] Gailen Shelter, [place]                           Medical Decision Making  ***  {Document critical care time when appropriate:1} {Document review of labs and clinical decision tools ie heart score, Chads2Vasc2 etc:1}  {Document your independent review of radiology images, and any outside records:1} {Document your discussion with family members, caretakers, and with consultants:1} {Document social determinants of health affecting pt's care:1} {Document your decision making why or why not admission, treatments were needed:1} Final Clinical Impression(s) / ED  Diagnoses Final diagnoses:  None    Rx / DC Orders ED Discharge Orders     None

## 2022-01-10 NOTE — ED Notes (Signed)
Called and cancelled the [facility] transport.
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
## 2022-01-11 DIAGNOSIS — R112 Nausea with vomiting, unspecified: Secondary | ICD-10-CM

## 2022-01-11 DIAGNOSIS — Z7729 Contact with and (suspected ) exposure to other hazardous substances: Secondary | ICD-10-CM | POA: Insufficient documentation

## 2022-01-11 DIAGNOSIS — E860 Dehydration: Secondary | ICD-10-CM

## 2022-01-11 DIAGNOSIS — N179 Acute kidney failure, unspecified: Secondary | ICD-10-CM | POA: Diagnosis not present

## 2022-01-11 LAB — URINALYSIS, ROUTINE W REFLEX MICROSCOPIC
Bilirubin Urine: NEGATIVE
Glucose, UA: >=500 mg/dL — AB
Hgb urine dipstick: NEGATIVE
Ketones, ur: NEGATIVE mg/dL
Leukocytes,Ua: NEGATIVE
Nitrite: NEGATIVE
Protein, ur: 100 mg/dL — AB
Specific Gravity, Urine: 1.018 (ref 1.005–1.030)
pH: 5 (ref 5.0–8.0)

## 2022-01-11 LAB — CBC
HCT: 30 % — ABNORMAL LOW (ref 36.0–46.0)
Hemoglobin: 9.8 g/dL — ABNORMAL LOW (ref 12.0–15.0)
MCH: 27.9 pg (ref 26.0–34.0)
MCHC: 32.7 g/dL (ref 30.0–36.0)
MCV: 85.5 fL (ref 80.0–100.0)
Platelets: 149 10*3/uL — ABNORMAL LOW (ref 150–400)
RBC: 3.51 MIL/uL — ABNORMAL LOW (ref 3.87–5.11)
RDW: 21.2 % — ABNORMAL HIGH (ref 11.5–15.5)
WBC: 10.3 10*3/uL (ref 4.0–10.5)
nRBC: 0.2 % (ref 0.0–0.2)

## 2022-01-11 LAB — BASIC METABOLIC PANEL
Anion gap: 14 (ref 5–15)
BUN: 29 mg/dL — ABNORMAL HIGH (ref 6–20)
CO2: 23 mmol/L (ref 22–32)
Calcium: 9.1 mg/dL (ref 8.9–10.3)
Chloride: 96 mmol/L — ABNORMAL LOW (ref 98–111)
Creatinine, Ser: 2.22 mg/dL — ABNORMAL HIGH (ref 0.44–1.00)
GFR, Estimated: 31 mL/min — ABNORMAL LOW (ref >60)
Glucose, Bld: 115 mg/dL — ABNORMAL HIGH (ref 70–99)
Potassium: 3.8 mmol/L (ref 3.5–5.1)
Sodium: 133 mmol/L — ABNORMAL LOW (ref 135–145)

## 2022-01-11 LAB — FOLATE: Folate: 6.3 ng/mL (ref >5.9)

## 2022-01-11 MED ORDER — LEVETIRACETAM 250 MG PO TABS
750.0000 mg | ORAL_TABLET | Freq: Two times a day (BID) | ORAL | Status: DC
  Administered 2022-01-11: 750 mg via ORAL
  Filled 2022-01-11: qty 3

## 2022-01-11 MED ORDER — TRAZODONE HCL 50 MG PO TABS: 100.0000 mg | ORAL_TABLET | Freq: Every evening | ORAL | Status: DC | PRN

## 2022-01-11 MED ORDER — CARVEDILOL 3.125 MG PO TABS
3.1250 mg | ORAL_TABLET | Freq: Two times a day (BID) | ORAL | Status: DC
  Administered 2022-01-11: 3.125 mg via ORAL
  Filled 2022-01-11: qty 1

## 2022-01-11 MED ORDER — ENOXAPARIN SODIUM 40 MG/0.4ML IJ SOSY: 40.0000 mg | PREFILLED_SYRINGE | INTRAMUSCULAR | Status: DC

## 2022-01-11 MED ORDER — LEVETIRACETAM 500 MG PO TABS: 750.0000 mg | ORAL_TABLET | Freq: Two times a day (BID) | ORAL | Status: DC

## 2022-01-11 MED ORDER — ENOXAPARIN SODIUM 40 MG/0.4ML IJ SOSY
40.0000 mg | PREFILLED_SYRINGE | INTRAMUSCULAR | Status: DC
  Administered 2022-01-11: 40 mg via SUBCUTANEOUS
  Filled 2022-01-11: qty 0.4

## 2022-01-11 NOTE — ED Notes (Signed)
Breakfast tray provided to patient.

## 2022-01-11 NOTE — Assessment & Plan Note (Addendum)
Patient w/ hx of HF. Last echo 12/12/21 with EF 60-65%. Euvolemic on exam and no complaints of SOB. Patient does not appear to be in an exacerbation at this time. Will hold home meds in setting of AKI, volume depletion, and low normotensive BP. Patient euvolemic on exam, w/ no signs/symptoms of HF exacerbation. -Strict I/O -Hold home meds in setting of AKI  Entresto  Spironolactone  Coreg  Lasix

## 2022-01-11 NOTE — ED Notes (Signed)
Attempted to straight stick pt x2 for CBC and unsuccessful
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
## 2022-01-11 NOTE — Assessment & Plan Note (Addendum)
Patient w/ hx of seizure disorder, ok Keppra, but was unable to take meds last few days, 2/2 N/V. S/p Keppra load in ED. -Keppra 750 mg BID -Seizure precautions -Neuro checks q4h

## 2022-01-11 NOTE — Care Management (Signed)
PCP and SA resources added to AVS/Discharge instructions

## 2022-01-11 NOTE — ED Notes (Signed)
Md at bedside
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
## 2022-01-11 NOTE — ED Notes (Signed)
Patient verbalizes understanding of discharge instructions. Opportunity for questioning and answers were provided. Armband removed by staff, pt discharged from ED. Pt taken to ED entrance via wheel chair.  

## 2022-01-11 NOTE — Assessment & Plan Note (Addendum)
Patient with hx of congential blindness

## 2022-01-11 NOTE — Assessment & Plan Note (Addendum)
Patient with hx of EtOH abuse, reports drinking Vodka. Patient reports drinking nearly half a bottle of vodka in a sitting. Last drink was on Monday, patient unsure about how much she took. Patient w/o tremulousness or confusion at this time, will continue to monitor. Patient also has transaminitis which is chronic problem.  -CIWA -TOC EtOH abuse

## 2022-01-11 NOTE — Discharge Instructions (Addendum)
Dear Beth Barnes,   Thank you for letting us participate in your care! In this section, you will find a brief hospital admission summary of why you were admitted to the hospital, what happened during your admission, your diagnosis/diagnoses, and recommended follow up.  Primary diagnosis: Acute kidney injury Treatment plan: You received fluids and medications to stop your nausea and vomiting briefly.  Your kidneys showed improvement with the fluids you received although this should be checked again at your follow-up on 01/13/2022 in our clinic.  POST-HOSPITAL & CARE INSTRUCTIONS Please also follow up in our clinic with Dr. Barb Merino seen below  DOCTOR'S APPOINTMENTS & FOLLOW UP Future Appointments  Date Time Provider Department Center  01/13/2022  1:30 PM Vonna Drafts, MD [facility]   Thank you for choosing [facility]! Take care and be well!  Family Medicine Teaching Service Inpatient Team [place]  [facility]  [address] [place], [place] [address] [phone]                      Intensive Outpatient Programs  [facility]    [facility] [address]     [address] [place],  [place]     [place], [place] [phone]      [phone]  [facility]   [facility]  (Inpatient and outpatient)  [phone] (Suboxone and Methadone) [address]           [phone]           [facility]: [facility]    [facility] - Intensive Outpatient [address]     [address] [place], [place] [address]     [place], [place]  [phone]      [phone]  [facility] (Outpatient, Inpatient, [facility]  [facility] (Groups and Residental) (insurance only) [phone]    [place], [place]          [phone]       [facility]    [facility] (for caregivers and family) [address]     [address]  [address] [place], [place]     [place], [place] [phone]      [phone]  Residential Treatment Programs  [facility]  [place](2 years) Residential: 90 days)  [facility] ([facility]      [address] [place], [place]     [place], [place] [phone]      [phone] or [phone]  [facility]    [facility] [address]      [address] [place], [place]     [place], [place] [phone]      [phone]  [facility]   [facility] Services (RTS) [address]     [address] [place], [place] [address]     [place], [place] [phone]      [phone] Admissions: 8am-3pm M-F  [facility] Program: Residential Program [phone] Days)              [facility]: [facility]  [place], [place]     [place], [place]  [phone] or [phone]    (Walk in Hours over the weekend or by referral)   [facility] Crisis: [facility]:[phone] (for crisis response 24 hours a day

## 2022-01-13 ENCOUNTER — Inpatient Hospital Stay: Payer: Self-pay | Admitting: Family Medicine

## 2022-01-13 ENCOUNTER — Other Ambulatory Visit (HOSPITAL_COMMUNITY): Payer: Self-pay | Admitting: Emergency Medicine

## 2022-01-13 LAB — LEVETIRACETAM LEVEL: Levetiracetam Lvl: <2 ug/mL — ABNORMAL LOW (ref 10.0–40.0)
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
## 2022-01-17 LAB — VITAMIN B1: Vitamin B1 (Thiamine): 43.7 nmol/L — ABNORMAL LOW (ref 66.5–200.0)

## 2022-01-19 ENCOUNTER — Inpatient Hospital Stay: Payer: Medicaid Other | Admitting: Family Medicine
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
## 2022-01-19 NOTE — Progress Notes (Incomplete)
PCP: Patient, No Pcp Per HF Cardiology: Dr. Shirlee Latch  HPI:  26 y.o. female was referred from [facility] clinic to CHF clinic for ongoing management of HF.  She has congenital blindness and h/o HTN as well as significant ETOH abuse.  She suffered witnessed cardiac arrest in 4/23 while visiting her boyfriend who was hospitalized at [facility]. RNs on the floor attended to her immediately. She was found to be in ventricular fibrillation.  She required epinephrine, cardioversion x 2, sodium bicarbonate.  She was treated with Narcan. Achievement of ROSC after being down for ~10 min. She was intubated and admitted to ICU. Had seizures post arrest and treated w/ Keppra. Transferred for [facility] for further management.  Labs showed hypokalemia and hypomagnesemia and EKG w/ prolonged QT. No acute STE. Electrolyte abnormalties were corrected. Hs-troponin 280>>1,223>>3,101 but felt likely demand ischemia and not ACS.  Of note, Boyfriend reported the pt drinks at least 1/5 of a gallon of liquor daily, also reported in another note by CCM hx of cocaine/marijuana as well (TOX + for benzos only, no ETOH level was done).  Initial Echo 05/14/21 showed severe biventricular failure, LVEF < 20%, RV moderately reduced.  Repeat limited echo 05/19/21 showed interval improvement, w/ LVEF up to 30-35%.  cMRI 05/20/21 showed mild septal hypertrophy 14 mm with mild global hypokinesis EF 42%. There was nonspecific mid myocardial gadolinium uptake on delayed enhancement sequences but no evidence of myocarditis or infiltration. Normal RV size and function. Normal cardiac valves.   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 She was evaluated by EP, ICD not indicated as cause of arrest thought to be reversible.   Once stabilized, she was placed on GDMT w/ Entresto, Jardiance, Aldactone and Coreg. OCP recommended. Referral  placed to GYN to get on OCPs, no showed appointment 05/23. She initially was not compliant with medication regimen likely due to difficulty managing her meds (blind).  She now has paramedicine and is much more compliant with meds.   Echo 8/23 showed EF 55-60%, normal RV.  Today she returns for HF follow up here with [place] with paramedicine. Overall feeling fine. She does not have dyspnea with activity. Denies palpitations, abnormal bleeding, CP, dizziness, edema, or PND/Orthopnea. Appetite ok. No fever or chills. Weight at home 130 pounds. Taking all medications. She has cut back on ETOH. Vapes occasionally. Occasional THC use. Moving back to [place] next month to live closer to her mother [place] area).   Labs (5/23): BNP 6.5, creatinine 4.2, creatinine 0.87 Labs (8/23): K 3.5, creatinine 0.74  PMH: 1. HTN 2. ETOH abuse 3. Congenital blindness 4. Chronic systolic CHF: Echo (05/14/21) with EF < 20%, moderate RV dysfunction.  - Echo (05/19/21): EF 30-35%.  - Cardiac MRI (05/20/21): LV EF 42%, normal RV EF, nonspecific mid-myocardial LGE.  5. Depression 6. Prior cocaine abuse.  7. VF in setting of prolonged QTc and hypokalemia/hypomagnesemia.   Review of Systems: Cardiac and respiratory - Negative except as mentioned in HPI   Past Medical History:  Diagnosis Date   Blind    Congenital glaucoma of both eyes    Hypokalemia     Current Outpatient Medications  Medication Sig Dispense Refill   acetaminophen (TYLENOL) 500 MG tablet Take 1,000 mg by mouth every 6 (six) hours as needed for moderate pain or mild pain.     dorzolamide-timolol (COSOPT) 22.3-6.8 MG/ML ophthalmic solution Place  1 drop into the left eye 2 (two) times daily. (Patient not taking: Reported on 01/13/2022)     folic acid (FOLVITE) 1 MG tablet Take 1 tablet (1 mg total) by mouth daily. 90 tablet 3   levETIRAcetam (KEPPRA) 750 MG tablet Take 1 tablet (750 mg total) by mouth 2 (two) times daily. 180 tablet 0   traZODone  (DESYREL) 50 MG tablet Take 100 mg by mouth at bedtime as needed.     No current facility-administered medications for this visit.    Allergies  Allergen Reactions   Cetirizine & Related Hives   Other     Pine trees; reaction unk.   Social History   Socioeconomic History   Marital status: Significant Other    Spouse name: Not on file   Number of children: 0   Years of education: Not on file   Highest education level: High school graduate  Occupational History    Comment: NO  Tobacco Use   Smoking status: Some Days    Packs/day: 0.50    Years: 5.00    Total pack years: 2.50    Types: Cigarettes   Smokeless tobacco: Never   Tobacco comments:    Smoking cessation  Vaping Use   Vaping Use: Some days   Substances: Nicotine, THC, Flavoring, Mixture of cannabinoids  Substance and Sexual Activity   Alcohol use: Yes    Alcohol/week: 3.0 standard drinks of alcohol    Types: 3 Shots of liquor per week    Comment: every day   Drug use: Not Currently    Types: Marijuana    Comment: January   Sexual activity: Not on file  Other Topics Concern   Not on file  Social History Narrative   Not on file   Social Determinants of Health   Financial Resource Strain: Low Risk  (06/27/2021)   Overall Financial Resource Strain (CARDIA)    Difficulty of Paying Living Expenses: Not very hard  Food Insecurity: No Food Insecurity (06/27/2021)   Hunger Vital Sign    Worried About Running Out of Food in the Last Year: Never true    Ran Out of Food in the Last Year: Never true  Transportation Needs: Unmet Transportation Needs (09/10/2021)   PRAPARE - Administrator, Civil Service (Medical): Yes    Lack of Transportation (Non-Medical): No  Physical Activity: Not on file  Stress: Not on file  Social Connections: Not on file  Intimate Partner Violence: Not on file   Family History  Problem Relation Age of Onset   Healthy Mother    Healthy Father    There were no vitals taken  for this visit.  Wt Readings from Last 3 Encounters:  01/13/22 56.2 kg (123 lb 12.8 oz)  01/11/22 49.9 kg (110 lb)  12/26/21 56 kg (123 lb 6.4 oz)   PHYSICAL EXAM: General:  NAD. No resp difficulty HEENT: Blind Neck: Supple. No JVD. Carotids 2+ bilat; no bruits. No lymphadenopathy or thryomegaly appreciated. Cor: PMI nondisplaced. Regular rate & rhythm. No rubs, gallops or murmurs. Lungs: Clear Abdomen: Soft, nontender, nondistended. No hepatosplenomegaly. No bruits or masses. Good bowel sounds. Extremities: No cyanosis, clubbing, rash, edema Neuro: Alert & oriented x 3, cranial nerves grossly intact. Moves all 4 extremities w/o difficulty. Affect pleasant.  ASSESSMENT & PLAN: 1. VF arrest (4/23): Immediate bystander CPR. Full neurological recovery.  In setting of prolonged QT from hypokalemia/hypomagnesemia from chronic heavy ETOH use  - per EP, no indication for ICD.  -  Continue beta blocker. 2. Chronic systolic CHF: VF arrest 4/23. LVEF on echo post arrest <20%, RV moderately reduced.  Repeat limited echo several days post arrest w/ interval improvement, EF up to 30-35%.  cMRI later in 4/23 w/ continued improvement, EF 42%; there was a small amount of nonspecific mid-myocardial LGE.  Cause of cardiomyopathy could be heavy ETOH abuse.  However, with improvement over serial studies, she may have had myocardial stunning from the arrest. CAD unlikely with age and no premature family history.  Echo 8/23 showed EF improved to 55-60%. NYHA class I, she is euvolemic on exam. She is doing better with medication compliance with help of paramedicine, appreciate their assistance.  - Continue Farxiga 10 mg daily - Continue spironolactone 25 mg daily. BMET today - Continue Entresto 24/26 bid.  - Continue Coreg 3.125 mg bid.  - Limit ETOH. - Reiterated importance of birth control given fetotoxic meds.  She says she will find OBGYN when she moves next month. Not currently on contraception and not  planning on sexual activity. 3. Hypertension: BP now controlled. No medication changes. 4. ETOH Abuse: She has cut back a lot. Congratulated.  She is planning on moving to [place] next month. Will need to establish with Cardiology office. We will be happy to see her back on a PRN basis.  Prince Rome, FNP-BC 01/19/22

## 2022-01-20 ENCOUNTER — Emergency Department (HOSPITAL_COMMUNITY)
Admission: EM | Admit: 2022-01-20 | Discharge: 2022-01-20 | Discharge status: Against Medical Advice | Payer: Medicaid Other | Attending: Physician Assistant | Admitting: Physician Assistant

## 2022-01-20 ENCOUNTER — Other Ambulatory Visit: Payer: Self-pay

## 2022-01-20 ENCOUNTER — Encounter (HOSPITAL_COMMUNITY): Payer: Self-pay

## 2022-01-20 DIAGNOSIS — Z5321 Procedure and treatment not carried out due to patient leaving prior to being seen by health care provider: Secondary | ICD-10-CM | POA: Diagnosis not present

## 2022-01-20 DIAGNOSIS — R112 Nausea with vomiting, unspecified: Secondary | ICD-10-CM | POA: Insufficient documentation

## 2022-01-20 DIAGNOSIS — R6883 Chills (without fever): Secondary | ICD-10-CM | POA: Diagnosis not present

## 2022-01-20 LAB — HCG, SERUM, QUALITATIVE: Preg, Serum: NEGATIVE

## 2022-01-20 LAB — URINALYSIS, ROUTINE W REFLEX MICROSCOPIC
Bacteria, UA: NONE SEEN
Glucose, UA: NEGATIVE mg/dL
Hgb urine dipstick: NEGATIVE
Ketones, ur: 5 mg/dL — AB
Leukocytes,Ua: NEGATIVE
Nitrite: NEGATIVE
Protein, ur: 100 mg/dL — AB
Specific Gravity, Urine: 1.03 (ref 1.005–1.030)
pH: 5 (ref 5.0–8.0)

## 2022-01-20 LAB — CBC WITH DIFFERENTIAL/PLATELET
Abs Immature Granulocytes: 0 10*3/uL (ref 0.00–0.07)
Basophils Absolute: 0.1 10*3/uL (ref 0.0–0.1)
Basophils Relative: 1 %
Eosinophils Absolute: 0 10*3/uL (ref 0.0–0.5)
Eosinophils Relative: 0 %
HCT: 37 % (ref 36.0–46.0)
Hemoglobin: 11.2 g/dL — ABNORMAL LOW (ref 12.0–15.0)
Lymphocytes Relative: 4 %
Lymphs Abs: 0.3 10*3/uL — ABNORMAL LOW (ref 0.7–4.0)
MCH: 28.4 pg (ref 26.0–34.0)
MCHC: 30.3 g/dL (ref 30.0–36.0)
MCV: 93.9 fL (ref 80.0–100.0)
Monocytes Absolute: 0.3 10*3/uL (ref 0.1–1.0)
Monocytes Relative: 4 %
Neutro Abs: 6.7 10*3/uL (ref 1.7–7.7)
Neutrophils Relative %: 91 %
Platelets: 416 10*3/uL — ABNORMAL HIGH (ref 150–400)
RBC: 3.94 MIL/uL (ref 3.87–5.11)
RDW: 21.6 % — ABNORMAL HIGH (ref 11.5–15.5)
WBC: 7.4 10*3/uL (ref 4.0–10.5)
nRBC: 0 % (ref 0.0–0.2)
nRBC: 0 /100 WBC

## 2022-01-20 NOTE — ED Notes (Signed)
Registration notified staff that the patient left

## 2022-01-20 NOTE — ED Provider Triage Note (Signed)
Emergency Medicine Provider Triage Evaluation Note  Beth Barnes , a 26 y.o. female  was evaluated in triage.  Pt complains of nausea, vomiting, chills which began 4 days ago.  Patient has taken some Zofran without much improvement in symptoms.  She has not recorded a fever in the past.  Last menstrual period is unknown.  No urinary symptoms.  Review of Systems  Positive: Nausea, vomiting, chills Negative: Fever, urinary symptoms  Physical Exam  There were no vitals taken for this visit. Gen:   Awake, no distress   Resp:  Normal effort  MSK:   Moves extremities without difficulty  Other:    Medical Decision Making  Medically screening exam initiated at 12:50 PM.  Appropriate orders placed.  Beth Barnes was informed that the remainder of the evaluation will be completed by another provider, this initial triage assessment does not replace that evaluation, and the importance of remaining in the ED until their evaluation is complete.     Beth Manges, PA-C 01/20/22 1304

## 2022-01-20 NOTE — ED Triage Notes (Signed)
Pt arrived POV from home c/o generalized body aches, N/V unable to keep anything down. Pt also states she is having chills. Pt states theses symptoms started on Sunday.

## 2022-01-21 ENCOUNTER — Encounter (HOSPITAL_COMMUNITY): Payer: Medicaid Other

## 2022-01-27 ENCOUNTER — Other Ambulatory Visit: Payer: Self-pay

## 2022-01-27 ENCOUNTER — Emergency Department (HOSPITAL_BASED_OUTPATIENT_CLINIC_OR_DEPARTMENT_OTHER): Payer: Medicaid Other

## 2022-01-27 ENCOUNTER — Encounter (HOSPITAL_BASED_OUTPATIENT_CLINIC_OR_DEPARTMENT_OTHER): Payer: Self-pay

## 2022-01-27 ENCOUNTER — Inpatient Hospital Stay (HOSPITAL_BASED_OUTPATIENT_CLINIC_OR_DEPARTMENT_OTHER)
Admission: EM | Admit: 2022-01-27 | Discharge: 2022-01-29 | DRG: 948 | Disposition: A | Discharge status: Home / Self Care | Payer: Medicaid Other | Attending: Family Medicine | Admitting: Family Medicine

## 2022-01-27 DIAGNOSIS — Q150 Congenital glaucoma: Secondary | ICD-10-CM

## 2022-01-27 DIAGNOSIS — Z87898 Personal history of other specified conditions: Secondary | ICD-10-CM

## 2022-01-27 DIAGNOSIS — R7401 Elevation of levels of liver transaminase levels: Principal | ICD-10-CM | POA: Diagnosis present

## 2022-01-27 DIAGNOSIS — Y900 Blood alcohol level of less than 20 mg/100 ml: Secondary | ICD-10-CM | POA: Diagnosis present

## 2022-01-27 DIAGNOSIS — E860 Dehydration: Secondary | ICD-10-CM | POA: Diagnosis present

## 2022-01-27 DIAGNOSIS — R569 Unspecified convulsions: Secondary | ICD-10-CM | POA: Diagnosis present

## 2022-01-27 DIAGNOSIS — E876 Hypokalemia: Secondary | ICD-10-CM | POA: Diagnosis present

## 2022-01-27 DIAGNOSIS — R739 Hyperglycemia, unspecified: Secondary | ICD-10-CM | POA: Diagnosis present

## 2022-01-27 DIAGNOSIS — R112 Nausea with vomiting, unspecified: Principal | ICD-10-CM | POA: Diagnosis present

## 2022-01-27 DIAGNOSIS — R9431 Abnormal electrocardiogram [ECG] [EKG]: Secondary | ICD-10-CM | POA: Insufficient documentation

## 2022-01-27 DIAGNOSIS — F1010 Alcohol abuse, uncomplicated: Secondary | ICD-10-CM | POA: Diagnosis present

## 2022-01-27 DIAGNOSIS — D638 Anemia in other chronic diseases classified elsewhere: Secondary | ICD-10-CM | POA: Diagnosis present

## 2022-01-27 DIAGNOSIS — Z8674 Personal history of sudden cardiac arrest: Secondary | ICD-10-CM

## 2022-01-27 DIAGNOSIS — F17210 Nicotine dependence, cigarettes, uncomplicated: Secondary | ICD-10-CM | POA: Diagnosis present

## 2022-01-27 DIAGNOSIS — R824 Acetonuria: Secondary | ICD-10-CM | POA: Diagnosis present

## 2022-01-27 DIAGNOSIS — E872 Acidosis, unspecified: Secondary | ICD-10-CM | POA: Diagnosis present

## 2022-01-27 DIAGNOSIS — I5022 Chronic systolic (congestive) heart failure: Secondary | ICD-10-CM | POA: Diagnosis present

## 2022-01-27 DIAGNOSIS — F1090 Alcohol use, unspecified, uncomplicated: Secondary | ICD-10-CM

## 2022-01-27 DIAGNOSIS — K760 Fatty (change of) liver, not elsewhere classified: Secondary | ICD-10-CM | POA: Diagnosis present

## 2022-01-27 DIAGNOSIS — H548 Legal blindness, as defined in USA: Secondary | ICD-10-CM | POA: Diagnosis present

## 2022-01-27 DIAGNOSIS — N179 Acute kidney failure, unspecified: Secondary | ICD-10-CM | POA: Diagnosis present

## 2022-01-27 DIAGNOSIS — Z888 Allergy status to other drugs, medicaments and biological substances status: Secondary | ICD-10-CM

## 2022-01-27 DIAGNOSIS — Z720 Tobacco use: Secondary | ICD-10-CM | POA: Diagnosis present

## 2022-01-27 DIAGNOSIS — I5021 Acute systolic (congestive) heart failure: Secondary | ICD-10-CM | POA: Diagnosis present

## 2022-01-27 DIAGNOSIS — E8729 Other acidosis: Secondary | ICD-10-CM | POA: Diagnosis present

## 2022-01-27 DIAGNOSIS — Z79899 Other long term (current) drug therapy: Secondary | ICD-10-CM

## 2022-01-27 HISTORY — DX: Unspecified convulsions: R56.9

## 2022-01-27 LAB — COMPREHENSIVE METABOLIC PANEL
ALT: 184 U/L — ABNORMAL HIGH (ref 0–44)
AST: 329 U/L — ABNORMAL HIGH (ref 15–41)
Albumin: 4.8 g/dL (ref 3.5–5.0)
Alkaline Phosphatase: 75 U/L (ref 38–126)
Anion gap: 34 — ABNORMAL HIGH (ref 5–15)
BUN: 10 mg/dL (ref 6–20)
CO2: 18 mmol/L — ABNORMAL LOW (ref 22–32)
Calcium: 9.7 mg/dL (ref 8.9–10.3)
Chloride: 93 mmol/L — ABNORMAL LOW (ref 98–111)
Creatinine, Ser: 1.21 mg/dL — ABNORMAL HIGH (ref 0.44–1.00)
GFR, Estimated: >60 mL/min (ref >60)
Glucose, Bld: 73 mg/dL (ref 70–99)
Potassium: 2.9 mmol/L — ABNORMAL LOW (ref 3.5–5.1)
Sodium: 145 mmol/L (ref 135–145)
Total Bilirubin: 2 mg/dL — ABNORMAL HIGH (ref 0.3–1.2)
Total Protein: 8.5 g/dL — ABNORMAL HIGH (ref 6.5–8.1)

## 2022-01-27 LAB — HCG, SERUM, QUALITATIVE: Preg, Serum: NEGATIVE

## 2022-01-27 LAB — CBC
HCT: 35.3 % — ABNORMAL LOW (ref 36.0–46.0)
Hemoglobin: 11.4 g/dL — ABNORMAL LOW (ref 12.0–15.0)
MCH: 28.2 pg (ref 26.0–34.0)
MCHC: 32.3 g/dL (ref 30.0–36.0)
MCV: 87.4 fL (ref 80.0–100.0)
Platelets: 406 10*3/uL — ABNORMAL HIGH (ref 150–400)
RBC: 4.04 MIL/uL (ref 3.87–5.11)
RDW: 22.1 % — ABNORMAL HIGH (ref 11.5–15.5)
WBC: 12.6 10*3/uL — ABNORMAL HIGH (ref 4.0–10.5)
nRBC: 0 % (ref 0.0–0.2)

## 2022-01-27 LAB — LIPASE, BLOOD: Lipase: 25 U/L (ref 11–51)

## 2022-01-27 MED ORDER — ONDANSETRON HCL 4 MG/2ML IJ SOLN
4.0000 mg | Freq: Once | INTRAMUSCULAR | Status: AC
  Administered 2022-01-27: 4 mg via INTRAVENOUS
  Filled 2022-01-27: qty 2

## 2022-01-27 MED ORDER — LACTATED RINGERS IV BOLUS
1000.0000 mL | Freq: Once | INTRAVENOUS | Status: AC
  Administered 2022-01-27: 1000 mL via INTRAVENOUS

## 2022-01-27 MED ORDER — METOCLOPRAMIDE HCL 5 MG/ML IJ SOLN
INTRAMUSCULAR | Status: AC
  Administered 2022-01-27: 20 mg
  Filled 2022-01-27: qty 4

## 2022-01-27 MED ORDER — METOCLOPRAMIDE HCL 5 MG/ML IJ SOLN
20.0000 mg | Freq: Once | INTRAVENOUS | Status: DC
  Filled 2022-01-27: qty 4

## 2022-01-27 NOTE — ED Provider Notes (Addendum)
 [facility] EMERGENCY DEPT Provider Note   CSN: [id number] Arrival date & time: 01/27/22  1556     History Chief Complaint  Patient presents with   Emesis    Beth Barnes is a 26 y.o. female.  HPI Patient presents with complaints of 2 weeks of nausea and vomiting. She previously attempted to be seen at another ER but left prior to treatment. She states that she is struggling to keep and food or liquids down even when taking Zofran at home. She states that she previously had issues with alcohol use but denied any alcohol use in several weeks. Denies fever, chest pain, shortness of breath, swelling, headaches, or swelling in extremities.    Home Medications Prior to Admission medications   Medication Sig Start Date End Date Taking? Authorizing Provider  acetaminophen (TYLENOL) 500 MG tablet Take 1,000 mg by mouth every 6 (six) hours as needed for moderate pain or mild pain.    [provider]  dorzolamide-timolol (COSOPT) 22.3-6.8 MG/ML ophthalmic solution Place 1 drop into the left eye 2 (two) times daily. Patient not taking: Reported on 01/13/2022 04/28/21   [provider]  folic acid (FOLVITE) 1 MG tablet Take 1 tablet (1 mg total) by mouth daily. 06/03/21   Raulkar, Drema Pry, MD  levETIRAcetam (KEPPRA) 750 MG tablet Take 1 tablet (750 mg total) by mouth 2 (two) times daily. 12/02/21 03/02/22  Windell Norfolk, MD  traZODone (DESYREL) 50 MG tablet Take 100 mg by mouth at bedtime as needed. 10/16/21   [provider]      Allergies    Cetirizine & related and Other    Review of Systems   Review of Systems  Gastrointestinal:  Positive for nausea and vomiting.  Genitourinary:  Positive for flank pain. Negative for dysuria and urgency.  Musculoskeletal:  Positive for back pain.  All other systems reviewed and are negative.   Physical Exam Updated Vital Signs BP 114/69   Pulse 76   Temp 97.7 F (36.5 C) (Oral)   Resp 17   Ht 5\' 3"  (1.6  m)   Wt 56.2 kg   SpO2 100%   BMI 21.95 kg/m  Physical Exam Vitals and nursing note reviewed.  Constitutional:      Appearance: Normal appearance.  HENT:     Head: Normocephalic and atraumatic.  Cardiovascular:     Rate and Rhythm: Normal rate and regular rhythm.     Pulses: Normal pulses.     Heart sounds: Normal heart sounds.  Pulmonary:     Effort: Pulmonary effort is normal.     Breath sounds: Normal breath sounds.  Abdominal:     General: Abdomen is flat. There is no distension.     Tenderness: There is no abdominal tenderness. There is right CVA tenderness and left CVA tenderness. There is no guarding.  Musculoskeletal:     Right lower leg: No edema.     Left lower leg: No edema.  Skin:    General: Skin is warm and dry.     Capillary Refill: Capillary refill takes 2 to 3 seconds.  Neurological:     General: No focal deficit present.     Mental Status: She is alert.  Psychiatric:        Mood and Affect: Mood normal.     ED Results / Procedures / Treatments   Labs (all labs ordered are listed, but only abnormal results are displayed) Labs Reviewed  COMPREHENSIVE METABOLIC PANEL - Abnormal; Notable for the  following components:      Result Value   Potassium 2.9 (*)    Chloride 93 (*)    CO2 18 (*)    Creatinine, Ser 1.21 (*)    Total Protein 8.5 (*)    AST 329 (*)    ALT 184 (*)    Total Bilirubin 2.0 (*)    Anion gap 34 (*)    All other components within normal limits  CBC - Abnormal; Notable for the following components:   WBC 12.6 (*)    Hemoglobin 11.4 (*)    HCT 35.3 (*)    RDW 22.1 (*)    Platelets 406 (*)    All other components within normal limits  URINALYSIS, ROUTINE W REFLEX MICROSCOPIC - Abnormal; Notable for the following components:   Ketones, ur 40 (*)    Protein, ur 100 (*)    Bacteria, UA RARE (*)    All other components within normal limits  I-STAT VENOUS BLOOD GAS, ED - Abnormal; Notable for the following components:   pCO2, Ven  39.0 (*)    pO2, Ven 23 (*)    Potassium 2.8 (*)    Calcium, Ion 1.00 (*)    All other components within normal limits  LIPASE, BLOOD  HCG, SERUM, QUALITATIVE  BLOOD GAS, VENOUS  CK  LACTIC ACID, PLASMA  LACTIC ACID, PLASMA  BETA-HYDROXYBUTYRIC ACID  ETHANOL  MAGNESIUM  HEPATIC FUNCTION PANEL  COMPREHENSIVE METABOLIC PANEL  PHOSPHORUS    EKG None  Radiology CT Renal Stone Study  Result Date: 01/27/2022 CLINICAL DATA:  Abdominal and flank pain, nausea and vomiting for 1 week EXAM: CT ABDOMEN AND PELVIS WITHOUT CONTRAST TECHNIQUE: Multidetector CT imaging of the abdomen and pelvis was performed following the standard protocol without IV contrast. RADIATION DOSE REDUCTION: This exam was performed according to the departmental dose-optimization program which includes automated exposure control, adjustment of the mA and/or kV according to patient size and/or use of iterative reconstruction technique. COMPARISON:  01/10/2022 FINDINGS: Lower chest: No acute abnormality. Hepatobiliary: Unchanged appearance of severe hepatic steatosis, which is more focal in the vicinity of the falciform ligament (series 2, image 17). No gallstones, gallbladder wall thickening, or biliary dilatation. Pancreas: Unremarkable. No pancreatic ductal dilatation or surrounding inflammatory changes. Spleen: Normal in size without significant abnormality. Adrenals/Urinary Tract: Adrenal glands are unremarkable. Kidneys are normal, without renal calculi, solid lesion, or hydronephrosis. Bladder is unremarkable. Stomach/Bowel: Stomach is within normal limits. Appendix is not clearly visualized and may be surgically absent. No evidence of bowel wall thickening, distention, or inflammatory changes. Vascular/Lymphatic: No significant vascular findings are present. No enlarged abdominal or pelvic lymph nodes. Reproductive: No mass or other significant abnormality. Tampon in the vagina. Other: No abdominal wall hernia or  abnormality. No ascites. Musculoskeletal: No acute or significant osseous findings. IMPRESSION: 1. No acute noncontrast CT findings of the abdomen or pelvis to explain abdominal pain. No urinary tract calculi or hydronephrosis. 2. Unchanged appearance of severe hepatic steatosis, which is more focal in the vicinity of the falciform ligament. Electronically Signed   By: Jearld Lesch M.D.   On: 01/27/2022 19:41    Procedures Procedures   Medications Ordered in ED Medications  metoCLOPramide (REGLAN) 20 mg in dextrose 5 % 50 mL IVPB ( Intravenous Canceled Entry 01/27/22 2353)  potassium chloride SA (KLOR-CON M) CR tablet 40 mEq (has no administration in time range)  0.45 % NaCl with KCl 20 mEq / L infusion (has no administration in time range)  potassium  chloride 10 mEq in 100 mL IVPB (has no administration in time range)  ondansetron (ZOFRAN) injection 4 mg (has no administration in time range)  promethazine (PHENERGAN) 12.5 mg in sodium chloride 0.9 % 50 mL IVPB (has no administration in time range)  LORazepam (ATIVAN) tablet 1-4 mg (has no administration in time range)    Or  LORazepam (ATIVAN) injection 1-4 mg (has no administration in time range)  thiamine (VITAMIN B1) tablet 100 mg (has no administration in time range)    Or  thiamine (VITAMIN B1) injection 100 mg (has no administration in time range)  folic acid (FOLVITE) tablet 1 mg (has no administration in time range)  multivitamin with minerals tablet 1 tablet (has no administration in time range)  lactated ringers bolus 1,000 mL (0 mLs Intravenous Stopped 01/27/22 2353)  ondansetron (ZOFRAN) injection 4 mg (4 mg Intravenous Given 01/27/22 1944)  metoCLOPramide (REGLAN) 5 MG/ML injection (20 mg  Given 01/27/22 2343)  dextrose 5 % bolus 1,000 mL (1,000 mLs Intravenous New Bag/Given 01/28/22 0048)    ED Course/ Medical Decision Making/ A&P                           Medical Decision Making Amount and/or Complexity of Data  Reviewed Labs: ordered. Radiology: ordered.  Risk Prescription drug management. Decision regarding hospitalization.   This patient presents to the ED for concern of nausea and vomiting. Differential diagnosis includes gastroenteritis, alcohol use disorder, bowel obstruction, viral URI, starvation ketosis   Lab Tests:  I Ordered, and personally interpreted labs.  The pertinent results include:  significant elevation in AST, ALT and bili from baseline. Anion gap of 34. Elevated creatinine at 1.21.   Imaging Studies ordered:  I ordered imaging studies including CT renal stone study I independently visualized and interpreted imaging which showed no evidence of urolithiasis. Noted that there is severe hepatic steatosis that is unchanged from last imaging I agree with the radiologist interpretation   Medicines ordered and prescription drug management:  I ordered medication including LR, Dextrose 5%, Zofran, and Reglan for nausea and vomiting  Reevaluation of the patient after these medicines showed that the patient improved, but still feeling nauseous I have reviewed the patients home medicines and have made adjustments as needed   Problem List / ED Course:  Patient presented to the ER with approximately 2 weeks of nausea and vomiting. She initially denied recent alcohol use, but on reexam stated that she had a heavy episode of drinking about 1 week ago but reiterated that she has not been able to keep almost any food or liquid down for about 2 weeks. Fluid resuscitation was initiated with antiemetics which helped improve symptoms slightly, but still reported nausea. Based on severe anion gap present and considerable elevation in liver enzymes, inpatient admission was considered and consulted with Dr. Joneen Roach. Consultation discussed possibility of starvation ketosis vs alcoholic ketosis and decision was ultimately reached to admit patient for further evaluation and treatment. Discussed  this plan with patient and she was agreeable to admission for further treatment.  Final Clinical Impression(s) / ED Diagnoses Final diagnoses:  Nausea and vomiting, unspecified vomiting type  Alcohol use disorder    Rx / DC Orders ED Discharge Orders     None         Smitty Knudsen, PA-C 01/28/22 0051    Smitty Knudsen, PA-C 01/28/22 0103    Rexford Maus, DO 01/28/22 1458

## 2022-01-27 NOTE — ED Triage Notes (Signed)
Patient BIB [facility] from Home.  Endorses ABD Pain and back associated with N/V. For approximately 1 Week. Seen at another ED for Same but LWBS due to Wait. States symptoms have worsened despite Zofran and Oral Hydration  VSS with EMS.   NAD Noted during Triage. A&Ox4. Gcs 15. BIB Wheelchair.

## 2022-01-27 NOTE — ED Provider Notes (Incomplete)
[facility] EMERGENCY DEPT Provider Note   CSN: [id number] Arrival date & time: 01/27/22  1556     History Chief Complaint  Patient presents with  . Emesis    Beth Barnes is a 26 y.o. female.  HPI Patient presents with complaints of 2 weeks of nausea and vomiting. She previously attempted to be seen at another ER but left prior to treatment. She states that she is struggling to keep and food or liquids down even when taking Zofran at home. She states that she previously had issues with alcohol use but denied any alcohol use in several weeks. Denies fever, chest pain, shortness of breath, swelling, headaches, or swelling in extremities.    Home Medications Prior to Admission medications   Medication Sig Start Date End Date Taking? Authorizing Provider  acetaminophen (TYLENOL) 500 MG tablet Take 1,000 mg by mouth every 6 (six) hours as needed for moderate pain or mild pain.    [provider]  dorzolamide-timolol (COSOPT) 22.3-6.8 MG/ML ophthalmic solution Place 1 drop into the left eye 2 (two) times daily. Patient not taking: Reported on 01/13/2022 04/28/21   [provider]  folic acid (FOLVITE) 1 MG tablet Take 1 tablet (1 mg total) by mouth daily. 06/03/21   Raulkar, Drema Pry, MD  levETIRAcetam (KEPPRA) 750 MG tablet Take 1 tablet (750 mg total) by mouth 2 (two) times daily. 12/02/21 03/02/22  Windell Norfolk, MD  traZODone (DESYREL) 50 MG tablet Take 100 mg by mouth at bedtime as needed. 10/16/21   [provider]      Allergies    Cetirizine & related and Other    Review of Systems   Review of Systems  Physical Exam Updated Vital Signs BP 105/70   Pulse 66   Temp 97.7 F (36.5 C) (Oral)   Resp 18   Ht 5\' 3"  (1.6 m)   Wt 56.2 kg   SpO2 100%   BMI 21.95 kg/m  Physical Exam  ED Results / Procedures / Treatments   Labs (all labs ordered are listed, but only abnormal results are displayed) Labs Reviewed  COMPREHENSIVE  METABOLIC PANEL - Abnormal; Notable for the following components:      Result Value   Potassium 2.9 (*)    Chloride 93 (*)    CO2 18 (*)    Creatinine, Ser 1.21 (*)    Total Protein 8.5 (*)    AST 329 (*)    ALT 184 (*)    Total Bilirubin 2.0 (*)    Anion gap 34 (*)    All other components within normal limits  CBC - Abnormal; Notable for the following components:   WBC 12.6 (*)    Hemoglobin 11.4 (*)    HCT 35.3 (*)    RDW 22.1 (*)    Platelets 406 (*)    All other components within normal limits  LIPASE, BLOOD  HCG, SERUM, QUALITATIVE  URINALYSIS, ROUTINE W REFLEX MICROSCOPIC  BLOOD GAS, VENOUS    EKG None  Radiology CT Renal Stone Study  Result Date: 01/27/2022 CLINICAL DATA:  Abdominal and flank pain, nausea and vomiting for 1 week EXAM: CT ABDOMEN AND PELVIS WITHOUT CONTRAST TECHNIQUE: Multidetector CT imaging of the abdomen and pelvis was performed following the standard protocol without IV contrast. RADIATION DOSE REDUCTION: This exam was performed according to the departmental dose-optimization program which includes automated exposure control, adjustment of the mA and/or kV according to patient size and/or use of iterative reconstruction technique. COMPARISON:  01/10/2022  FINDINGS: Lower chest: No acute abnormality. Hepatobiliary: Unchanged appearance of severe hepatic steatosis, which is more focal in the vicinity of the falciform ligament (series 2, image 17). No gallstones, gallbladder wall thickening, or biliary dilatation. Pancreas: Unremarkable. No pancreatic ductal dilatation or surrounding inflammatory changes. Spleen: Normal in size without significant abnormality. Adrenals/Urinary Tract: Adrenal glands are unremarkable. Kidneys are normal, without renal calculi, solid lesion, or hydronephrosis. Bladder is unremarkable. Stomach/Bowel: Stomach is within normal limits. Appendix is not clearly visualized and may be surgically absent. No evidence of bowel wall  thickening, distention, or inflammatory changes. Vascular/Lymphatic: No significant vascular findings are present. No enlarged abdominal or pelvic lymph nodes. Reproductive: No mass or other significant abnormality. Tampon in the vagina. Other: No abdominal wall hernia or abnormality. No ascites. Musculoskeletal: No acute or significant osseous findings. IMPRESSION: 1. No acute noncontrast CT findings of the abdomen or pelvis to explain abdominal pain. No urinary tract calculi or hydronephrosis. 2. Unchanged appearance of severe hepatic steatosis, which is more focal in the vicinity of the falciform ligament. Electronically Signed   By: Jearld Lesch M.D.   On: 01/27/2022 19:41    Procedures Procedures   Medications Ordered in ED Medications  metoCLOPramide (REGLAN) 20 mg in dextrose 5 % 50 mL IVPB (has no administration in time range)  lactated ringers bolus 1,000 mL (0 mLs Intravenous Stopped 01/27/22 2353)  ondansetron (ZOFRAN) injection 4 mg (4 mg Intravenous Given 01/27/22 1944)  metoCLOPramide (REGLAN) 5 MG/ML injection (20 mg  Given 01/27/22 2343)    ED Course/ Medical Decision Making/ A&P                           Medical Decision Making Amount and/or Complexity of Data Reviewed Labs: ordered. Radiology: ordered.  Risk Prescription drug management.   This patient presents to the ED for concern of *** differential diagnosis includes ***    Additional history obtained:  Additional history obtained from *** External records from outside source obtained and reviewed including ***   Lab Tests:  I Ordered, and personally interpreted labs.  The pertinent results include:  ***   Imaging Studies ordered:  I ordered imaging studies including ***  I independently visualized and interpreted imaging which showed *** I agree with the radiologist interpretation   Medicines ordered and prescription drug management:  I ordered medication including ***  for ***  Reevaluation  of the patient after these medicines showed that the patient {resolved/improved/worsened:23923::"improved"} I have reviewed the patients home medicines and have made adjustments as needed   Problem List / ED Course:  ***   Social Determinants of Health:       {Document critical care time when appropriate:1} {Document review of labs and clinical decision tools ie heart score, Chads2Vasc2 etc:1}  {Document your independent review of radiology images, and any outside records:1} {Document your discussion with family members, caretakers, and with consultants:1} {Document social determinants of health affecting pt's care:1} {Document your decision making why or why not admission, treatments were needed:1} Final Clinical Impression(s) / ED Diagnoses Final diagnoses:  None    Rx / DC Orders ED Discharge Orders     None

## 2022-01-28 ENCOUNTER — Encounter (HOSPITAL_COMMUNITY): Payer: Self-pay | Admitting: Family Medicine

## 2022-01-28 ENCOUNTER — Emergency Department (HOSPITAL_BASED_OUTPATIENT_CLINIC_OR_DEPARTMENT_OTHER): Payer: Medicaid Other

## 2022-01-28 ENCOUNTER — Encounter (HOSPITAL_COMMUNITY): Payer: Self-pay

## 2022-01-28 DIAGNOSIS — D638 Anemia in other chronic diseases classified elsewhere: Secondary | ICD-10-CM | POA: Diagnosis present

## 2022-01-28 DIAGNOSIS — Y900 Blood alcohol level of less than 20 mg/100 ml: Secondary | ICD-10-CM | POA: Diagnosis present

## 2022-01-28 DIAGNOSIS — R112 Nausea with vomiting, unspecified: Secondary | ICD-10-CM | POA: Diagnosis not present

## 2022-01-28 DIAGNOSIS — R9431 Abnormal electrocardiogram [ECG] [EKG]: Secondary | ICD-10-CM | POA: Diagnosis present

## 2022-01-28 DIAGNOSIS — E876 Hypokalemia: Secondary | ICD-10-CM

## 2022-01-28 DIAGNOSIS — R569 Unspecified convulsions: Secondary | ICD-10-CM | POA: Diagnosis present

## 2022-01-28 DIAGNOSIS — N179 Acute kidney failure, unspecified: Secondary | ICD-10-CM

## 2022-01-28 DIAGNOSIS — I5022 Chronic systolic (congestive) heart failure: Secondary | ICD-10-CM | POA: Diagnosis present

## 2022-01-28 DIAGNOSIS — R824 Acetonuria: Secondary | ICD-10-CM | POA: Diagnosis present

## 2022-01-28 DIAGNOSIS — K529 Noninfective gastroenteritis and colitis, unspecified: Secondary | ICD-10-CM | POA: Insufficient documentation

## 2022-01-28 DIAGNOSIS — E8729 Other acidosis: Secondary | ICD-10-CM | POA: Diagnosis present

## 2022-01-28 DIAGNOSIS — K760 Fatty (change of) liver, not elsewhere classified: Secondary | ICD-10-CM | POA: Diagnosis present

## 2022-01-28 DIAGNOSIS — Q150 Congenital glaucoma: Secondary | ICD-10-CM | POA: Diagnosis not present

## 2022-01-28 DIAGNOSIS — R7401 Elevation of levels of liver transaminase levels: Secondary | ICD-10-CM | POA: Diagnosis present

## 2022-01-28 DIAGNOSIS — E860 Dehydration: Secondary | ICD-10-CM

## 2022-01-28 DIAGNOSIS — H548 Legal blindness, as defined in USA: Secondary | ICD-10-CM | POA: Diagnosis present

## 2022-01-28 DIAGNOSIS — F1010 Alcohol abuse, uncomplicated: Secondary | ICD-10-CM | POA: Diagnosis present

## 2022-01-28 DIAGNOSIS — Z720 Tobacco use: Secondary | ICD-10-CM

## 2022-01-28 DIAGNOSIS — R739 Hyperglycemia, unspecified: Secondary | ICD-10-CM | POA: Diagnosis present

## 2022-01-28 DIAGNOSIS — Z888 Allergy status to other drugs, medicaments and biological substances status: Secondary | ICD-10-CM | POA: Diagnosis not present

## 2022-01-28 DIAGNOSIS — F17210 Nicotine dependence, cigarettes, uncomplicated: Secondary | ICD-10-CM | POA: Diagnosis present

## 2022-01-28 DIAGNOSIS — Z87898 Personal history of other specified conditions: Secondary | ICD-10-CM

## 2022-01-28 DIAGNOSIS — E872 Acidosis, unspecified: Secondary | ICD-10-CM | POA: Diagnosis present

## 2022-01-28 DIAGNOSIS — Z79899 Other long term (current) drug therapy: Secondary | ICD-10-CM | POA: Diagnosis not present

## 2022-01-28 DIAGNOSIS — Z8674 Personal history of sudden cardiac arrest: Secondary | ICD-10-CM | POA: Diagnosis not present

## 2022-01-28 LAB — COMPREHENSIVE METABOLIC PANEL
ALT: 197 U/L — ABNORMAL HIGH (ref 0–44)
ALT: 111 U/L — ABNORMAL HIGH (ref 0–44)
AST: 398 U/L — ABNORMAL HIGH (ref 15–41)
AST: 132 U/L — ABNORMAL HIGH (ref 15–41)
Albumin: 3.7 g/dL (ref 3.5–5.0)
Albumin: 3.2 g/dL — ABNORMAL LOW (ref 3.5–5.0)
Alkaline Phosphatase: 67 U/L (ref 38–126)
Alkaline Phosphatase: 48 U/L (ref 38–126)
Anion gap: 25 — ABNORMAL HIGH (ref 5–15)
Anion gap: 8 (ref 5–15)
BUN: 6 mg/dL (ref 6–20)
BUN: 8 mg/dL (ref 6–20)
CO2: 20 mmol/L — ABNORMAL LOW (ref 22–32)
CO2: 27 mmol/L (ref 22–32)
Calcium: 8.4 mg/dL — ABNORMAL LOW (ref 8.9–10.3)
Calcium: 7.7 mg/dL — ABNORMAL LOW (ref 8.9–10.3)
Chloride: 92 mmol/L — ABNORMAL LOW (ref 98–111)
Chloride: 101 mmol/L (ref 98–111)
Creatinine, Ser: 1.6 mg/dL — ABNORMAL HIGH (ref 0.44–1.00)
Creatinine, Ser: 1.18 mg/dL — ABNORMAL HIGH (ref 0.44–1.00)
GFR, Estimated: 46 mL/min — ABNORMAL LOW (ref >60)
GFR, Estimated: >60 mL/min (ref >60)
Glucose, Bld: 240 mg/dL — ABNORMAL HIGH (ref 70–99)
Glucose, Bld: 111 mg/dL — ABNORMAL HIGH (ref 70–99)
Potassium: 2.2 mmol/L — CL (ref 3.5–5.1)
Potassium: 3 mmol/L — ABNORMAL LOW (ref 3.5–5.1)
Sodium: 137 mmol/L (ref 135–145)
Sodium: 136 mmol/L (ref 135–145)
Total Bilirubin: 1.3 mg/dL — ABNORMAL HIGH (ref 0.3–1.2)
Total Bilirubin: 1.2 mg/dL (ref 0.3–1.2)
Total Protein: 6.8 g/dL (ref 6.5–8.1)
Total Protein: 5.5 g/dL — ABNORMAL LOW (ref 6.5–8.1)

## 2022-01-28 LAB — I-STAT VENOUS BLOOD GAS, ED
Acid-Base Excess: 1 mmol/L (ref 0.0–2.0)
Bicarbonate: 25.7 mmol/L (ref 20.0–28.0)
Calcium, Ion: 1 mmol/L — ABNORMAL LOW (ref 1.15–1.40)
HCT: 36 % (ref 36.0–46.0)
Hemoglobin: 12.2 g/dL (ref 12.0–15.0)
O2 Saturation: 41 %
Potassium: 2.8 mmol/L — ABNORMAL LOW (ref 3.5–5.1)
Sodium: 142 mmol/L (ref 135–145)
TCO2: 27 mmol/L (ref 22–32)
pCO2, Ven: 39 mmHg — ABNORMAL LOW (ref 44–60)
pH, Ven: 7.428 (ref 7.25–7.43)
pO2, Ven: 23 mmHg — CL (ref 32–45)

## 2022-01-28 LAB — CBC WITH DIFFERENTIAL/PLATELET
Abs Immature Granulocytes: 0.05 10*3/uL (ref 0.00–0.07)
Basophils Absolute: 0.1 10*3/uL (ref 0.0–0.1)
Basophils Relative: 1 %
Eosinophils Absolute: 0 10*3/uL (ref 0.0–0.5)
Eosinophils Relative: 0 %
HCT: 30.8 % — ABNORMAL LOW (ref 36.0–46.0)
Hemoglobin: 10.2 g/dL — ABNORMAL LOW (ref 12.0–15.0)
Immature Granulocytes: 0 %
Lymphocytes Relative: 11 %
Lymphs Abs: 1.3 10*3/uL (ref 0.7–4.0)
MCH: 28.6 pg (ref 26.0–34.0)
MCHC: 33.1 g/dL (ref 30.0–36.0)
MCV: 86.3 fL (ref 80.0–100.0)
Monocytes Absolute: 1.1 10*3/uL — ABNORMAL HIGH (ref 0.1–1.0)
Monocytes Relative: 10 %
Neutro Abs: 9.3 10*3/uL — ABNORMAL HIGH (ref 1.7–7.7)
Neutrophils Relative %: 78 %
Platelets: 362 10*3/uL (ref 150–400)
RBC: 3.57 MIL/uL — ABNORMAL LOW (ref 3.87–5.11)
RDW: 22.2 % — ABNORMAL HIGH (ref 11.5–15.5)
WBC: 11.8 10*3/uL — ABNORMAL HIGH (ref 4.0–10.5)
nRBC: 0 % (ref 0.0–0.2)

## 2022-01-28 LAB — ACETAMINOPHEN LEVEL: Acetaminophen (Tylenol), Serum: <10 ug/mL — ABNORMAL LOW (ref 10–30)

## 2022-01-28 LAB — PROTIME-INR
INR: 1.3 — ABNORMAL HIGH (ref 0.8–1.2)
Prothrombin Time: 16.5 seconds — ABNORMAL HIGH (ref 11.4–15.2)

## 2022-01-28 LAB — HEPATIC FUNCTION PANEL
ALT: 214 U/L — ABNORMAL HIGH (ref 0–44)
AST: 404 U/L — ABNORMAL HIGH (ref 15–41)
Albumin: 4.7 g/dL (ref 3.5–5.0)
Alkaline Phosphatase: 75 U/L (ref 38–126)
Bilirubin, Direct: 0.4 mg/dL — ABNORMAL HIGH (ref 0.0–0.2)
Indirect Bilirubin: 1.5 mg/dL — ABNORMAL HIGH (ref 0.3–0.9)
Total Bilirubin: 1.9 mg/dL — ABNORMAL HIGH (ref 0.3–1.2)
Total Protein: 8.4 g/dL — ABNORMAL HIGH (ref 6.5–8.1)

## 2022-01-28 LAB — URINALYSIS, ROUTINE W REFLEX MICROSCOPIC
Bilirubin Urine: NEGATIVE
Glucose, UA: NEGATIVE mg/dL
Hgb urine dipstick: NEGATIVE
Ketones, ur: 40 mg/dL — AB
Leukocytes,Ua: NEGATIVE
Nitrite: NEGATIVE
Protein, ur: 100 mg/dL — AB
Specific Gravity, Urine: 1.026 (ref 1.005–1.030)
pH: 5.5 (ref 5.0–8.0)

## 2022-01-28 LAB — LACTIC ACID, PLASMA
Lactic Acid, Venous: >9 mmol/L (ref 0.5–1.9)
Lactic Acid, Venous: 8.7 mmol/L (ref 0.5–1.9)
Lactic Acid, Venous: 4.3 mmol/L (ref 0.5–1.9)
Lactic Acid, Venous: 1.4 mmol/L (ref 0.5–1.9)

## 2022-01-28 LAB — GLUCOSE, CAPILLARY
Glucose-Capillary: 92 mg/dL (ref 70–99)
Glucose-Capillary: 98 mg/dL (ref 70–99)

## 2022-01-28 LAB — HEPATITIS PANEL, ACUTE
HCV Ab: NONREACTIVE
Hep A IgM: NONREACTIVE
Hep B C IgM: NONREACTIVE
Hepatitis B Surface Ag: NONREACTIVE

## 2022-01-28 LAB — MAGNESIUM
Magnesium: 1 mg/dL — ABNORMAL LOW (ref 1.7–2.4)
Magnesium: 1 mg/dL — ABNORMAL LOW (ref 1.7–2.4)
Magnesium: 1 mg/dL — ABNORMAL LOW (ref 1.7–2.4)

## 2022-01-28 LAB — ETHANOL: Alcohol, Ethyl (B): <10 mg/dL (ref <10)

## 2022-01-28 LAB — BETA-HYDROXYBUTYRIC ACID: Beta-Hydroxybutyric Acid: 0.75 mmol/L — ABNORMAL HIGH (ref 0.05–0.27)

## 2022-01-28 LAB — CK: Total CK: 65 U/L (ref 38–234)

## 2022-01-28 LAB — BRAIN NATRIURETIC PEPTIDE: B Natriuretic Peptide: 196 pg/mL — ABNORMAL HIGH (ref 0.0–100.0)

## 2022-01-28 LAB — SALICYLATE LEVEL: Salicylate Lvl: <7 mg/dL — ABNORMAL LOW (ref 7.0–30.0)

## 2022-01-28 LAB — PHOSPHORUS
Phosphorus: 4.8 mg/dL — ABNORMAL HIGH (ref 2.5–4.6)
Phosphorus: 2.9 mg/dL (ref 2.5–4.6)

## 2022-01-28 MED ORDER — ENOXAPARIN SODIUM 40 MG/0.4ML IJ SOSY
40.0000 mg | PREFILLED_SYRINGE | INTRAMUSCULAR | Status: DC
  Administered 2022-01-28 – 2022-01-29 (×2): 40 mg via SUBCUTANEOUS
  Filled 2022-01-28: qty 0.4

## 2022-01-28 MED ORDER — LORAZEPAM 1 MG PO TABS: 1.0000 mg | ORAL_TABLET | ORAL | Status: DC | PRN

## 2022-01-28 MED ORDER — MAGNESIUM SULFATE 2 GM/50ML IV SOLN
2.0000 g | Freq: Once | INTRAVENOUS | Status: AC
  Administered 2022-01-28: 2 g via INTRAVENOUS
  Filled 2022-01-28: qty 50

## 2022-01-28 MED ORDER — THIAMINE MONONITRATE 100 MG PO TABS: 100.0000 mg | ORAL_TABLET | Freq: Every day | ORAL | Status: DC

## 2022-01-28 MED ORDER — ONDANSETRON HCL 4 MG/2ML IJ SOLN: 4.0000 mg | Freq: Four times a day (QID) | INTRAMUSCULAR | Status: DC | PRN

## 2022-01-28 MED ORDER — THIAMINE HCL 100 MG/ML IJ SOLN
100.0000 mg | Freq: Every day | INTRAMUSCULAR | Status: DC
  Administered 2022-01-28: 100 mg via INTRAVENOUS
  Filled 2022-01-28: qty 2

## 2022-01-28 MED ORDER — POTASSIUM CHLORIDE 10 MEQ/100ML IV SOLN
10.0000 meq | INTRAVENOUS | Status: AC
  Administered 2022-01-28 (×6): 10 meq via INTRAVENOUS
  Filled 2022-01-28 (×4): qty 100

## 2022-01-28 MED ORDER — ACETAMINOPHEN 325 MG PO TABS: 650.0000 mg | ORAL_TABLET | Freq: Four times a day (QID) | ORAL | Status: DC | PRN

## 2022-01-28 MED ORDER — LEVETIRACETAM 750 MG PO TABS
750.0000 mg | ORAL_TABLET | Freq: Two times a day (BID) | ORAL | Status: DC
  Administered 2022-01-28 – 2022-01-29 (×2): 750 mg via ORAL
  Filled 2022-01-28 (×2): qty 1

## 2022-01-28 MED ORDER — POTASSIUM CHLORIDE CRYS ER 20 MEQ PO TBCR
40.0000 meq | EXTENDED_RELEASE_TABLET | Freq: Once | ORAL | Status: DC
  Filled 2022-01-28: qty 2

## 2022-01-28 MED ORDER — POTASSIUM CHLORIDE CRYS ER 20 MEQ PO TBCR
40.0000 meq | EXTENDED_RELEASE_TABLET | Freq: Once | ORAL | Status: AC
  Administered 2022-01-28: 40 meq via ORAL
  Filled 2022-01-28: qty 2

## 2022-01-28 MED ORDER — DEXTROSE 5 % IV BOLUS
1000.0000 mL | Freq: Once | INTRAVENOUS | Status: AC
  Administered 2022-01-28: 1000 mL via INTRAVENOUS

## 2022-01-28 MED ORDER — NICOTINE 14 MG/24HR TD PT24: 14.0000 mg | MEDICATED_PATCH | Freq: Every day | TRANSDERMAL | Status: DC | PRN

## 2022-01-28 MED ORDER — POTASSIUM CHLORIDE 10 MEQ/100ML IV SOLN: 10.0000 meq | INTRAVENOUS | Status: DC

## 2022-01-28 MED ORDER — FOLIC ACID 1 MG PO TABS
1.0000 mg | ORAL_TABLET | Freq: Every day | ORAL | Status: DC
  Administered 2022-01-28 – 2022-01-29 (×2): 1 mg via ORAL
  Filled 2022-01-28 (×2): qty 1

## 2022-01-28 MED ORDER — THIAMINE HCL 100 MG/ML IJ SOLN: 100.0000 mg | Freq: Every day | INTRAMUSCULAR | Status: DC

## 2022-01-28 MED ORDER — MAGNESIUM SULFATE 2 GM/50ML IV SOLN: 2.0000 g | Freq: Once | INTRAVENOUS | Status: DC

## 2022-01-28 MED ORDER — MAGNESIUM SULFATE 4 GM/100ML IV SOLN
4.0000 g | Freq: Once | INTRAVENOUS | Status: AC
  Administered 2022-01-28: 4 g via INTRAVENOUS
  Filled 2022-01-28: qty 100

## 2022-01-28 MED ORDER — ADULT MULTIVITAMIN W/MINERALS CH
1.0000 | ORAL_TABLET | Freq: Every day | ORAL | Status: DC
  Administered 2022-01-28 – 2022-01-29 (×2): 1 via ORAL
  Filled 2022-01-28 (×2): qty 1

## 2022-01-28 MED ORDER — KCL-LACTATED RINGERS 20 MEQ/L IV SOLN
INTRAVENOUS | Status: DC
  Filled 2022-01-28 (×2): qty 1000

## 2022-01-28 MED ORDER — POTASSIUM CHLORIDE 10 MEQ/100ML IV SOLN
10.0000 meq | INTRAVENOUS | Status: DC
  Administered 2022-01-28: 10 meq via INTRAVENOUS
  Filled 2022-01-28: qty 100

## 2022-01-28 MED ORDER — SODIUM CHLORIDE 0.9 % IV SOLN
12.5000 mg | Freq: Four times a day (QID) | INTRAVENOUS | Status: DC | PRN
  Filled 2022-01-28: qty 0.5

## 2022-01-28 MED ORDER — THIAMINE MONONITRATE 100 MG PO TABS
100.0000 mg | ORAL_TABLET | Freq: Every day | ORAL | Status: DC
  Administered 2022-01-29: 100 mg via ORAL
  Filled 2022-01-28 (×2): qty 1

## 2022-01-28 MED ORDER — POTASSIUM CHLORIDE IN NACL 20-0.45 MEQ/L-% IV SOLN
INTRAVENOUS | Status: DC
  Filled 2022-01-28: qty 1000

## 2022-01-28 MED ORDER — MAGNESIUM SULFATE 4 GM/100ML IV SOLN
4.0000 g | Freq: Once | INTRAVENOUS | Status: AC
  Administered 2022-01-28: 4 g via INTRAVENOUS
  Filled 2022-01-28 (×2): qty 100

## 2022-01-28 MED ORDER — ACETAMINOPHEN 650 MG RE SUPP: 650.0000 mg | Freq: Four times a day (QID) | RECTAL | Status: DC | PRN

## 2022-01-28 MED ORDER — SODIUM CHLORIDE 0.9 % IV SOLN
750.0000 mg | Freq: Two times a day (BID) | INTRAVENOUS | Status: DC
  Administered 2022-01-28: 750 mg via INTRAVENOUS
  Filled 2022-01-28 (×2): qty 7.5

## 2022-01-28 MED ORDER — POTASSIUM CHLORIDE 10 MEQ/100ML IV SOLN
INTRAVENOUS | Status: AC
  Administered 2022-01-28: 10 meq
  Filled 2022-01-28: qty 100

## 2022-01-28 MED ORDER — NALTREXONE HCL 50 MG PO TABS
25.0000 mg | ORAL_TABLET | Freq: Every day | ORAL | Status: DC
  Administered 2022-01-28 – 2022-01-29 (×2): 25 mg via ORAL
  Filled 2022-01-28 (×2): qty 1

## 2022-01-28 MED ORDER — POTASSIUM CHLORIDE 10 MEQ/100ML IV SOLN
10.0000 meq | INTRAVENOUS | Status: AC
  Administered 2022-01-28 (×2): 10 meq via INTRAVENOUS
  Filled 2022-01-28 (×2): qty 100

## 2022-01-28 MED ORDER — LORAZEPAM 2 MG/ML IJ SOLN: 1.0000 mg | INTRAMUSCULAR | Status: DC | PRN

## 2022-01-28 NOTE — Assessment & Plan Note (Addendum)
Qtc corrected to normal range. -Transition to EKGs daily

## 2022-01-28 NOTE — Assessment & Plan Note (Deleted)
Lactic acidosis persists, presented >9, now 8.7 this a.m.  Further, patient presents with anion gap of 25 and hyperglycemia of 240.  Also presented with tracely elevated BHB of 0.75 may represent starvation ketoacidosis in the setting of alcohol use.  Transaminitis labs of worsened.  No history of diabetes, recent A1c on 4//23 4.7.  No evidence of glucosuria on UA although does have evidence of ketonuria.  Suspect improvement with addressing underlying cause, most likely combination starvation, alcoholic. Hyperglycemia was related to D5 bolus, no reason to suspect new onset diabetes.  -F/u lactic acid

## 2022-01-28 NOTE — Assessment & Plan Note (Addendum)
Significantly improved. Most likely related to alcohol use in the setting of chronic liver injury. Viral hepatitis panel negative. Maddrey discriminant function score 22, not necessary to start prednisolone at this time. -Trend CMP

## 2022-01-28 NOTE — Hospital Course (Signed)
   Outpatient GI follow up

## 2022-01-28 NOTE — Assessment & Plan Note (Deleted)
Magnesium 1.0, received 2 g.  Repeat 1.0. -Magnesium 4 g IV -Recheck 5 PM mag

## 2022-01-28 NOTE — Assessment & Plan Note (Addendum)
Chronic alcohol abuse with binge drinking.  CIWA 0.  Does have prior history of seizures and alcohol withdrawal.  Last drink about 1 week ago.  If true, low risk of withdrawal at this time. -Monitor CIWA's, Ativan accordingly -Continue folic acid 1 mg daily, thiamine 100 mg daily, multivitamin daily

## 2022-01-28 NOTE — Assessment & Plan Note (Addendum)
Presented with creatinine 1.21 (baseline 0.75), suspect related to prerenal causes given frequent alcohol use and likely coming dehydration.  Worsened this a.m. to 1.6. -Monitor creatinine on 5 PM CMP -Change fluids to LR with KCl 20 mEq/L @100mL /hr

## 2022-01-28 NOTE — Assessment & Plan Note (Addendum)
Unsure of medication adherence.  Home medications include Keppra 750 mg twice daily. -Follow-up Keppra level -transition to oral Keppra

## 2022-01-28 NOTE — Assessment & Plan Note (Addendum)
Of note, history of heart failure and prior cardiac arrest.  Did have home medications of Entresto, spironolactone, Coreg, Lasix, Marcelline Deist that were held after last admission on 01/10/2022.  These medications have not been restarted and additionally not addressed as patient no showed to establishing with PCP appointment. -Continue to hold these medications in the setting of AKI -Follow-up BNP

## 2022-01-28 NOTE — H&P (Addendum)
History and Physical      Beth Barnes [id number] DOB: 03/11/1995 DOA: 01/27/2022  PCP: Patient, No Pcp Per (will further review) Patient coming from: home   I have personally briefly reviewed patient's old medical records in [place]  Chief Complaint: Nausea vomiting  HPI: Beth Barnes is a 26 y.o. female with medical history significant for chronic alcohol abuse, chronic alcoholic hepatic steatosis, seizures, anemia of chronic disease , who is admitted to [facility] on 01/27/2022 We will transferred from [facility] emergency department with acute on chronic transaminitis after presenting from home to the latter facility complaining of nausea/vomiting.   The patient reports 2 weeks of recurrent nausea resulting in at least 3-4 daily episodes of nonbloody, nonbilious emesis [facility] with most recent such emesis occurring at [facility] emergency department earlier this evening.  She feels that the frequency of her nausea/vomiting episodes has been increasing, particularly over the course of the last week, noting that she now feels unable to tolerate p.o., reporting very limited oral intake either food or water over the last few days.  She denies any associated diarrhea, melena, or hematochezia.  She notes very mild left flank discomfort that has been sharp, intermittent, nonradiating, worsening with the acti of vomiting.  She denies any associated dysuria, gross hematuria, or change in urinary urgency/frequency.  She also denies any associated subjective fever, chills, rigors or generalized myalgias.  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  Denies any headache or neck stiffness not associated with any recent chest pain, shortness of breath, orthopnea, PND, or worsening of peripheral edema.  No recent rhinitis, rhinorrhea, or sore throat.  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  However, she subsequently conveyed a single alcohol binge 1 week ago, giving that this represents the most recent he consumed alcohol.  She does note worsening of her nausea/vomiting following this alcohol binge 1 week ago, but also conveys that she is experiencing symptoms of recurrent nausea/vomiting for the week leading up to this alcohol consumption.  Chart review reveals a history of intermittent marijuana use, although the patient denies any recent marijuana use or any recent use of additional recreational drugs.   Per chart review, she has a history of chronic alcoholic hepatic steatosis, was recently started on CT abdomen/pelvis on 01/10/2022.  Associated with chronic transaminitis dating back to October 2022.  Per chart review, most recent prior liver enzymes were checked on 01/10/2021 were notable for the following: Alkaline phosphatase 86, AST 166, ALT 99, and total bilirubin 1.7.  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  Echo in April 2023 was notable for LVEF less than [id number], grade 2 diastolic dysfunction, moderately reduced right ventricular systolic function.  However, more recent echocardiogram on 12/12/2021, showed interval improvement with LVEF 66 5%, no focal measure maladies, normal diastolic parameters, and normal right ventricular systolic function.   During most recent prior hospitalization acute kidney injury was noted, with creatinine 2.88 on 01/10/2022, followed by most recent prior value of 2.2 to on 01/11/2022.  She also has a history of anemia of chronic disease, associated with baseline hemoglobin ranging 9-12 dating back to April 2023, with most recent prior hemoglobin  noted to be 11.2 on  01/20/2022.  She also has a history of seizures, for which she is prescribed Keppra.  She notes that her ability to take Keppra over the last 6 has been compromised by her limited ability to tolerate p.o. in the setting of intractable nausea/vomiting.  Denies any recent known seizure-like activity.  No recent trauma.  Not on any blood thinners as an outpatient.      [facility] ED Course:  Vital signs in the ED were notable for the following: Afebrile; heart rate in the range of [id number]; systolic blood pressures in the low 100s to 120s; respiratory rate 16-18, oxygen saturation 100% on room air.  Labs were notable for the following: CMP notable for sodium 145, potassium 2.9, bicarbonate 18, anion gap 34, BUN 10, creatinine 1.21, glucose 73, alkaline phosphatase 75, AST 329, ALT 184, and total Ruben 2.0, successfully found to be indirectly predominant.  5 history 5, CPK 65 CBC notable for white cell count 12,600 relative to most recent prior white blood cell count of 7400 on 01/20/2022 with today's weight blood cell count associated with 91% neutrophils, hemoglobin 11.4 with normocytic/normochromic properties.,  And platelet count 406.  Urinalysis notable for 6-70 blood cells, nitrate/leukocyte esterase negative, specifically 1.026 40 ketones and was also positive for [place] cast.  Initial lactic acid level found to be greater than 9.0, which is relative to most recent prior lactic acid level of 8.4 in April 2023.  Serum ethanol level less than 10.  Imaging and additional notable ED work-up: CT renal stone, in comparison to CT abdomen/pelvis from 12-23, per radiology report shows no evidence of acute intra-abdominal or acute intrapelvic process, including no evidence of ureteral stones nor any evidence of hydronephrosis.  Additionally, CT renal stone shows no evidence of gallstones, gallbladder wall thickening, pericholecystic fluid, choledocholithiasis, or biliary dilation will associated unremarkable  pancreas in the absence of associated laboratory change and no evidence of pancreatic ductal dilation.  CT renal stone also shows unchanged severe hepatic steatosis.   Considering right for quadrant ultrasound showed no evidence of gallstones, no evidence of acute cholecystitis, no common bile duct dilation, will demonstrating evidence of hepatic steatosis.  While in the ED, the following were administered: Reglan 20 mgV x 1, Zofran 4 mg IV x 1, a 1 L D5 bolus, half NS with 20 mEq/L KCl at 100 cc/h, lactated Ringer's x 1 L bolus; potassium chloride 20 mill colons IV over 2 hours.  Subsequently, the patient was admitted to [facility] for further evaluation and management acute on chronic transaminitis in the setting of presenting intractable nausea/vomiting, with presentation suggestive of dehydration and initial laboratory abnormalities to include lactic acidosis, hypomagnesemia, hypokalemia, anion gap metabolic acidosis, as well as resolving acute kidney injury.      Review of Systems: As per HPI otherwise 10 point review of systems negative.   Past Medical History:  Diagnosis Date   Blind    Congenital glaucoma of both eyes    Hypokalemia    Seizures ([place])     Past Surgical History:  Procedure Laterality Date   EYE SURGERY      Social History:  reports that she has been smoking cigarettes. She has a 2.50 pack-year smoking history. She has never used smokeless tobacco. She reports that she does not currently use alcohol. She reports that she does not currently use drugs after having used the following drugs: Marijuana.   Allergies  Allergen Reactions   Cetirizine & Related Hives  Other     Pine trees; reaction unk.    Family History  Problem Relation Age of Onset   Healthy Mother    Healthy Father     Family history reviewed and not pertinent    Prior to Admission medications   Medication Sig Start Date End Date Taking? Authorizing Provider  acetaminophen (TYLENOL)  500 MG tablet Take 1,000 mg by mouth every 6 (six) hours as needed for moderate pain or mild pain.    [provider]  dorzolamide-timolol (COSOPT) 22.3-6.8 MG/ML ophthalmic solution Place 1 drop into the left eye 2 (two) times daily. Patient not taking: Reported on 01/13/2022 04/28/21   [provider]  folic acid (FOLVITE) 1 MG tablet Take 1 tablet (1 mg total) by mouth daily. 06/03/21   Raulkar, Clide Deutscher, MD  levETIRAcetam (KEPPRA) 750 MG tablet Take 1 tablet (750 mg total) by mouth 2 (two) times daily. 12/02/21 03/02/22  Alric Ran, MD  traZODone (DESYREL) 50 MG tablet Take 100 mg by mouth at bedtime as needed. 10/16/21   [provider]     Objective    Physical Exam: Vitals:   01/28/22 0100 01/28/22 0115 01/28/22 0145 01/28/22 0242  BP:   123/84 118/75  Pulse: 75 76 95 86  Resp:    16  Temp:    98.1 F (36.7 C)  TempSrc:      SpO2: 100% 100% 100% 100%  Weight:      Height:        General: appears to be stated age; alert, oriented Skin: warm, dry, no rash Head:  AT/[place] Mouth:  Oral mucosa membranes appear dry, normal dentition Neck: supple; trachea midline Heart:  RRR; did not appreciate any M/R/G Lungs: CTAB, did not appreciate any wheezes, rales, or rhonchi Abdomen: + BS; soft, hepatomegaly noted, NT Vascular: 2+ pedal pulses b/l; 2+ radial pulses b/l Extremities: no peripheral edema, no muscle wasting Neuro: strength and sensation intact in upper and lower extremities b/l    Labs on Admission: I have personally reviewed following labs and imaging studies  CBC: Recent Labs  Lab 01/27/22 1611 01/28/22 0044 01/28/22 0253  WBC 12.6*  --  11.8*  NEUTROABS  --   --  9.3*  HGB 11.4* 12.2 10.2*  HCT 35.3* 36.0 30.8*  MCV 87.4  --  86.3  PLT 406*  --  [id number]   Basic Metabolic Panel: Recent Labs  Lab 01/27/22 1611 01/28/22 0035 01/28/22 0044 01/28/22 0253 01/28/22 0254  NA 145  --  142  --  137  K 2.9*  --  2.8*  --  2.2*  CL 93*   --   --   --  92*  CO2 18*  --   --   --  20*  GLUCOSE 73  --   --   --  240*  BUN 10  --   --   --  6  CREATININE 1.21*  --   --   --  1.60*  CALCIUM 9.7  --   --   --  8.4*  MG  --  1.0*  --  1.0*  --   PHOS  --  4.8*  --  2.9  --    GFR: Estimated Creatinine Clearance: 44.5 mL/min (A) (by C-G formula based on SCr of 1.6 mg/dL (H)). Liver Function Tests: Recent Labs  Lab 01/27/22 1611 01/28/22 0035 01/28/22 0254  AST 329* 404* 398*  ALT 184* 214* 197*  ALKPHOS 75 75  67  BILITOT 2.0* 1.9* 1.3*  PROT 8.5* 8.4* 6.8  ALBUMIN 4.8 4.7 3.7   Recent Labs  Lab 01/27/22 1611  LIPASE 25   No results for input(s): "AMMONIA" in the last 168 hours. Coagulation Profile: No results for input(s): "INR", "PROTIME" in the last 168 hours. Cardiac Enzymes: Recent Labs  Lab 01/28/22 0035  CKTOTAL 65   BNP (last 3 results) No results for input(s): "PROBNP" in the last 8760 hours. HbA1C: No results for input(s): "HGBA1C" in the last 72 hours. CBG: No results for input(s): "GLUCAP" in the last 168 hours. Lipid Profile: No results for input(s): "CHOL", "HDL", "LDLCALC", "TRIG", "CHOLHDL", "LDLDIRECT" in the last 72 hours. Thyroid Function Tests: No results for input(s): "TSH", "T4TOTAL", "FREET4", "T3FREE", "THYROIDAB" in the last 72 hours. Anemia Panel: No results for input(s): "VITAMINB12", "FOLATE", "FERRITIN", "TIBC", "IRON", "RETICCTPCT" in the last 72 hours. Urine analysis:    Component Value Date/Time   COLORURINE YELLOW 01/28/2022 0028   APPEARANCEUR CLEAR 01/28/2022 0028   LABSPEC 1.026 01/28/2022 0028   PHURINE 5.5 01/28/2022 0028   GLUCOSEU NEGATIVE 01/28/2022 0028   HGBUR NEGATIVE 01/28/2022 0028   BILIRUBINUR NEGATIVE 01/28/2022 0028   KETONESUR 40 (A) 01/28/2022 0028   PROTEINUR 100 (A) 01/28/2022 0028   NITRITE NEGATIVE 01/28/2022 0028   LEUKOCYTESUR NEGATIVE 01/28/2022 0028    Radiological Exams on Admission: US Abdomen Limited RUQ (LIVER/GB)  Result Date:  01/28/2022 CLINICAL DATA:  Elevated liver enzymes EXAM: ULTRASOUND ABDOMEN LIMITED RIGHT UPPER QUADRANT COMPARISON:  CT abdomen pelvis 01/27/2022 FINDINGS: Gallbladder: No gallstones or wall thickening visualized. No sonographic Murphy sign noted by sonographer. Common bile duct: Diameter: 4 mm, within normal limits. No intrahepatic biliary ductal dilatation. Liver: No focal lesion identified. Increased and somewhat heterogeneous parenchymal echogenicity. Portal vein is patent on color Doppler imaging with normal direction of blood flow towards the liver. Other: None. IMPRESSION: 1. No cholelithiasis or sonographic evidence of cholecystitis. 2. Hepatic steatosis. Electronically Signed   By: Wiliam Ke M.D.   On: 01/28/2022 01:29   CT Renal Stone Study  Result Date: 01/27/2022 CLINICAL DATA:  Abdominal and flank pain, nausea and vomiting for 1 week EXAM: CT ABDOMEN AND PELVIS WITHOUT CONTRAST TECHNIQUE: Multidetector CT imaging of the abdomen and pelvis was performed following the standard protocol without IV contrast. RADIATION DOSE REDUCTION: This exam was performed according to the departmental dose-optimization program which includes automated exposure control, adjustment of the mA and/or kV according to patient size and/or use of iterative reconstruction technique. COMPARISON:  01/10/2022 FINDINGS: Lower chest: No acute abnormality. Hepatobiliary: Unchanged appearance of severe hepatic steatosis, which is more focal in the vicinity of the falciform ligament (series 2, image 17). No gallstones, gallbladder wall thickening, or biliary dilatation. Pancreas: Unremarkable. No pancreatic ductal dilatation or surrounding inflammatory changes. Spleen: Normal in size without significant abnormality. Adrenals/Urinary Tract: Adrenal glands are unremarkable. Kidneys are normal, without renal calculi, solid lesion, or hydronephrosis. Bladder is unremarkable. Stomach/Bowel: Stomach is within normal limits. Appendix  is not clearly visualized and may be surgically absent. No evidence of bowel wall thickening, distention, or inflammatory changes. Vascular/Lymphatic: No significant vascular findings are present. No enlarged abdominal or pelvic lymph nodes. Reproductive: No mass or other significant abnormality. Tampon in the vagina. Other: No abdominal wall hernia or abnormality. No ascites. Musculoskeletal: No acute or significant osseous findings. IMPRESSION: 1. No acute noncontrast CT findings of the abdomen or pelvis to explain abdominal pain. No urinary tract calculi or hydronephrosis. 2. Unchanged appearance of  severe hepatic steatosis, which is more focal in the vicinity of the falciform ligament. Electronically Signed   By: Delanna Ahmadi M.D.   On: 01/27/2022 19:41      Assessment/Plan   Principal Problem:   Transaminitis Active Problems:   AKI (acute kidney injury) (HCC)   Hypokalemia   Chronic alcohol abuse   Dehydration   Intractable nausea and vomiting   Lactic acidosis   Hypomagnesemia   History of seizures   Tobacco abuse   Anemia of chronic disease      #) Acute on chronic transaminitis: In the setting of a history of chronic alcoholic hepatic steatosis associated with chronic AST predominant transaminitis dating back to October 2022, this evening's liver enzymes show slight interval increase, while maintaining AST predominance, and showing no laboratory evidence of cholestatic pattern, noting that total bilirubin level, which is slightly higher than prior, is indirect predominant.  Additionally, CT abdomen/pelvis as well as upper quadrant ultrasound showed no evidence to suggest biliary obstruction, as further detailed above.  Given the patient's history of chronic alcohol abuse with acknowledgment of alcohol binge that occurred 1 week ago, differential includes further progression of her alcoholic hepatic steatosis versus acute alcoholic hepatitis.  Would typically have associated acute  alcoholic otitis with a higher bilirubin level, although there appears to be a 1 week gap in data points following her alcohol binge, potentially accounting for a downward trajectory.  In calculating Madrey's discriminant function, INR currently pending.  Of note, nonelevated lipase and CT scan shows no evidence of acute pancreatitis or any additional acute intra-abdominal process that would be overtly contributory to her presenting symptoms and worsening liver enzymes.    While felt to be less likely, given her history of acute biventricular systolic heart failure as a consequence of alcohol abuse in April 2023, differential would also include nausea/vomiting as well as acute on chronic transaminitis from alcohol induced acute decompensated heart failure resulting in congestive gastropathy and congestive hepatopathy, respectively.  No acute respiratory symptoms, and the patient is maintaining O2 sats of 100% on room air, although acutely decompensated right-sided heart failure still a possibility.  Will add on BNP, closely monitor ensuing volume status is further detailed below.  Will check acute viral hepatitis panel, although AST predominant nature of her transaminitis was weakness this.  Additionally, given concomitant anion gap metabolic acidosis, will also add on salicylate level, and check urinary drug screen further potential recreational drug induced hepatic toxic implications.  Plan: Check INR, urinary drug screen, acetaminophen level, salicylate level, acute viral hepatitis panel.  Repeat CMP in the morning.  Monitor strict I's and O's and daily weights.  The patient was counseled on the importance of complete abstinence from alcohol moving forward.           #) Intractable nausea/vomiting: Associated with nonbloody, nonbilious emesis in the absence of any diarrhea, currently unable to tolerate p.o., and resulting in significant reduction in oral intake of both food and water over the last  several days, contributing to clinical suggestion of dehydration, as well as a multitude of electrolyte abnormalities to include hypokalemia as well as hypomagnesemia.  In noting her magnesium level of 1.0, I have ordered stat EKG to evaluate for QTc prolongation to help guide insulin plan regarding antiemetics.  For now, we will continue existing order for prn IV Zofran, although if evidence of QTc prolongation, and alternate antiemetic would include prn Ativan for nausea.  No known history of diabetes or gastroparesis.  CT  abdomen/pelvis shows no evidence of acute gallbladder or pancreatic pathology, nor any evidence of obstructing ureteral stone, and urinalysis is inconsistent with urinary tract infection.  no evidence of bowel obstruction.   Plan: Further evaluation management of presenting acute on chronic transaminitis, as above.  For now, continue prn IV Zofran, with close monitoring of ensuing results of stat EKG.  Monitor strict I's and O's and daily weights.  Further evaluation and management of presenting hypokalemia and hypomagnesemia, as further detailed below repeat CMP in the morning.           #) Lactic acidosis: Presenting lactic acid level found to be greater than 9.0 relative to most recent prior value of 8.4 in April 2023.  Suspect multifactorial contributions, including underlying liver disease, starvation keto lactic acidosis, noting that urinalysis demonstrates 40 ketones, with the remainder of the presentation consistent with findings of DKA, alcoholic lactic acidosis in addition to clinical evidence of resultant dehydration..  Considered seizures given the degree of elevation of her lactic acidosis, particular given suboptimal compliance with outpatient Keppra.  However, she denies any recent history suggestive of acute seizures.  CPK noted to be nonelevated.  Presentation not associated with any acute focal neurologic deficits.  She was started on continuous half NS with 20  mill colons of potassium running at 100 cc/h and [facility].  Will follow for results of repeat lactic acid level, with consideration for transitioning to a more volume expansive/nephro protective set of IV fluids if no improvement in interval lactate trend.  Additionally, will closely monitor ensuing renal function trend in the setting of evidence of improving AKI.  Plan: Repeat lactic acid level now.  I also ordered a repeat lactate for 8 AM for further trending.  Monitor strict I's and O's and daily weights.  IV fluids, as above.  Check INR to evaluate hepatic synthetic function.  Add on salicylate, urinary drug screen.  Check VBG.  Repeat CMP/CBC in the morning.          #) Hypomagnesemia: Presenting serum magnesium level found to be 1.0, with likely contributions from recent increase in GI losses as well as suboptimal nutritional status in the setting of chronic alcohol abuse.  Plan: Magnesium sulfate 2 g IV over 2 hours x 1 dose now although she will likely require more supplemental IV magnesium than this.  Have ordered repeat serum magnesium level with morning labs.  Consider initiation of telemetry given this degree of hypomagnesemia.  Stat EKG.         #) Hypokalemia: Presenting serum potassium level 2.9 with similar differential contributing to presenting hypomagnesemia.  She is status post 20 mill colons of IV potassium chloride administered at [facility] earlier today, and is receiving half NS with 20**KCl at 100 cc/h.  Given the extent of her lactic acidosis, which may be associated with increased risk for extracellular potassium shift we will continue the existing potassium supplementation via continuous IV fluids, with close monitoring of potassium trend via repeat CMP in the morning.   Plan: Continue existing aforementioned 20 mill equivalents per liter KCl at 100 cc/h.  Repeat CMP in the morning.  Further evaluation management of presenting hypomagnesemia, as effective  supplementation 0 potassium will be influenced by her serum magnesium status.           #) Resolving acute kidney injury: Relative to serum creatinine data points during most recent prior hospitalization that were approaching 3.0, chart review reveals sequential decline in serum creatinine, now 1.21 this evening.  As she is at risk for ensuing worsening of renal function from multiple standpoints, including from intravascular depletion from an overall picture of dehydration given GI losses and diminished oral intake but also at increased risk for hepatorenal syndrome including renal implications of potential acute alcoholic hepatitis.  Presenting urinalysis with microscopy was notable for only 6-10 white blood cells, not consistent with UTI, but also demonstrated the presence hyaline cast, consistent with an overall picture of dehydration.   Plan: Monitor strict I's and O's and daily weights.  Attempt to avoid nephrotoxic agents.  Continue existing IV fluids.  Add on random urine sodium as well as random urine creatinine.  Repeat CMP in the morning.  Further evaluation management of acute on chronic transaminitis, as above.                #) Dehydration: Clinical eidence for such, including the appearance of dry oral mucous membranes as well as laboratory findings notable for UA demonstrating elevated specific gravity, as well as the  presence of hyaline casts.  This is in setting of recent increase in GI losses in the form of rectal nausea/vomiting and associated limited ability to tolerate p.o. over that timeframe.  No evidence of associated hypotension, but we will continue to monitor and see volume status closely.    Plan: Monitor strict I's and O's.  Daily weights.  Repeat CMP in the morning.  Continuous IVF's, as above.  Further evaluation and management of presenting intractable nausea/vomiting, as above.          #) Chronic Alcohol Abuse: documented history of such,  with patient reporting most recent alcohol consumption weekly.  Does not appear to be acutely intoxicated at this time, consistent with presenting serum ethanol level less than 10.  Has a history of seizures, presumably alcohol withdrawal seizures, and appears to have been off her [place] recently.  Given an evolving account of the timing of her most recent alcohol consumption will continue the CIWA protocol that was initiated at [place] and closely monitor Rensing evidence to suggest evolving alcohol withdrawal, while resuming her home Keppra through IV route of administration.    Plan: counseled the patient on the importance of reduction in alcohol consumption. Consult to transition of care team placed. Close monitoring of ensuing BP and HR via routine VS. Symptoms-based CIWA protocol with prn Ativan ordered. Seizure precautions.  Further evaluation and management of presenting hypomagnesemia and hypokalemia.  Repeat serum phosphorus level in the morning.  Further evaluation management of acute on chronic transaminitis, as above.  Check INR.  Check urinary drug screen.  Keppra 750 mg IV twice daily.             #) Chronic tobacco abuse: Patient conveys that she is a current smoker, although not on a daily basis, noting overall average of half pack per day, will noting that she has been smoking for approximately 5 years.   Plan: Counseled the patient for less than 2 minutes on the importance of complete smoking discontinuation.  Order placed for prn nicotine patch for use during this hospitalization.                 #) Anemia of chronic disease: baseline hgb range since April 2023 noted to be 9-12, with presenting hgb consistent with this range, in the absence of any overt evidence of active bleed.  Potentially multifactorial, with suspected contributions from anemia of chronic liver disease as well as more direct implications of her chronic alcohol  abuse with increased risk for  resultant electrolyte abnormalities, including increased risk for B12/folic acid deficiency although MCV reflects normocytic result, potentially as a consequence of the confluence of multifactorial influences.    Plan: Repeat CBC in the morning.  Check INR,        DVT prophylaxis: SCD's   Code Status: Full code Family Communication: none Disposition Plan: Per Rounding Team Consults called: none;  Admission status: Inpatient     I SPENT GREATER THAN 75  MINUTES IN CLINICAL CARE TIME/MEDICAL DECISION-MAKING IN COMPLETING THIS ADMISSION.      Exira DO [facility]  From [place]   01/28/2022, 4:42 AM

## 2022-01-28 NOTE — Assessment & Plan Note (Addendum)
Resolved, have not need Ativan for N/V.

## 2022-01-28 NOTE — Assessment & Plan Note (Signed)
K2.9 on presentation, worsened to 2.2 this a.m.  Overall received 20 mill equivalents K IV in addition to further supplementation with KCl 20 mEq/L infusion.  Refused oral K this a.m. -KCl 10 mEq IV every 1 hour x 6 -5 PM BMP

## 2022-01-28 NOTE — Social Work (Signed)
CSW acknowledges consult for SU counseling and resources. CSW met with pt at bedside. Pt notes she has not tried rehab or counseling, but she is being prescribed medication that should help with her cravings. Pt agreeable to have resources emailed to her so she is able to access them on her own. TOC is available for further needs. 

## 2022-01-28 NOTE — ED Notes (Signed)
Lactic acid >9 EDP made aware

## 2022-01-28 NOTE — Progress Notes (Addendum)
Beth Barnes is a 26 y.o. female w/ hx of congenital glaucoma, legally blind, seizures, CHF, hx of cardiac arrest, alcohol use disorder.  Presents with complaint of nausea and vomiting for 2 weeks.  She is unable to hold any food down.  She denies any abdominal pain, any chest pains.  She denies alcohol use.  In the ER patient's creatinine is 1.2, BUN 10, potassium 2.9, CO2 18, and anion gap 34.  Patient's LFTs are also elevated with AST of 329 ALT 184 total protein 8.5 total bili also elevated 2.1.  UA pending.  Platelets 4 6.  Admission requested.  Patient will be admitted, hepatitis panel, right upper quadrant ultrasound ordered.  CMP in AM.  Potassium repletion. Follow-up UA

## 2022-01-28 NOTE — Progress Notes (Signed)
Daily Progress Note Intern Pager: [phone]  Patient name: Beth Barnes Medical record number: [id number] Date of birth: 08-08-1995 Age: 26 y.o. Gender: female  Primary Care Provider: Patient, No Pcp Per Consultants: None Code Status: Full  Pt Overview and Major Events to Date:  12/20: Admitted to [facility] 12/20: Transferred to FMTS  Assessment and Plan: Beth Barnes is a 26 y.o. female presenting with transaminitis suspect to be related to alcohol use and further lactic acidosis.  Potential causes for lactic acidosis include tissue hypoxia leading to cardiogenic, septic, hemorrhagic shock, seizure, car monoxide or cyanide poisoning or nonhypoxic purposes such as hepatic or renal failure, diabetes, ketoacidosis.  Leading causes currently are starvation and alcoholic ketoacidosis.  Further may consider carbon oxide poisoning considering patient's last admission was related to carbon oxide in the household.  Pertinent PMH/PSH includes congenital glaucoma (legally blind), seizures, CHF, history of cardiac arrest, alcohol use disorder. * Transaminitis Presented with AST/ALT 329/184>404/214>398/197.  Normal alcohol level.  CK and alk phos normal.  RUQ ultrasound showed no evidence of gallstones, acute cholecystitis, common bile duct dilation but did demonstrate evidence of hepatic steatosis, CT notes unchanged appearance of severe hepatic steatosis. Most likely related to alcohol use. Maddrey discriminant function score 22, not necessary to start prednisolone at this time.  -Discontinue Tylenol -Follow-up viral hepatitis panel -Trend CMP  AKI (acute kidney injury) ([place]) Presented with creatinine 1.21 (baseline 0.75), suspect related to prerenal causes given frequent alcohol use and likely coming dehydration.  Worsened this a.m. to 1.6. -Monitor creatinine on 5 PM CMP -Change fluids to LR with KCl 20 mEq/L _0 /hr  QT prolongation QTc 554, recent QT prolonging meds  consist of Zofran, Reglan, Keppra. -Discontinue Zofran, Reglan -Serial EKGs every 12 hours  History of seizures Unsure of medication adherence.  Home medications include Keppra 750 mg twice daily. -Follow-up Keppra level -transition to oral Keppra  Hypomagnesemia Magnesium 1.0, received 2 g.  Repeat 1.0. -Magnesium 4 g IV -Recheck 5 PM mag  Lactic acidosis Lactic acidosis persists, presented >9, now 8.7 this a.m.  Further, patient presents with anion gap of 25 and hyperglycemia of 240.  Also presented with tracely elevated BHB of 0.75 may represent starvation ketoacidosis in the setting of alcohol use.  Transaminitis labs of worsened.  No history of diabetes, recent A1c on 4//23 4.7.  No evidence of glucosuria on UA although does have evidence of ketonuria.  Suspect improvement with addressing underlying cause, most likely combination starvation, alcoholic. Hyperglycemia was related to D5 bolus, no reason to suspect new onset diabetes.  -F/u lactic acid  Intractable nausea and vomiting Improved, no recent vomiting since admission.  Has received single dose of Zofran and Reglan.  Lipase WNL. -Consider ativan if developing further N/V  Chronic alcohol abuse Chronic alcohol abuse with binge drinking.  CIWA 0.  Does have prior history of seizures and alcohol withdrawal.  Last drink about 1 week ago.  If true, low risk of withdrawal at this time. -Monitor CIWA's, Ativan accordingly -Continue folic acid 1 mg daily, thiamine 100 mg daily, multivitamin daily  Hypokalemia K2.9 on presentation, worsened to 2.2 this a.m.  Overall received 20 mill equivalents K IV in addition to further supplementation with KCl 20 mEq/L infusion.  Refused oral K this a.m. -KCl 10 mEq IV every 1 hour x 6 -5 PM BMP  Acute systolic heart failure (HCC) Of note, history of heart failure and prior cardiac arrest.  Did have home medications of Entresto,  spironolactone, Coreg, Lasix, Wilder Glade that were held after last  admission on 01/10/2022.  These medications have not been restarted and additionally not addressed as patient no showed to establishing with PCP appointment. -Continue to hold these medications in the setting of AKI -Follow-up BNP   FEN/GI: Clear liquid diet PPx: Lovenox Dispo:Home pending clinical improvement . Barriers include continued medical workup.   Subjective:  Patient notes that for the last couple weeks she has had on and off nausea vomiting which returned last Friday and was persistent enough that she was unable to take food/drink/medications orally.  She went to the ED on Sunday but the wait was too long.  It did not improve so she came back.  She tried taking Zofran at home but was unable to keep the medication down.  Notes last drink 6 to 7 days ago but she was drinking between 10-15 shots per day.  Notes that the home situation regarding carbon oxide was fixed.  Denies recent fevers.  Denies any further nausea vomiting since admission.  Objective: Temp:  [97.7 F (36.5 C)-98.2 F (36.8 C)] 98.2 F (36.8 C) (12/20 0728) Pulse Rate:  [53-95] 86 (12/20 0728) Resp:  [16-18] 16 (12/20 0728) BP: (105-129)/(60-106) 119/88 (12/20 0728) SpO2:  [75 %-100 %] 100 % (12/20 0728) Weight:  [56.2 kg] 56.2 kg (12/19 1605) Physical Exam: General: Awake, alert, NAD, appropriately conversational Cardiovascular: RRR, no murmurs auscultated Respiratory: CTAB, normal WOB Abdomen: Soft, nontender, normoactive bowel sounds  Laboratory: Most recent CBC Lab Results  Component Value Date   WBC 11.8 (H) 01/28/2022   HGB 10.2 (L) 01/28/2022   HCT 30.8 (L) 01/28/2022   MCV 86.3 01/28/2022   PLT 362 01/28/2022   Most recent BMP    Latest Ref Rng & Units 01/28/2022    2:54 AM  BMP  Glucose 70 - 99 mg/dL 240   BUN 6 - 20 mg/dL 6   Creatinine 0.44 - 1.00 mg/dL 1.60   Sodium 135 - 145 mmol/L 137   Potassium 3.5 - 5.1 mmol/L 2.2   Chloride 98 - 111 mmol/L 92   CO2 22 - 32 mmol/L 20    Calcium 8.9 - 10.3 mg/dL 8.4    Lactic acid: > 9 > 8.7 PT/INR: 16.5/1.3 (both elevated) Salicylate level:<7 Acetaminophen level: <10 BHB: 0.75 (elevated) Magnesium 1> 1 Phosphorus 4.8 (elevated)> 2.9 (WNL) Lipase: 25 WNL UA: Ketonuria 40, protein 100, rare bacteria with WBC 6-10, glucose negative, hemoglobin negative Total CK: 65 (WNL)  Imaging/Diagnostic Tests: US Abdomen Limited RUQ (LIVER/GB)  Result Date: 01/28/2022 CLINICAL DATA:  Elevated liver enzymes EXAM: ULTRASOUND ABDOMEN LIMITED RIGHT UPPER QUADRANT COMPARISON:  CT abdomen pelvis 01/27/2022 FINDINGS: Gallbladder: No gallstones or wall thickening visualized. No sonographic Murphy sign noted by sonographer. Common bile duct: Diameter: 4 mm, within normal limits. No intrahepatic biliary ductal dilatation. Liver: No focal lesion identified. Increased and somewhat heterogeneous parenchymal echogenicity. Portal vein is patent on color Doppler imaging with normal direction of blood flow towards the liver. Other: None. IMPRESSION: 1. No cholelithiasis or sonographic evidence of cholecystitis. 2. Hepatic steatosis. Electronically Signed   By: Merilyn Baba M.D.   On: 01/28/2022 01:29   CT Renal Stone Study  Result Date: 01/27/2022 CLINICAL DATA:  Abdominal and flank pain, nausea and vomiting for 1 week EXAM: CT ABDOMEN AND PELVIS WITHOUT CONTRAST TECHNIQUE: Multidetector CT imaging of the abdomen and pelvis was performed following the standard protocol without IV contrast. RADIATION DOSE REDUCTION: This exam was performed according to  the departmental dose-optimization program which includes automated exposure control, adjustment of the mA and/or kV according to patient size and/or use of iterative reconstruction technique. COMPARISON:  01/10/2022 FINDINGS: Lower chest: No acute abnormality. Hepatobiliary: Unchanged appearance of severe hepatic steatosis, which is more focal in the vicinity of the falciform ligament (series 2, image 17).  No gallstones, gallbladder wall thickening, or biliary dilatation. Pancreas: Unremarkable. No pancreatic ductal dilatation or surrounding inflammatory changes. Spleen: Normal in size without significant abnormality. Adrenals/Urinary Tract: Adrenal glands are unremarkable. Kidneys are normal, without renal calculi, solid lesion, or hydronephrosis. Bladder is unremarkable. Stomach/Bowel: Stomach is within normal limits. Appendix is not clearly visualized and may be surgically absent. No evidence of bowel wall thickening, distention, or inflammatory changes. Vascular/Lymphatic: No significant vascular findings are present. No enlarged abdominal or pelvic lymph nodes. Reproductive: No mass or other significant abnormality. Tampon in the vagina. Other: No abdominal wall hernia or abnormality. No ascites. Musculoskeletal: No acute or significant osseous findings. IMPRESSION: 1. No acute noncontrast CT findings of the abdomen or pelvis to explain abdominal pain. No urinary tract calculi or hydronephrosis. 2. Unchanged appearance of severe hepatic steatosis, which is more focal in the vicinity of the falciform ligament. Electronically Signed   By: Delanna Ahmadi M.D.   On: 01/27/2022 19:41    Wells Guiles, DO 01/28/2022, 11:46 AM  PGY-2, [place] Intern pager: [phone], text pages welcome Secure chat group [place]

## 2022-01-29 ENCOUNTER — Other Ambulatory Visit (HOSPITAL_COMMUNITY): Payer: Self-pay

## 2022-01-29 DIAGNOSIS — R7401 Elevation of levels of liver transaminase levels: Secondary | ICD-10-CM | POA: Diagnosis not present

## 2022-01-29 LAB — CBC
HCT: 29.3 % — ABNORMAL LOW (ref 36.0–46.0)
Hemoglobin: 9.3 g/dL — ABNORMAL LOW (ref 12.0–15.0)
MCH: 28.3 pg (ref 26.0–34.0)
MCHC: 31.7 g/dL (ref 30.0–36.0)
MCV: 89.1 fL (ref 80.0–100.0)
Platelets: 258 10*3/uL (ref 150–400)
RBC: 3.29 MIL/uL — ABNORMAL LOW (ref 3.87–5.11)
RDW: 21.6 % — ABNORMAL HIGH (ref 11.5–15.5)
WBC: 5.2 10*3/uL (ref 4.0–10.5)
nRBC: 0 % (ref 0.0–0.2)

## 2022-01-29 LAB — COMPREHENSIVE METABOLIC PANEL
ALT: 92 U/L — ABNORMAL HIGH (ref 0–44)
AST: 86 U/L — ABNORMAL HIGH (ref 15–41)
Albumin: 3.2 g/dL — ABNORMAL LOW (ref 3.5–5.0)
Alkaline Phosphatase: 53 U/L (ref 38–126)
Anion gap: 9 (ref 5–15)
BUN: 6 mg/dL (ref 6–20)
CO2: 24 mmol/L (ref 22–32)
Calcium: 8.2 mg/dL — ABNORMAL LOW (ref 8.9–10.3)
Chloride: 103 mmol/L (ref 98–111)
Creatinine, Ser: 1.03 mg/dL — ABNORMAL HIGH (ref 0.44–1.00)
GFR, Estimated: >60 mL/min (ref >60)
Glucose, Bld: 75 mg/dL (ref 70–99)
Potassium: 3.7 mmol/L (ref 3.5–5.1)
Sodium: 136 mmol/L (ref 135–145)
Total Bilirubin: 0.8 mg/dL (ref 0.3–1.2)
Total Protein: 5.7 g/dL — ABNORMAL LOW (ref 6.5–8.1)

## 2022-01-29 LAB — MAGNESIUM: Magnesium: 3.2 mg/dL — ABNORMAL HIGH (ref 1.7–2.4)

## 2022-01-29 LAB — LEVETIRACETAM LEVEL: Levetiracetam Lvl: <2 ug/mL — ABNORMAL LOW (ref 10.0–40.0)

## 2022-01-29 MED ORDER — NALTREXONE HCL 50 MG PO TABS
25.0000 mg | ORAL_TABLET | Freq: Every day | ORAL | 0 refills | Status: AC
  Filled 2022-01-29: qty 15, 30d supply, fill #0

## 2022-01-29 MED ORDER — MEDROXYPROGESTERONE ACETATE 150 MG/ML IM SUSP
150.0000 mg | Freq: Once | INTRAMUSCULAR | Status: AC
  Administered 2022-01-29: 150 mg via INTRAMUSCULAR
  Filled 2022-01-29: qty 1

## 2022-01-29 MED ORDER — POTASSIUM CHLORIDE 2 MEQ/ML IV SOLN
INTRAVENOUS | Status: DC
  Filled 2022-01-29 (×3): qty 1000

## 2022-01-29 NOTE — Plan of Care (Signed)

## 2022-01-29 NOTE — Discharge Instructions (Signed)
Dear Latera Demauro,   Thank you for letting us participate in your care! In this section, you will find a brief hospital admission summary of why you were admitted to the hospital, what happened during your admission, your diagnosis/diagnoses, and recommended follow up.  Primary diagnosis: Your liver enzymes were high when you were admitted. This is most likely due to some chronic liver damage in addition to alcohol use. Treatment plan: You were treated with some fluids and your lab work improved.  POST-HOSPITAL & CARE INSTRUCTIONS We recommend following up with your PCP within 1 week from being discharged from the hospital. Please let PCP/Specialists know of any changes in medications that were made which you will be able to see in the medications section of this packet.  DOCTOR'S APPOINTMENTS & FOLLOW UP Future Appointments  Date Time Provider Department Center  02/05/2022 11:25 AM Elberta Fortis, MD [facility]  02/18/2022 11:30 AM [facility] PA/NP [facility] None     Thank you for choosing [facility]! Take care and be well!  Family Medicine Teaching Service Inpatient Team [place]  [facility]  [address] [place], [place] [address] [phone]

## 2022-01-29 NOTE — Discharge Summary (Addendum)
[facility] Discharge Summary  Patient name: Beth Barnes Medical record number: [id number] Date of birth: 1995-04-21 Age: 26 y.o. Gender: female Date of Admission: 01/27/2022  Date of Discharge: 01/29/2022 Admitting Physician: Quintella Baton, MD  Primary Care Provider: Patient, No Pcp Per Consultants: None  Indication for Hospitalization: N/V  Discharge Diagnoses/Problem List:  Principal Problem for Admission: Transaminitis Other Problems addressed during stay:  Principal Problem:   Transaminitis Active Problems:   AKI (acute kidney injury) ([place])   Acute systolic heart failure (HCC)   Hypokalemia   Chronic alcohol abuse   Dehydration   Intractable nausea and vomiting   History of seizures   Tobacco abuse   Anemia of chronic disease   QT prolongation  Brief Hospital Course:  Beth Barnes is a 26 y.o.female with a history of congenital glaucoma (legally blind), seizures, CHF, history of cardiac arrest, alcohol use disorder who was admitted to the [facility] Medicine Teaching Service at [facility] for transaminitis and lactic acidosis. Her hospital course is detailed below:  Transaminitis Presented with AST/ALT 329/184 in setting of viral symptoms x 1 week and alcohol use.  CK and alk phos are normal. RUQ ultrasound normal with the exception of hepatic steatosis. CT notable for severe hepatic steatosis. Patient was provided fluids and AST/ALT drastically improved within 24 hours. Hepatitis viral panel negative. Given her young age and relatively severe elevation in LFTs, GI consultation outpatient should be considered.  Lactic acidosis Presented with lactic acid > 9 which improved with IV hydration. Suspected to be related to hepatic causes and alcoholic and starvation ketoacidosis with decreased function of clearing lactic acid. This improved within 24 hours with IV hydration.  AKI Electrolyte abnormalities Presenting with creatinine 1.21 with baseline  0.75.  Further hypokalemia and hypomagnesemia. All suspected to be related to dehydration and chronic alcohol use.  This was monitored closely and improved on all accounts with IV hydration and supplementation.  QT prolongation QT prolongation was noted patient received Zofran and Reglan x 1.  These were discontinued and serial EKGs obtained with most recent EKG prior to discharge with normal QT interval.  Hyperglycemia Patient does have single lab of glucose greater than 200 however this was in the setting of receiving D5 bolus and there was no concern for new onset diabetes.  Acute systolic heart failure From prior admission on 01/10/2022, medications of Entresto, spironolactone, Coreg, Lasix, Wilder Glade were held. Patient has attempted to be assisted with establishing PCP but has no showed encounters.  PCP Follow-up Recommendations: GI consult Discussion of alcohol decrease/cessation -received prescription for naltrexone upon discharge Restart home medications for acute systolic heart failure/cardiac arrest: Entresto, spironolactone, Coreg, Lasix, Wilder Glade Received Depo shot, interested in obtaining IUD in future. 3 month f/u for Depo Labs to explore underlying liver disease: Alpha-1 antitrypsin, ceruloplasmin, ferritin  Disposition: Home  Discharge Condition: Stable  Discharge Exam:  Vitals:   01/29/22 0507 01/29/22 0700  BP: (!) 135/101 (!) 144/102  Pulse: 64 65  Resp: 17 17  Temp: 97.8 F (36.6 C) 98.4 F (36.9 C)  SpO2: 100% 100%   General: Well-appearing. Alert. NAD CV: RRR without murmur Pulm: CTAB. Normal WOB on RA. No wheezing Abdomen: Soft, non-tender, non-distended. +BS Ext: Well perfused. Cap refill < 3 seconds  Significant Procedures: None  Significant Labs and Imaging:  Recent Labs  Lab 01/27/22 1611 01/28/22 0044 01/28/22 0253 01/29/22 0305  WBC 12.6*  --  11.8* 5.2  HGB 11.4* 12.2 10.2* 9.3*  HCT 35.3* 36.0  30.8* 29.3*  PLT 406*  --  362 258   Recent  Labs  Lab 01/27/22 1611 01/28/22 0035 01/28/22 0044 01/28/22 0253 01/28/22 0254 01/28/22 1623 01/29/22 0305  NA 145  --  142  --  137 136 136  K 2.9*  --  2.8*  --  2.2* 3.0* 3.7  CL 93*  --   --   --  92* 101 103  CO2 18*  --   --   --  20* 27 24  GLUCOSE 73  --   --   --  240* 111* 75  BUN 10  --   --   --  _0 CREATININE 1.21*  --   --   --  1.60* 1.18* 1.03*  CALCIUM 9.7  --   --   --  8.4* 7.7* 8.2*  MG  --  1.0*  --  1.0*  --  1.0* 3.2*  PHOS  --  4.8*  --  2.9  --   --   --   ALKPHOS 75 75  --   --  67 48 53  AST 329* 404*  --   --  398* 132* 86*  ALT 184* 214*  --   --  197* 111* 92*  ALBUMIN 4.8 4.7  --   --  3.7 3.2* 3.2*    Pertinent Imaging: US Abdomen Limited RUQ (LIVER/GB) Result Date: 01/28/2022 IMPRESSION: 1. No cholelithiasis or sonographic evidence of cholecystitis. 2. Hepatic steatosis.   CT Renal Stone Study Result Date: 01/27/2022 IMPRESSION: 1. No acute noncontrast CT findings of the abdomen or pelvis to explain abdominal pain. No urinary tract calculi or hydronephrosis. 2. Unchanged appearance of severe hepatic steatosis, which is more focal in the vicinity of the falciform ligament.   Results/Tests Pending at Time of Discharge: None  Discharge Medications:  Allergies as of 01/29/2022       Reactions   Cetirizine & Related Hives   Other    Pine trees; reaction unk.        Medication List     STOP taking these medications    acetaminophen 500 MG tablet Commonly known as: TYLENOL   traZODone 50 MG tablet Commonly known as: DESYREL       TAKE these medications    dorzolamide-timolol 2-0.5 % ophthalmic solution Commonly known as: COSOPT Place 1 drop into the left eye 2 (two) times daily.   folic acid 1 MG tablet Commonly known as: FOLVITE Take 1 tablet (1 mg total) by mouth daily.   levETIRAcetam 750 MG tablet Commonly known as: KEPPRA Take 1 tablet (750 mg total) by mouth 2 (two) times daily.   naltrexone 50 MG  tablet Commonly known as: DEPADE Take 1/2 tablet (25 mg total) by mouth daily. Start taking on: January 30, 2022        Discharge Instructions: Please refer to Patient Instructions section of EMR for full details.  Patient was counseled important signs and symptoms that should prompt return to medical care, changes in medications, dietary instructions, activity restrictions, and follow up appointments.   Follow-Up Appointments:  Follow-up Information     Colletta Maryland, MD Follow up.   Specialty: Family Medicine Why: 12/28 @ 11:25AM Contact information: [place] [address] [phone]                Colletta Maryland, MD 01/29/2022, 2:37 PM PGY-1, [place] Upper-Level Resident Addendum  I have independently interviewed and examined the patient. I have discussed the above with Dr. Jerilee Hoh and agree with the documented plan. My edits for correction/addition/clarification are included above. Please see any attending notes.   Wells Guiles, DO PGY-3, [place] [facility] 01/29/2022 3:44 PM  FPTS Service pager: [phone] (text pages welcome through [facility])

## 2022-01-29 NOTE — Progress Notes (Signed)
Patient given verbal discharge instructions and verbalized understanding. PIV x1 removed, all belongings with patient. Patient also given TOC medications at discharge. Patient discharged home via LYFT>

## 2022-01-29 NOTE — Progress Notes (Signed)
FMTS Brief Progress Note - late entry  S: Night rounding.  Patient found laying in bed comfortably, tech at bedside had just taken EKG as ordered.  She says she feels much better with the IV hydration.  Only complaint is that IV and hand is uncomfortable.  O: BP (!) 139/104 (BP Location: Left Arm)   Pulse 67   Temp 97.8 F (36.6 C) (Oral)   Resp 18   Ht 5\' 3"  (1.6 m)   Wt 56.2 kg   SpO2 100%   BMI 21.95 kg/m   General: Awake, alert, NAD, laying in right lateral decubitus position Respiratory: Speaking clearly Cardiac: Regular radial pulse, skin warm to touch Extremities: IV in right hand unremarkable, no effusion or erythema noted at site  A/P: Lactic acidosis, nausea and vomiting: Improved with IV hydration.  Plan per day team, no changes at this time.  Alcohol abuse: Will continue to monitor CIWA's.   , MD 01/29/2022, 1:19 AM PGY-3, [place] [facility] Night Resident  Please page [phone] with questions.

## 2022-02-05 ENCOUNTER — Telehealth (HOSPITAL_COMMUNITY): Payer: Self-pay | Admitting: Emergency Medicine

## 2022-02-05 ENCOUNTER — Ambulatory Visit: Payer: Self-pay | Admitting: Family Medicine

## 2022-02-05 NOTE — Telephone Encounter (Signed)
Beth Barnes called me back.  She told me that her boyfriend is kicking her out of the house.  She has an appointment today @ 11:30 @ [facility].  She is going to reach out to her mother for money so that she can get an Benedetto Goad to her appointment.  We had an 18 minute conversation about what she wants to do in regards to moving to [place] to live with her mother or stay here.  She indicated that she would rather stay here but has limited income monthly.  First things first, I told her to get on with whatever needs to happen to make her appointment today and perhaps she could move back in with her friend [name] for a couple weeks or until she can find her own place if she decides to stay here.  She says [facility] that she wants to keep and I encouraged her to do that. I told her I would reach out to [place] to find further assistance.    [name], EMT-Paramedic [phone] 02/05/2022

## 2022-02-05 NOTE — Telephone Encounter (Signed)
Called to schedule visit.  No answer.  VM full unable to leave msg.    [name], EMT-Paramedic [phone] 02/05/2022

## 2022-02-05 NOTE — Progress Notes (Deleted)
Subjective:  Patient ID: Beth Barnes, female    DOB: 06-19-1995, 26 y.o.   MRN: [id number]  CC: New Patient, hospital follow up  HPI:  Beth Barnes is a 26 y.o. female who presents today to establish care.  Patient has hospitalized from 12/19-12/21 for transaminitis associated with Ct confirmed severe hepatic steatosis. She also presented with AKI and electrolyte derangements likely secondary to dehydration and chronic alcohol use.  PCP Follow-up Recommendations: GI consult Discussion of alcohol decrease/cessation -received prescription for naltrexone upon discharge Restart home medications for acute systolic heart failure/cardiac arrest: Entresto, spironolactone, Coreg, Lasix, Marcelline Deist Received Depo shot, interested in obtaining IUD in future. 3 month f/u for Depo Labs to explore underlying liver disease: Alpha-1 antitrypsin, ceruloplasmin, ferritin (transferrin), ANA, anti-smooth muscle, AMA   PMHx: Past Medical History:  Diagnosis Date   Blind    Congenital glaucoma of both eyes    Hypokalemia    Seizures (HCC)     Surgical Hx: Past Surgical History:  Procedure Laterality Date   EYE SURGERY      Family Hx: Family History  Problem Relation Age of Onset   Healthy Mother    Healthy Father     Social Hx: Current Social History   Who lives at home: *** 02/05/2022  Who would speak for you about health care matters: *** 02/05/2022  Transportation: *** 02/05/2022 Important Relationships & Pets: *** 02/05/2022  Current Stressors: *** 02/05/2022 Work / Education:  *** 02/05/2022 Religious / Personal Beliefs: *** 02/05/2022 Interests / Fun: *** 02/05/2022 Other: *** 02/05/2022   Medications:   ROS: Woman:  Patient reports no  vision/ hearing changes,anorexia, weight change, fever ,adenopathy, persistant / recurrent hoarseness, swallowing issues, chest pain, edema,persistant / recurrent cough, hemoptysis, dyspnea(rest, exertional, paroxysmal nocturnal),  gastrointestinal  bleeding (melena, rectal bleeding), abdominal pain, excessive heart burn, GU symptoms(dysuria, hematuria, pyuria, voiding/incontinence  Issues) syncope, focal weakness, severe memory loss, concerning skin lesions, depression, anxiety, abnormal bruising/bleeding, major joint swelling, breast masses or abnormal vaginal bleeding.    Man:  Patient reports no  vision/ hearing changes,anorexia, weight change, fever ,adenopathy, persistant / recurrent hoarseness, swallowing issues, chest pain, edema,persistant / recurrent cough, hemoptysis, dyspnea(rest, exertional, paroxysmal nocturnal), gastrointestinal  bleeding (melena, rectal bleeding), abdominal pain, excessive heart burn, GU symptoms(dysuria, hematuria, pyuria, voiding/incontinence  Issues) syncope, focal weakness, severe memory loss, concerning skin lesions, depression, anxiety, abnormal bruising/bleeding, major joint swelling.    Preventative Screening Colonoscopy: year*** results *** Mammogram: year*** results *** Pap test: year*** results *** PSA: year*** results *** DEXA: year*** results *** Tetanus vaccine: year*** results *** Pneumonia vaccine: year*** results *** Shingles vaccine: year*** results *** Heart stress test: year*** results *** Echocardiogram: year*** results *** Xrays: year*** results *** CT/MRI: year*** results ***  Smoking status reviewed  ROS: pertinent noted in the HPI    Objective:  There were no vitals taken for this visit. Vitals and nursing note reviewed  General: NAD, pleasant, able to participate in exam HEENT: normocephalic, TM's visualized bilaterally, no scleral icterus or conjunctival pallor, no nasal discharge, moist mucous membranes, good dentition without erythema or discharge noted in posterior oropharynx Neck: supple, non-tender, without lymphadenopathy Cardiac: RRR, S1 S2 present. normal heart sounds, no murmurs. Respiratory: CTAB, normal effort, No wheezes, rales or  rhonchi Abdomen: Normoactive bowel sounds, non-tender, non-distended, no hepatosplenomegaly Extremities: no edema or cyanosis. Skin: warm and dry, no rashes noted Neuro: alert, no obvious focal deficits Psych: Normal affect and mood  Assessment & Plan:  No problem-specific Assessment & Plan  notes found for this encounter.   No orders of the defined types were placed in this encounter.  No orders of the defined types were placed in this encounter.  No follow-ups on file. Elberta Fortis, MD 02/05/2022, 8:27 AM PGY-1, [facility]

## 2022-02-18 ENCOUNTER — Telehealth (HOSPITAL_COMMUNITY): Payer: Self-pay | Admitting: Emergency Medicine

## 2022-02-18 ENCOUNTER — Inpatient Hospital Stay (HOSPITAL_COMMUNITY): Admission: RE | Admit: 2022-02-18 | Payer: Medicaid Other | Source: Ambulatory Visit

## 2022-02-18 ENCOUNTER — Telehealth (HOSPITAL_COMMUNITY): Payer: Self-pay | Admitting: Family Medicine

## 2022-02-18 NOTE — Telephone Encounter (Signed)
Made 5 separate calls to [place] today with no answer and unable to LVM.  Trying to follow up with today's scheduled clinic visit that she missed.    [name], [place] 02/18/2022

## 2022-02-18 NOTE — Telephone Encounter (Signed)
Called to remind her of appointment tomorrow.  Unable to leave voicemail.    [name], [place] 02/18/2022

## 2022-02-18 NOTE — Telephone Encounter (Signed)
No show letter mailed (1)

## 2022-02-19 ENCOUNTER — Telehealth (HOSPITAL_COMMUNITY): Payer: Self-pay | Admitting: Emergency Medicine

## 2022-02-19 NOTE — Telephone Encounter (Signed)
Attempted to contact Beth Barnes w/o success.  Reached out to Beth Barnes's mother who told me she passed away on February 25, 2022 and was buried Jan. 9th in [place].    Discharged,.    [name], [place] 02/19/2022

## 2022-02-19 NOTE — Telephone Encounter (Signed)
Called w/ no answer.  Unable to leave VM.    [name], [place] 02/19/2022

## 2022-03-12 DEATH — deceased

## 2022-09-29 IMAGING — MR MR CARD MORPHOLOGY WO/W CM
45 of 48 series · 45 of 48 positions shown · IV contrast (gadavist)
Comparison: none

CLINICAL DATA: Sudden Cardiac Death/ Cardiomyopathy

EXAM:
CARDIAC MRI
TECHNIQUE: The patient was scanned on a 1.5 Tesla Siemens magnet. A dedicated
cardiac coil was used. Functional imaging was done using Fiesta
sequences. [DATE], and 4 chamber views were done to assess for RWMA's.
Modified Bongjae rule using a short axis stack was used to
calculate an ejection fraction on a dedicated work station using
Circle software. The patient received 8 cc of Gadavist. After 10
minutes inversion recovery sequences were used to assess for
infiltration and scar tissue.
CONTRAST:  Gadavist

[Series 4: t2_haste_db_tra_bh · axial · 8.0mm · 1.41mm/px · 1 of 16 slices shown]
[im 1/16]
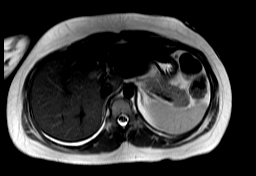

[Series 8: bSSFP · oblique · 8.0mm · 1.61mm/px · 1 of 25 slices shown (1 of 23)]
[im 1/25]
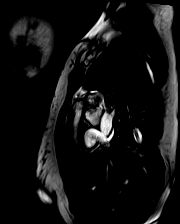

[Series 9: bSSFP · oblique · 8.0mm · 1.61mm/px · 1 of 25 slices shown (2 of 23)]
[im 1/25]
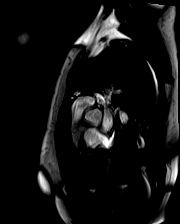

[Series 10: bSSFP · oblique · 8.0mm · 1.61mm/px · 1 of 25 slices shown (3 of 23)]
[im 1/25]
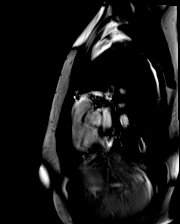

[Series 11: bSSFP · oblique · 8.0mm · 1.61mm/px · 1 of 25 slices shown (4 of 23)]
[im 1/25]
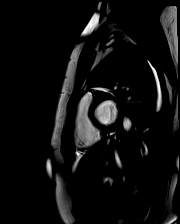

[Series 12: bSSFP · oblique · 8.0mm · 1.61mm/px · 1 of 25 slices shown (5 of 23)]
[im 1/25]
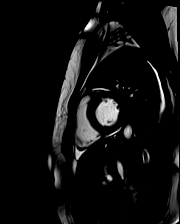

[Series 13: bSSFP · oblique · 8.0mm · 1.61mm/px · 1 of 25 slices shown (6 of 23)]
[im 1/25]
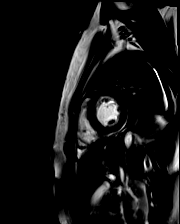

[Series 14: bSSFP · oblique · 8.0mm · 1.61mm/px · 1 of 25 slices shown (7 of 23)]
[im 1/25]
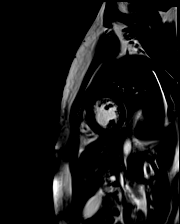

[Series 15: bSSFP · oblique · 8.0mm · 1.61mm/px · 1 of 25 slices shown (8 of 23)]
[im 1/25]
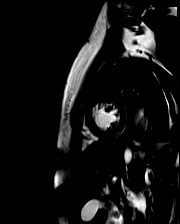

[Series 16: bSSFP · oblique · 8.0mm · 1.61mm/px · 1 of 25 slices shown (9 of 23)]
[im 1/25]
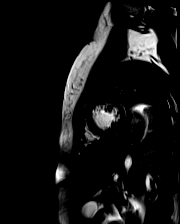

[Series 17: bSSFP · oblique · 8.0mm · 1.61mm/px · 1 of 25 slices shown (10 of 23)]
[im 1/25]
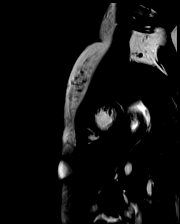

[Series 18: bSSFP · oblique · 8.0mm · 1.61mm/px · 1 of 25 slices shown (11 of 23)]
[im 1/25]
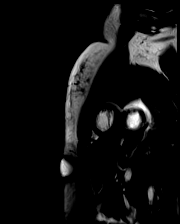

[Series 19: bSSFP · oblique · 8.0mm · 1.61mm/px · 1 of 25 slices shown (12 of 23)]
[im 1/25]
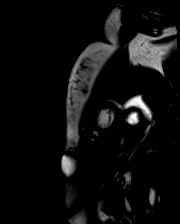

[Series 20: bSSFP · oblique · 8.0mm · 1.61mm/px · 1 of 25 slices shown (13 of 23)]
[im 1/25]
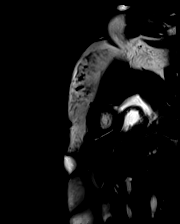

[Series 21: bSSFP · oblique · 8.0mm · 1.61mm/px · 1 of 25 slices shown (14 of 23)]
[im 1/25]
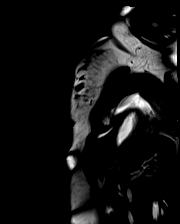

[Series 22: bSSFP · oblique · 8.0mm · 1.61mm/px · 1 of 25 slices shown (15 of 23)]
[im 1/25]
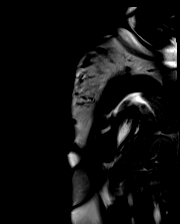

[Series 23: bSSFP · oblique · 8.0mm · 1.61mm/px · 1 of 25 slices shown (16 of 23)]
[im 1/25]
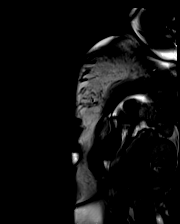

[Series 24: bSSFP · oblique · 6.0mm · 1.41mm/px · 1 of 25 slices shown (17 of 23)]
[im 1/25]
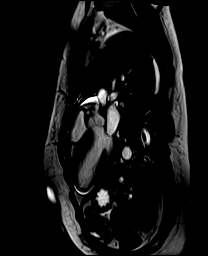

[Series 25: bSSFP · oblique · 6.0mm · 1.41mm/px · 1 of 25 slices shown (18 of 23)]
[im 1/25]
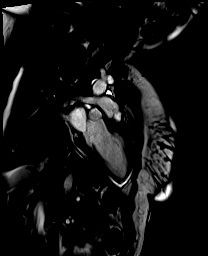

[Series 26: bSSFP · oblique · 6.0mm · 1.41mm/px · 1 of 25 slices shown (19 of 23)]
[im 1/25]
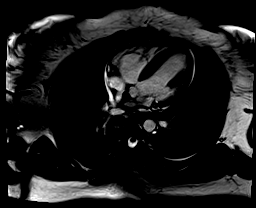

[Series 27: bSSFP · oblique · 6.0mm · 1.41mm/px · 1 of 25 slices shown (20 of 23)]
[im 1/25]
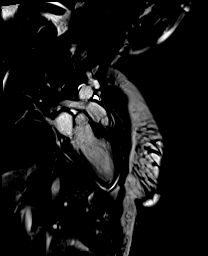

[Series 28: bSSFP · oblique · 6.0mm · 1.41mm/px · 1 of 25 slices shown (21 of 23)]
[im 1/25]
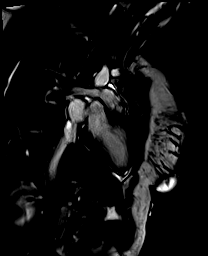

[Series 29: bSSFP · oblique · 6.0mm · 1.41mm/px · 1 of 25 slices shown (22 of 23)]
[im 1/25]
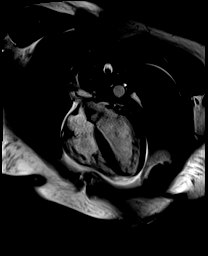

[Series 30: cine_trufi_short axis_cs_2_shot · oblique · 8.0mm · 1.48mm/px · 1 of 25 slices shown (1 of 17)]
[im 1/25]
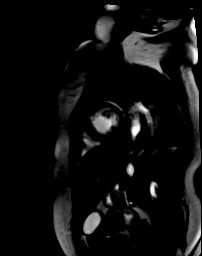

[Series 30: cine_trufi_short axis_cs_2_shot · oblique · 8.0mm · 1.48mm/px · 1 of 25 slices shown (2 of 17)]
[im 1/25]
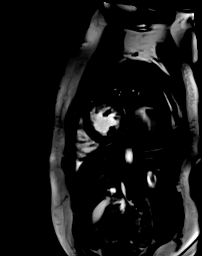

[Series 30: cine_trufi_short axis_cs_2_shot · oblique · 8.0mm · 1.48mm/px · 1 of 25 slices shown (3 of 17)]
[im 1/25]
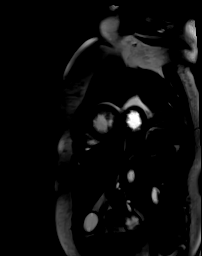

[Series 30: cine_trufi_short axis_cs_2_shot · oblique · 8.0mm · 1.48mm/px · 1 of 25 slices shown (4 of 17)]
[im 1/25]
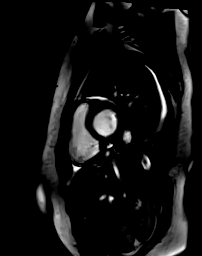

[Series 30: cine_trufi_short axis_cs_2_shot · oblique · 8.0mm · 1.48mm/px · 1 of 25 slices shown (5 of 17)]
[im 1/25]
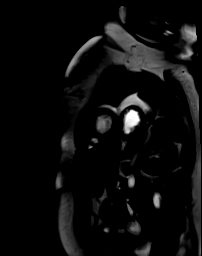

[Series 30: cine_trufi_short axis_cs_2_shot · oblique · 8.0mm · 1.48mm/px · 1 of 25 slices shown (6 of 17)]
[im 1/25]
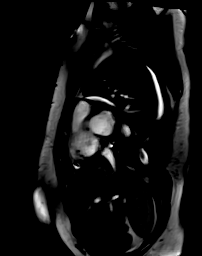

[Series 30: cine_trufi_short axis_cs_2_shot · oblique · 8.0mm · 1.48mm/px · 1 of 25 slices shown (7 of 17)]
[im 1/25]
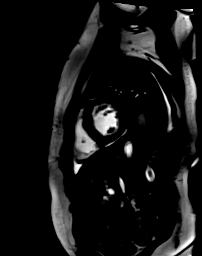

[Series 30: cine_trufi_short axis_cs_2_shot · oblique · 8.0mm · 1.48mm/px · 1 of 25 slices shown (8 of 17)]
[im 1/25]
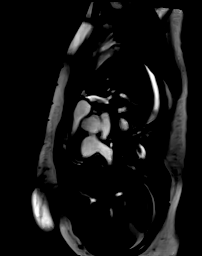

[Series 30: cine_trufi_short axis_cs_2_shot · oblique · 8.0mm · 1.48mm/px · 1 of 25 slices shown (9 of 17)]
[im 1/25]
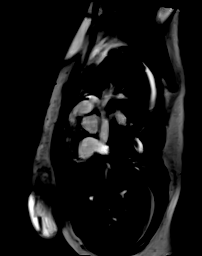

[Series 30: cine_trufi_short axis_cs_2_shot · oblique · 8.0mm · 1.48mm/px · 1 of 25 slices shown (10 of 17)]
[im 1/25]
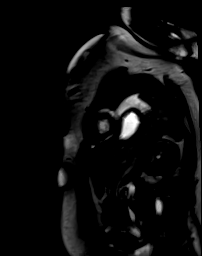

[Series 30: cine_trufi_short axis_cs_2_shot · oblique · 8.0mm · 1.48mm/px · 1 of 25 slices shown (11 of 17)]
[im 1/25]
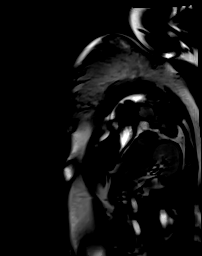

[Series 30: cine_trufi_short axis_cs_2_shot · oblique · 8.0mm · 1.48mm/px · 1 of 25 slices shown (12 of 17)]
[im 1/25]
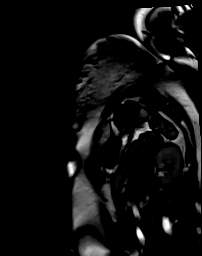

[Series 30: cine_trufi_short axis_cs_2_shot · oblique · 8.0mm · 1.48mm/px · 1 of 25 slices shown (13 of 17)]
[im 1/25]
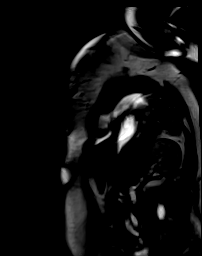

[Series 30: cine_trufi_short axis_cs_2_shot · oblique · 8.0mm · 1.48mm/px · 1 of 25 slices shown (14 of 17)]
[im 1/25]
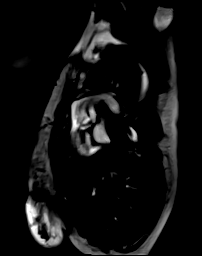

[Series 30: cine_trufi_short axis_cs_2_shot · oblique · 8.0mm · 1.48mm/px · 1 of 25 slices shown (15 of 17)]
[im 1/25]
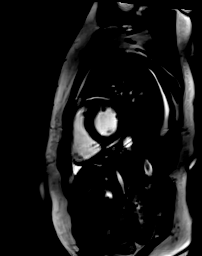

[Series 30: cine_trufi_short axis_cs_2_shot · oblique · 8.0mm · 1.48mm/px · 1 of 25 slices shown (16 of 17)]
[im 1/25]
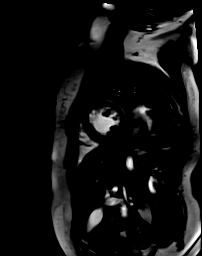

[Series 30: cine_trufi_short axis_cs_2_shot · oblique · 8.0mm · 1.48mm/px · 1 of 25 slices shown (17 of 17)]
[im 1/25]
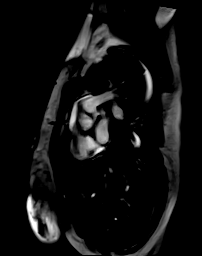

[Series 31: bSSFP · oblique · 6.0mm · 1.41mm/px · 1 of 25 slices shown (23 of 23)]
[im 1/25]
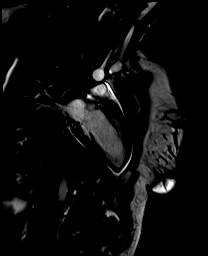

[Series 32: (id)_long_t1 · oblique · 8.0mm · 1.56mm/px · 1 of 24 slices shown]
[im 1/24]
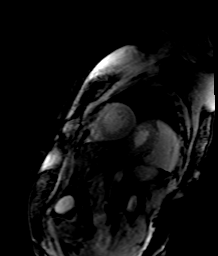

[Series 33: (id)_long_t1_moco · oblique · 8.0mm · 1.56mm/px · 1 of 24 slices shown]
[im 1/24]
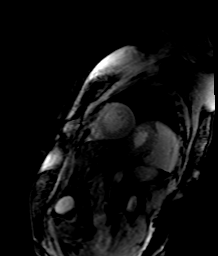

[Series 34: (id)_long_t1_moco_t1 · oblique · 8.0mm · 1.56mm/px · 1 of 6 slices shown]
[im 1/6]
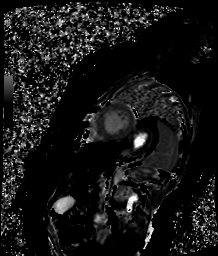

[Series 36: (id)_trufi · oblique · 8.0mm · 2.08mm/px · 1 of 9 slices shown]
[im 1/9]
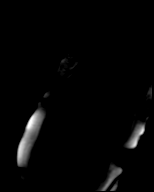

[45 of 48 positions shown; findings below may reference images not displayed]

FINDINGS: Normal atrial sizes. No ASD/PFO. Small ascending thoracic aorta
cm Small nearly circumferential pericardial effusion. Normal cardiac
valves. Tri leaflet AV

Normal RV size and function. Mild LVE. Mild global hypokinesis. No
defined RWMAls. Quantitative EF 42% (EDV 89 cc ESV 52 cc SV 37 cc)
Delayed enhancement images show non specific mild stripe of mid
myocardial uptake diffusely

Normal parametric findings

T2 55 msec

T1 7727 msec

ECV 31%
IMPRESSION: 1. Mild septal hypertrophy 14 mm with mild global hypokinesis EF 42%

2. Nonspecific mid myocardial gadolinium uptake on delayed inversion
recovery sequences

3. Normal parametric measures no evidence of myocarditis or
infiltration or edema

4.  Normal RV size and function

5.  Normal cardiac valves

6.  Small but nearly circumferential pericardial effusion

Blain Jumper
